# Patient Record
Sex: Female | Born: 1945
Health system: Southern US, Community
[De-identification: ages and names within clinical notes are randomized; demographics above are authoritative.]

## PROBLEM LIST (undated history)

## (undated) DIAGNOSIS — C4400 Unspecified malignant neoplasm of skin of lip: Secondary | ICD-10-CM

## (undated) DIAGNOSIS — Z86010 Personal history of colonic polyps: Principal | ICD-10-CM

## (undated) DIAGNOSIS — K589 Irritable bowel syndrome without diarrhea: Secondary | ICD-10-CM

## (undated) DIAGNOSIS — E785 Hyperlipidemia, unspecified: Secondary | ICD-10-CM

## (undated) DIAGNOSIS — N200 Calculus of kidney: Secondary | ICD-10-CM

## (undated) DIAGNOSIS — E119 Type 2 diabetes mellitus without complications: Secondary | ICD-10-CM

## (undated) DIAGNOSIS — M1990 Unspecified osteoarthritis, unspecified site: Secondary | ICD-10-CM

## (undated) DIAGNOSIS — T7840XA Allergy, unspecified, initial encounter: Secondary | ICD-10-CM

## (undated) DIAGNOSIS — I868 Varicose veins of other specified sites: Secondary | ICD-10-CM

## (undated) DIAGNOSIS — Z87442 Personal history of urinary calculi: Secondary | ICD-10-CM

## (undated) DIAGNOSIS — D649 Anemia, unspecified: Secondary | ICD-10-CM

## (undated) DIAGNOSIS — I10 Essential (primary) hypertension: Secondary | ICD-10-CM

## (undated) DIAGNOSIS — D219 Benign neoplasm of connective and other soft tissue, unspecified: Secondary | ICD-10-CM

## (undated) DIAGNOSIS — R0600 Dyspnea, unspecified: Secondary | ICD-10-CM

## (undated) DIAGNOSIS — K219 Gastro-esophageal reflux disease without esophagitis: Secondary | ICD-10-CM

## (undated) HISTORY — PX: TOOTH EXTRACTION: SUR596

## (undated) HISTORY — DX: Benign neoplasm of connective and other soft tissue, unspecified: D21.9

## (undated) HISTORY — DX: Personal history of colonic polyps: Z86.010

## (undated) HISTORY — DX: Irritable bowel syndrome, unspecified: K58.9

## (undated) HISTORY — DX: Allergy, unspecified, initial encounter: T78.40XA

## (undated) HISTORY — DX: Unspecified malignant neoplasm of skin of lip: C44.00

## (undated) HISTORY — DX: Calculus of kidney: N20.0

## (undated) HISTORY — DX: Gastro-esophageal reflux disease without esophagitis: K21.9

## (undated) HISTORY — DX: Essential (primary) hypertension: I10

## (undated) HISTORY — DX: Hyperlipidemia, unspecified: E78.5

## (undated) HISTORY — PX: TONSILLECTOMY: SUR1361

## (undated) HISTORY — DX: Varicose veins of other specified sites: I86.8

## (undated) HISTORY — DX: Type 2 diabetes mellitus without complications: E11.9

---

## 2000-01-02 HISTORY — PX: ENDOMETRIAL BIOPSY: SHX622

## 2003-10-02 ENCOUNTER — Other Ambulatory Visit: Admission: RE | Admit: 2003-10-02 | Discharge: 2003-10-02 | Payer: Self-pay | Admitting: Family Medicine

## 2004-04-11 ENCOUNTER — Ambulatory Visit: Payer: Self-pay | Admitting: Family Medicine

## 2004-04-18 ENCOUNTER — Ambulatory Visit: Payer: Self-pay | Admitting: Family Medicine

## 2004-05-23 ENCOUNTER — Ambulatory Visit: Payer: Self-pay | Admitting: Family Medicine

## 2004-06-23 ENCOUNTER — Ambulatory Visit: Payer: Self-pay | Admitting: Family Medicine

## 2004-07-22 ENCOUNTER — Ambulatory Visit: Payer: Self-pay | Admitting: Family Medicine

## 2005-02-03 ENCOUNTER — Ambulatory Visit: Payer: Self-pay | Admitting: Family Medicine

## 2005-09-05 ENCOUNTER — Ambulatory Visit: Payer: Self-pay | Admitting: Family Medicine

## 2005-09-07 ENCOUNTER — Ambulatory Visit: Payer: Self-pay | Admitting: Family Medicine

## 2005-11-06 ENCOUNTER — Encounter: Payer: Self-pay | Admitting: Family Medicine

## 2005-11-06 ENCOUNTER — Ambulatory Visit: Payer: Self-pay | Admitting: Family Medicine

## 2005-11-06 ENCOUNTER — Other Ambulatory Visit: Admission: RE | Admit: 2005-11-06 | Discharge: 2005-11-06 | Payer: Self-pay | Admitting: Family Medicine

## 2005-11-12 ENCOUNTER — Encounter: Payer: Self-pay | Admitting: Family Medicine

## 2005-11-12 LAB — CONVERTED CEMR LAB: Pap Smear: NORMAL

## 2005-11-28 ENCOUNTER — Ambulatory Visit: Payer: Self-pay | Admitting: Family Medicine

## 2005-12-02 HISTORY — PX: COLONOSCOPY: SHX174

## 2005-12-12 ENCOUNTER — Ambulatory Visit: Payer: Self-pay | Admitting: Internal Medicine

## 2005-12-19 ENCOUNTER — Ambulatory Visit: Payer: Self-pay | Admitting: Family Medicine

## 2005-12-20 ENCOUNTER — Ambulatory Visit: Payer: Self-pay | Admitting: Family Medicine

## 2005-12-22 ENCOUNTER — Ambulatory Visit: Payer: Self-pay | Admitting: Family Medicine

## 2005-12-26 ENCOUNTER — Ambulatory Visit: Payer: Self-pay | Admitting: Internal Medicine

## 2005-12-26 LAB — HM COLONOSCOPY: HM Colonoscopy: NORMAL

## 2006-02-20 ENCOUNTER — Ambulatory Visit: Payer: Self-pay | Admitting: Family Medicine

## 2006-06-20 ENCOUNTER — Encounter: Payer: Self-pay | Admitting: Family Medicine

## 2006-07-12 ENCOUNTER — Encounter: Payer: Self-pay | Admitting: Family Medicine

## 2006-07-12 DIAGNOSIS — I868 Varicose veins of other specified sites: Secondary | ICD-10-CM | POA: Insufficient documentation

## 2006-07-12 DIAGNOSIS — K219 Gastro-esophageal reflux disease without esophagitis: Secondary | ICD-10-CM

## 2006-07-12 DIAGNOSIS — I10 Essential (primary) hypertension: Secondary | ICD-10-CM | POA: Insufficient documentation

## 2006-07-12 DIAGNOSIS — J309 Allergic rhinitis, unspecified: Secondary | ICD-10-CM | POA: Insufficient documentation

## 2006-07-12 DIAGNOSIS — K589 Irritable bowel syndrome without diarrhea: Secondary | ICD-10-CM

## 2006-07-12 DIAGNOSIS — E785 Hyperlipidemia, unspecified: Secondary | ICD-10-CM | POA: Insufficient documentation

## 2006-09-07 ENCOUNTER — Encounter: Payer: Self-pay | Admitting: Family Medicine

## 2006-09-10 ENCOUNTER — Encounter: Payer: Self-pay | Admitting: Family Medicine

## 2006-09-10 ENCOUNTER — Ambulatory Visit: Payer: Self-pay | Admitting: Family Medicine

## 2006-10-23 ENCOUNTER — Telehealth: Payer: Self-pay | Admitting: Family Medicine

## 2006-10-23 ENCOUNTER — Ambulatory Visit: Payer: Self-pay | Admitting: Family Medicine

## 2006-10-23 LAB — CONVERTED CEMR LAB
Bacteria, UA: 0
Casts: 0 /lpf
Glucose, Urine, Semiquant: NEGATIVE
Ketones, urine, test strip: NEGATIVE
Mucus, UA: 0
Nitrite: NEGATIVE
Protein, U semiquant: NEGATIVE
RBC / HPF: 1
Specific Gravity, Urine: <1.005
Urobilinogen, UA: 0.2
WBC, UA: 0 cells/hpf
Yeast, UA: 0
pH: 6

## 2006-12-04 ENCOUNTER — Ambulatory Visit: Payer: Self-pay | Admitting: Family Medicine

## 2006-12-04 DIAGNOSIS — E1165 Type 2 diabetes mellitus with hyperglycemia: Secondary | ICD-10-CM | POA: Insufficient documentation

## 2006-12-04 DIAGNOSIS — E119 Type 2 diabetes mellitus without complications: Secondary | ICD-10-CM | POA: Insufficient documentation

## 2006-12-05 ENCOUNTER — Ambulatory Visit: Payer: Self-pay | Admitting: Family Medicine

## 2006-12-10 LAB — CONVERTED CEMR LAB
ALT: 20 units/L (ref 0–35)
AST: 16 units/L (ref 0–37)
Albumin: 3.7 g/dL (ref 3.5–5.2)
Basophils Absolute: 0 10*3/uL (ref 0.0–0.1)
Calcium: 9 mg/dL (ref 8.4–10.5)
Chloride: 106 meq/L (ref 96–112)
Cholesterol: 166 mg/dL (ref 0–200)
Creatinine,U: 50.8 mg/dL
Eosinophils Absolute: 0.2 10*3/uL (ref 0.0–0.6)
GFR calc Af Amer: 94 mL/min
GFR calc non Af Amer: 78 mL/min
HDL: 46.8 mg/dL (ref >39.0)
Hgb A1c MFr Bld: 7.4 % — ABNORMAL HIGH (ref 4.6–6.0)
MCHC: 34.1 g/dL (ref 30.0–36.0)
MCV: 87 fL (ref 78.0–100.0)
Microalb Creat Ratio: 13.8 mg/g (ref 0.0–30.0)
Neutro Abs: 4.5 10*3/uL (ref 1.4–7.7)
Platelets: 335 10*3/uL (ref 150–400)
RBC: 4.4 M/uL (ref 3.87–5.11)
Sodium: 141 meq/L (ref 135–145)
TSH: 1.42 microintl units/mL (ref 0.35–5.50)
Total CHOL/HDL Ratio: 3.5
Triglycerides: 114 mg/dL (ref 0–149)

## 2007-02-05 ENCOUNTER — Ambulatory Visit: Payer: Self-pay | Admitting: Family Medicine

## 2007-05-06 ENCOUNTER — Ambulatory Visit: Payer: Self-pay | Admitting: Family Medicine

## 2007-05-07 ENCOUNTER — Telehealth (INDEPENDENT_AMBULATORY_CARE_PROVIDER_SITE_OTHER): Payer: Self-pay | Admitting: Internal Medicine

## 2007-05-14 ENCOUNTER — Ambulatory Visit: Payer: Self-pay | Admitting: Family Medicine

## 2007-05-16 ENCOUNTER — Ambulatory Visit: Payer: Self-pay | Admitting: Family Medicine

## 2007-05-16 LAB — CONVERTED CEMR LAB
ALT: 21 units/L (ref 0–35)
AST: 20 units/L (ref 0–37)
HDL: 49 mg/dL (ref >39)
Hgb A1c MFr Bld: 7.6 % — ABNORMAL HIGH (ref 4.6–6.1)

## 2007-07-03 ENCOUNTER — Encounter (INDEPENDENT_AMBULATORY_CARE_PROVIDER_SITE_OTHER): Payer: Self-pay | Admitting: *Deleted

## 2007-08-14 ENCOUNTER — Ambulatory Visit: Payer: Self-pay | Admitting: Family Medicine

## 2007-08-16 LAB — CONVERTED CEMR LAB
ALT: 25 units/L (ref 0–35)
AST: 20 units/L (ref 0–37)
Cholesterol: 178 mg/dL (ref 0–200)
LDL Cholesterol: 113 mg/dL — ABNORMAL HIGH (ref 0–99)
Triglycerides: 97 mg/dL (ref 0–149)

## 2007-08-28 ENCOUNTER — Ambulatory Visit: Payer: Self-pay | Admitting: Family Medicine

## 2007-10-16 ENCOUNTER — Ambulatory Visit: Payer: Self-pay | Admitting: Family Medicine

## 2007-10-21 LAB — CONVERTED CEMR LAB
Albumin: 4.5 g/dL (ref 3.5–5.2)
BUN: 21 mg/dL (ref 6–23)
CO2: 21 meq/L (ref 19–32)
Calcium: 9.4 mg/dL (ref 8.4–10.5)
Cholesterol: 164 mg/dL (ref 0–200)
Creatinine, Ser: 0.72 mg/dL (ref 0.40–1.20)
Glucose, Bld: 163 mg/dL — ABNORMAL HIGH (ref 70–99)
HDL: 48 mg/dL (ref >39)
Total CHOL/HDL Ratio: 3.4
Triglycerides: 106 mg/dL (ref <150)

## 2007-11-27 ENCOUNTER — Ambulatory Visit: Payer: Self-pay | Admitting: Family Medicine

## 2007-11-28 ENCOUNTER — Ambulatory Visit: Payer: Self-pay | Admitting: Family Medicine

## 2007-11-28 ENCOUNTER — Encounter: Payer: Self-pay | Admitting: Family Medicine

## 2007-12-02 ENCOUNTER — Encounter (INDEPENDENT_AMBULATORY_CARE_PROVIDER_SITE_OTHER): Payer: Self-pay | Admitting: *Deleted

## 2007-12-30 ENCOUNTER — Ambulatory Visit: Payer: Self-pay | Admitting: Family Medicine

## 2008-02-12 LAB — CONVERTED CEMR LAB
Albumin: 3.9 g/dL (ref 3.5–5.2)
CO2: 27 meq/L (ref 19–32)
Calcium: 9.3 mg/dL (ref 8.4–10.5)
Creatinine, Ser: 0.7 mg/dL (ref 0.4–1.2)
GFR calc Af Amer: 109 mL/min
GFR calc non Af Amer: 90 mL/min
HDL: 43.4 mg/dL (ref >39.0)
Sodium: 139 meq/L (ref 135–145)
Total CHOL/HDL Ratio: 3.5
Triglycerides: 86 mg/dL (ref 0–149)
VLDL: 17 mg/dL (ref 0–40)

## 2008-02-13 ENCOUNTER — Ambulatory Visit: Payer: Self-pay | Admitting: Family Medicine

## 2008-02-14 LAB — CONVERTED CEMR LAB
BUN: 18 mg/dL (ref 6–23)
Calcium: 9.3 mg/dL (ref 8.4–10.5)
Chloride: 105 meq/L (ref 96–112)
Cholesterol: 151 mg/dL (ref 0–200)
Creatinine, Ser: 0.6 mg/dL (ref 0.4–1.2)
GFR calc Af Amer: 131 mL/min
LDL Cholesterol: 90 mg/dL (ref 0–99)
Phosphorus: 4 mg/dL (ref 2.3–4.6)
Potassium: 4.4 meq/L (ref 3.5–5.1)
Total CHOL/HDL Ratio: 3.5
Triglycerides: 86 mg/dL (ref 0–149)
VLDL: 17 mg/dL (ref 0–40)

## 2008-02-20 ENCOUNTER — Other Ambulatory Visit: Admission: RE | Admit: 2008-02-20 | Discharge: 2008-02-20 | Payer: Self-pay | Admitting: Family Medicine

## 2008-02-20 ENCOUNTER — Ambulatory Visit: Payer: Self-pay | Admitting: Family Medicine

## 2008-02-20 ENCOUNTER — Encounter: Payer: Self-pay | Admitting: Family Medicine

## 2008-04-24 ENCOUNTER — Ambulatory Visit: Payer: Self-pay | Admitting: Family Medicine

## 2008-06-16 ENCOUNTER — Ambulatory Visit: Payer: Self-pay | Admitting: Family Medicine

## 2008-06-16 DIAGNOSIS — J019 Acute sinusitis, unspecified: Secondary | ICD-10-CM

## 2008-07-01 ENCOUNTER — Telehealth: Payer: Self-pay | Admitting: Family Medicine

## 2008-07-28 ENCOUNTER — Encounter: Payer: Self-pay | Admitting: Family Medicine

## 2008-10-13 ENCOUNTER — Ambulatory Visit: Payer: Self-pay | Admitting: Family Medicine

## 2008-12-08 ENCOUNTER — Ambulatory Visit: Payer: Self-pay | Admitting: Family Medicine

## 2008-12-08 ENCOUNTER — Encounter: Payer: Self-pay | Admitting: Family Medicine

## 2008-12-10 ENCOUNTER — Encounter (INDEPENDENT_AMBULATORY_CARE_PROVIDER_SITE_OTHER): Payer: Self-pay | Admitting: *Deleted

## 2008-12-18 ENCOUNTER — Encounter: Payer: Self-pay | Admitting: Family Medicine

## 2008-12-24 ENCOUNTER — Ambulatory Visit: Payer: Self-pay | Admitting: Family Medicine

## 2008-12-24 LAB — CONVERTED CEMR LAB
ALT: 22 units/L (ref 0–35)
BUN: 18 mg/dL (ref 6–23)
Creatinine, Ser: 0.7 mg/dL (ref 0.4–1.2)
GFR calc non Af Amer: 90.1 mL/min (ref >60)
Glucose, Bld: 156 mg/dL — ABNORMAL HIGH (ref 70–99)
HDL: 45.8 mg/dL (ref >39.00)
LDL Cholesterol: 68 mg/dL (ref 0–99)
Phosphorus: 4.4 mg/dL (ref 2.3–4.6)
Sodium: 142 meq/L (ref 135–145)
Total CHOL/HDL Ratio: 3
Triglycerides: 90 mg/dL (ref 0.0–149.0)
VLDL: 18 mg/dL (ref 0.0–40.0)

## 2009-01-05 ENCOUNTER — Ambulatory Visit: Payer: Self-pay | Admitting: Family Medicine

## 2009-03-02 ENCOUNTER — Encounter: Payer: Self-pay | Admitting: Family Medicine

## 2009-10-13 ENCOUNTER — Encounter: Payer: Self-pay | Admitting: Family Medicine

## 2009-10-14 ENCOUNTER — Encounter: Payer: Self-pay | Admitting: Family Medicine

## 2009-11-17 ENCOUNTER — Ambulatory Visit: Payer: Self-pay | Admitting: Family Medicine

## 2009-12-16 ENCOUNTER — Ambulatory Visit: Payer: Self-pay | Admitting: Family Medicine

## 2009-12-16 ENCOUNTER — Encounter: Payer: Self-pay | Admitting: Family Medicine

## 2009-12-16 LAB — HM MAMMOGRAPHY: HM Mammogram: NORMAL

## 2009-12-27 ENCOUNTER — Encounter: Payer: Self-pay | Admitting: Family Medicine

## 2010-02-21 ENCOUNTER — Ambulatory Visit: Payer: Self-pay | Admitting: Internal Medicine

## 2010-05-04 NOTE — Assessment & Plan Note (Signed)
Summary: SINUS INF/DLO   Vital Signs:  Patient profile:   65 year old female Weight:      152.50 pounds Temp:     98.3 degrees F p Pulse rate:   88 / minute Pulse rhythm:   regular BP sitting:   138 / 80  (left arm) Cuff size:   regular  Vitals Entered By: Selena Batten Dance CMA Duncan Dull) (February 21, 2010 2:46 PM) CC: ? Sinus infection   History of Present Illness: CC: ST, sinus inf?  7 d h/o ST, sinus sxs on and off again.  Very congested, in am using neti pot and only draining small amt mucous.  Ears hurt, sinus pressure headache. + body aches.  Ibuprofen for body aches.  yesterday Tmax 99 with mild nausea.  No fevers/chills, abd pain, v/d, rash, myalgias.  To leave for thanksgiving.  Sunday prior to sxs, did lots of yard work, mulching leaves.  No sick contacts at home.  No smokers at home.  Current Medications (verified): 1)  Crestor 20 Mg  Tabs (Rosuvastatin Calcium) .[name].. 1 By Mouth Once Daily 2)  Nasalcrom 5.2 Mg/act  Aers (Cromolyn Sodium) .... 2 Sprays in Each Nostril Daily As Needed 3)  Glucotrol Xl 10 Mg  Tb24 (Glipizide) .[name].. 1 By Mouth Once Daily 4)  Onetouch Ultra Test  Strp (Glucose Blood) .... Check Blood Sugar Twice A Day As Directed 5)  Cvs Omeprazole 20 Mg  Tbec (Omeprazole) .... Take One By Mouth Daily 6)  Multivitamins  Tabs (Multiple Vitamin) .... One By Mouth Daily 7)  Aspirin Adult Low Strength 81 Mg Tbec (Aspirin) .... One By Mouth Daily 8)  Fish Oil 1200 Mg Caps (Omega-3 Fatty Acids) .... Take Two Capsules By Mouth Daily 9)  Eq Allergy Relief 10 Mg Tabs (Loratadine) .... One By Mouth Daily As Needed 10)  Cozaar 50 Mg Tabs (Losartan Potassium) .... Take 1 Tablet By Mouth Once A Day 11)  Janumet 50-1000 Mg Tabs (Sitagliptin-Metformin Hcl) .... Take One Tablet By Mouth Twice A Day.  Allergies: 1)  ! Sulfa 2)  ! Cipro  Past History:  Past Medical History: Last updated: 10/13/2008 Allergic rhinitis GERD Hyperlipidemia Hypertension DM 2   endo- Dr  Talmage Nap  Social History: Last updated: 11/27/2007 non smoker no alcohol  walks for exercise also yardwork  Review of Systems       per HPI  Physical Exam  General:  Well-developed,well-nourished,in no acute distress; alert,appropriate and cooperative throughout examination Head:  normocephalic, atraumatic, and no abnormalities observed.   mild maxillary sinus tenderness bilaterally Eyes:  vision grossly intact, pupils equal, pupils round, and pupils reactive to light.   Ears:  fluid behind TMs bilaterally Nose:  nares are congested bilat  Mouth:  pharynx pink and moist.  no exudates Neck:  supple with full rom and no masses or thyromegally, no JVD or carotid bruit  Lungs:  Normal respiratory effort, chest expands symmetrically. Lungs are clear to auscultation, no crackles or wheezes. Heart:  Normal rate and regular rhythm. S1 and S2 normal without gallop, murmur, click, rub or other extra sounds. Pulses:  2+ rad pulses Extremities:  no pedal edema, brisk cap refill Skin:  Intact without suspicious lesions or rashes   Impression & Recommendations:  Problem # 1:  SINUSITIS - ACUTE-NOS (ICD-461.9) vs allergic congestion.  7 days into illness, likely viral for now.  currently no need for abx, start flonase, guaifenesin, continue neti pot.  abx script to hold on to incase not improving  given leaving town for weekend.  red flags to seek care discussed.  Her updated medication list for this problem includes:    Nasalcrom 5.2 Mg/act Aers (Cromolyn sodium) .[name]... 2 sprays in each nostril daily as needed    Amoxicillin 875 Mg Tabs (Amoxicillin) ..... One by mouth two times a day for sinus infection x 10 days    Flonase 50 Mcg/act Susp (Fluticasone propionate) .[name]... 2 squirts in each nostril daily  Complete Medication List: 1)  Crestor 20 Mg Tabs (Rosuvastatin calcium) .[name].. 1 by mouth once daily 2)  Nasalcrom 5.2 Mg/act Aers (Cromolyn sodium) .... 2 sprays in each nostril daily as  needed 3)  Glucotrol Xl 10 Mg Tb24 (Glipizide) .[name].. 1 by mouth once daily 4)  Onetouch Ultra Test Strp (Glucose blood) .... Check blood sugar twice a day as directed 5)  Cvs Omeprazole 20 Mg Tbec (Omeprazole) .... Take one by mouth daily 6)  Multivitamins Tabs (Multiple vitamin) .... One by mouth daily 7)  Aspirin Adult Low Strength 81 Mg Tbec (Aspirin) .... One by mouth daily 8)  Fish Oil 1200 Mg Caps (Omega-3 fatty acids) .... Take two capsules by mouth daily 9)  Eq Allergy Relief 10 Mg Tabs (Loratadine) .... One by mouth daily as needed 10)  Cozaar 50 Mg Tabs (Losartan potassium) .... Take 1 tablet by mouth once a day 11)  Janumet 50-1000 Mg Tabs (Sitagliptin-metformin hcl) .... Take one tablet by mouth twice a day. 12)  Amoxicillin 875 Mg Tabs (Amoxicillin) .... One by mouth two times a day for sinus infection x 10 days 13)  Flonase 50 Mcg/act Susp (Fluticasone propionate) .... 2 squirts in each nostril daily  Patient Instructions: 1)  You have sinus congestion, may be infection but most are viral. 2)  Take medicines as prescribed: flonase 2 squirts in each nostril daily.  abx script to hold on to. 3)  Take guaifenesin 400mg  IR 1 1/2 pills in am and at noon with plenty of fluid to help mobilize mucous. 4)  Use nasal saline spray or neti pot to help drainage of sinuses. 5)  If you start having fevers >101.5, trouble swallowing or breathing, or are worsening instead of improving as expected, you may need to be seen again. 6)  Good to see you today, call clinic with questions.  Prescriptions: FLONASE 50 MCG/ACT SUSP (FLUTICASONE PROPIONATE) 2 squirts in each nostril daily  #1 x 1   Entered and Authorized by:   Eustaquio Boyden  MD   Signed by:   Eustaquio Boyden  MD on 02/21/2010   Method used:   [facility]        [facility] [id number]       [address]       [place], [place]  [address]       Ph: [phone] or [phone]       Fax: [phone]   RxID:    [id number] AMOXICILLIN 875 MG TABS (AMOXICILLIN) one by mouth two times a day for sinus infection x 10 days  #20 x 0   Entered and Authorized by:   Eustaquio Boyden  MD   Signed by:   Eustaquio Boyden  MD on 02/21/2010   Method used:   Print then Give to Patient   RxID:   [id number]    Orders Added: 1)  Est. Patient Level III [32951]    Current Allergies (reviewed today): ! SULFA ! CIPRO

## 2010-05-04 NOTE — Letter (Signed)
Summary: Results Follow up Letter  [place] [facility]  [address] [place], [place] [address]   Phone: [phone]  Fax: [phone]    12/27/2009 MRN: [id number]    [facility] [place] [address] [place], [place]  [address]    Dear Ms. Friedli,  The following are the results of your recent test(s):  Test         Result    Pap Smear:        Normal _____  Not Normal _____ Comments: ______________________________________________________ Cholesterol: LDL(Bad cholesterol):         Your goal is less than:         HDL (Good cholesterol):       Your goal is more than: Comments:  ______________________________________________________ Mammogram:        Normal __X___  Not Normal _____ Comments:Repeat in one year.   ___________________________________________________________________ Hemoccult:        Normal _____  Not normal _______ Comments:    _____________________________________________________________________ Other Tests:    We routinely do not discuss normal results over the telephone.  If you desire a copy of the results, or you have any questions about this information we can discuss them at your next office visit.   Sincerely,    Idamae Schuller Tower,MD  MT/ri

## 2010-05-04 NOTE — Assessment & Plan Note (Signed)
Summary: 3 M F/U  DLO   Vital Signs:  Patient Profile:   65 Years Old Female Height:     64.25 inches (163.19 cm) Weight:      149 pounds Temp:     97.5 degrees F oral Pulse rate:   92 / minute Pulse rhythm:   regular BP sitting:   104 / 70  (left arm) Cuff size:   regular  Vitals Entered By: Lowella Petties (Aug 28, 2007 9:15 AM)                 Chief Complaint:  3 month follow up.  History of Present Illness: has been doing ok  much stress-- mom had mild MI, and buisness is bad  stress does not change her eating overall did have to eat out a bit more  last visit added glipizide once daily-- takes it at lunchtime  sugars have been ok-- better than last time (but they do fluctuate) in ams -- usually around 140 (occ higher) occ checking in evening--good if after she walks--112-120 if she does not walk??? what they are  is walking 45 minutes 3 days per week , or she cuts the grass   did not go up on the crestor yet-- so chol is still not in opt control  wt is up a little blood pressure is great avapro is not on ins list-- has not stopped it-- prefers benicar or miacardis (needs to switch) nexuim- switched to generic omeprazole- works fairly well      Current Allergies: ! SULFA ! CIPRO  Past Medical History:    Allergic rhinitis    GERD    Hyperlipidemia    Hypertension    DM 2       Past Surgical History:    Reviewed history from 07/12/2006 and no changes required:       Tonsillectomy       Heel dexa (2000)       Colonoscopy- polyp (2956),  colonoscopy- neg. (1999)       Uterine fibroids- cysts on [place] (02/2000)       Endometrial biopsy- neg.- (01/2000)       Colonoscopy- neg.  (12/2005)   Family History:    Reviewed history from 12/04/2006 and no changes required:       mother CAD, chol, HTN, OP       father DM, CHF, HTN, TIA       GF DM       B HTN       GM ?OP       son- heart M  Social History:    non smoker    no alcohol       Physical Exam  General:     Well-developed,well-nourished,in no acute distress; alert,appropriate and cooperative throughout examination Head:     normocephalic, atraumatic, and no abnormalities observed.   Eyes:     vision grossly intact, pupils equal, pupils round, and pupils reactive to light.   Neck:     supple with full rom and no masses or thyromegally, no JVD or carotid bruit  Lungs:     Normal respiratory effort, chest expands symmetrically. Lungs are clear to auscultation, no crackles or wheezes. Heart:     Normal rate and regular rhythm. S1 and S2 normal without gallop, murmur, click, rub or other extra sounds. Pulses:     R and L carotid,radial,femoral,dorsalis pedis and posterior tibial pulses are full and equal bilaterally Extremities:  No clubbing, cyanosis, edema, or deformity noted with normal full range of motion of all joints.   Neurologic:     sensation intact to light touch, gait normal, and DTRs symmetrical and normal.   Skin:     Intact without suspicious lesions or rashes Cervical Nodes:     No lymphadenopathy noted Psych:     nl affect, pleasant     Impression & Recommendations:  Problem # 1:  DM, UNCOMPLICATED, TYPE II (ICD-250.00) Assessment: Improved improving control- still not at goal will continue diet and exercise  plan to inc glucotrol from 5 to 10 and update f/u 3 mo Her updated medication list for this problem includes:    Metformin Hcl 1000 Mg Tabs (Metformin hcl) .[name]... 1 by mouth bid    Benicar 40 Mg Tabs (Olmesartan medoxomil) .[name]... 1 by mouth once daily    Glucotrol Xl 10 Mg Tb24 (Glipizide) .[name]... 1 by mouth once daily  Labs Reviewed: HgBA1c: 7.0 (08/14/2007)   Creat: 0.8 (12/05/2006)      Problem # 2:  HYPERTENSION (ICD-401.9) Assessment: Unchanged switching avapro to benica rfor cost issues  adv to update if any probs or side eff (not expected) renal prof 6 weeks f/u 3 mo Her updated medication list for this  problem includes:    Benicar 40 Mg Tabs (Olmesartan medoxomil) .[name]... 1 by mouth once daily  BP today: 104/70 Prior BP: 120/68 (05/16/2007)  Labs Reviewed: Creat: 0.8 (12/05/2006) Chol: 178 (08/14/2007)   HDL: 45.2 (08/14/2007)   LDL: 113 (08/14/2007)   TG: 97 (08/14/2007)   Problem # 3:  HYPERLIPIDEMIA (ICD-272.4) Assessment: Unchanged increase crestor to 20 mg and good diet  labs 6 weeks, f/u 3 mo Her updated medication list for this problem includes:    Crestor 20 Mg Tabs (Rosuvastatin calcium) .[name]... 1 by mouth qd  Labs Reviewed: Chol: 178 (08/14/2007)   HDL: 45.2 (08/14/2007)   LDL: 113 (08/14/2007)   TG: 97 (08/14/2007) SGOT: 20 (08/14/2007)   SGPT: 25 (08/14/2007)   Complete Medication List: 1)  Metformin Hcl 1000 Mg Tabs (Metformin hcl) .[name].. 1 by mouth bid 2)  Crestor 20 Mg Tabs (Rosuvastatin calcium) .[name].. 1 by mouth qd 3)  Benicar 40 Mg Tabs (Olmesartan medoxomil) .[name].. 1 by mouth once daily 4)  Nasalcrom 5.2 Mg/act Aers (Cromolyn sodium) .... 2 sprays in each nostril daily 5)  Glucotrol Xl 10 Mg Tb24 (Glipizide) .[name].. 1 by mouth once daily 6)  Accu-chek Compact Strp (Glucose blood) .... Test blood sugar bid 7)  Cvs Omeprazole 20 Mg Tbec (Omeprazole) .... Take one by mouth daily   Patient Instructions: 1)  try to stick to diabetic diet 2)  aim for exercise 5 days per week 3)  go up on crestor to 20 mg daily- update if problems or side effects 4)  if no problems- after 2 weeks- increase your glucotrol to 10 mg daily  5)  if any low sugar below 80 (consistently)- update me  6)  fasting labs in 6 weeks for cholesterol (lipid/ast/alt /renal 272) , and then follow up with me in 3 months approx  7)  switch avapro to benicar    Prescriptions: BENICAR 40 MG  TABS (OLMESARTAN MEDOXOMIL) 1 by mouth once daily  #30 x 3   Entered and Authorized by:   Judith Part MD   Signed by:   Judith Part MD on 08/28/2007   Method used:   Print then Give to Patient   RxID:  [id number] GLUCOTROL XL 10 MG  TB24 (GLIPIZIDE) 1 by mouth once daily  #30 x 3   Entered and Authorized by:   Judith Part MD   Signed by:   Judith Part MD on 08/28/2007   Method used:   Print then Give to Patient   RxID:   [id number]  ] Prior Medications: METFORMIN HCL 1000 MG  TABS (METFORMIN HCL) 1 by mouth bid CRESTOR 20 MG  TABS (ROSUVASTATIN CALCIUM) 1 by mouth qd BENICAR 40 MG  TABS (OLMESARTAN MEDOXOMIL) 1 by mouth once daily NASALCROM 5.2 MG/ACT  AERS (CROMOLYN SODIUM) 2 sprays in each nostril daily GLUCOTROL XL 10 MG  TB24 (GLIPIZIDE) 1 by mouth once daily ACCU-CHEK COMPACT   STRP (GLUCOSE BLOOD) test blood sugar bid [facility] OMEPRAZOLE 20 MG  TBEC (OMEPRAZOLE) take one by mouth daily Current Allergies: ! SULFA ! CIPRO

## 2010-05-04 NOTE — Letter (Signed)
Summary: [facility]Note  Dr.Bindubal Balan,[facility]Note   Imported By: Beau Fanny 12/23/2009 08:58:21  _____________________________________________________________________  External Attachment:    Type:   Image     Comment:   External Document

## 2010-05-04 NOTE — Assessment & Plan Note (Signed)
Summary: FOLLOW UP   Vital Signs:  Patient profile:   65 year old female Height:      63 inches Weight:      150.25 pounds BMI:     26.71 Temp:     98.2 degrees F oral Pulse rate:   88 / minute Pulse rhythm:   regular BP sitting:   132 / 76  (left arm) Cuff size:   regular  Vitals Entered By: Lewanda Rife LPN (November 17, 2009 10:16 AM)  Serial Vital Signs/Assessments:  Time      Position  BP       Pulse  Resp  Temp     By                     122/80                         Judith Part MD  CC: follow-up visit   History of Present Illness: here for f/u of lipids and HTN   is doing fine   sees endo for her DM-- last AIC in fall was 6.4- doing better went back in july and it was up (due to a tooth that needs to be pulled)    wt is up 2 lb with bmi of 26  bp is 132/76 today- has been stable  due to check lipids on statin and diet  thinks this was just checked from Dr Talmage Nap   is eating healthy - sticks to DM plan  portion control is her biggest issue  sometimes hard to remember to take both doses of janumet -- is trying to be better about that  is really hard to eat out and at parties   opthy- was january , no DM retinop --[facility]  PTX -- has not had      Allergies: 1)  ! Sulfa 2)  ! Cipro  Past History:  Past Medical History: Last updated: 10/13/2008 Allergic rhinitis GERD Hyperlipidemia Hypertension DM 2   endo- Dr Talmage Nap  Past Surgical History: Last updated: 02/20/2008 Tonsillectomy Heel dexa (2000)- normal  Colonoscopy- polyp (1993),  colonoscopy- neg. (1999) Uterine fibroids- cysts on [place] (02/2000) Endometrial biopsy- neg.- (01/2000) Colonoscopy- neg.  (12/2005)  Family History: Last updated: 02/20/2008 mother CAD, chol, HTN, OP, MI in 09 (CABG in her 54s) father DM, CHF, HTN, TIA GF DM B HTN GM ?OP son- heart M  Social History: Last updated: 11/27/2007 non smoker no alcohol  walks for exercise also  yardwork  Risk Factors: Smoking Status: never (07/12/2006)  Review of Systems General:  Denies fatigue, loss of appetite, and malaise. Eyes:  Denies blurring and eye irritation. CV:  Denies chest pain or discomfort, lightheadness, and palpitations. Resp:  Denies cough and shortness of breath. GI:  Denies abdominal pain, indigestion, and nausea. GU:  Denies urinary frequency. MS:  Denies joint pain, joint redness, and joint swelling. Derm:  Denies itching, lesion(s), poor wound healing, and rash. Neuro:  Denies numbness and tingling. Psych:  Denies anxiety and depression. Endo:  Denies excessive thirst and excessive urination. Heme:  Denies abnormal bruising and bleeding.  Physical Exam  General:  Well-developed,well-nourished,in no acute distress; alert,appropriate and cooperative throughout examination Head:  normocephalic, atraumatic, and no abnormalities observed.   Eyes:  vision grossly intact, pupils equal, pupils round, and pupils reactive to light.   Mouth:  pharynx pink and moist.   Neck:  supple with full rom  and no masses or thyromegally, no JVD or carotid bruit  Lungs:  Normal respiratory effort, chest expands symmetrically. Lungs are clear to auscultation, no crackles or wheezes. Heart:  Normal rate and regular rhythm. S1 and S2 normal without gallop, murmur, click, rub or other extra sounds. Abdomen:  soft, non-tender, and normal bowel sounds.  no renal bruits  Msk:  No deformity or scoliosis noted of thoracic or lumbar spine.  no acute joint changes Pulses:  R and L carotid,radial,femoral,dorsalis pedis and posterior tibial pulses are full and equal bilaterally Extremities:  No clubbing, cyanosis, edema, or deformity noted with normal full range of motion of all joints.   Neurologic:  sensation intact to light touch, gait normal, and DTRs symmetrical and normal.   Skin:  Intact without suspicious lesions or rashes Cervical Nodes:  No lymphadenopathy noted Inguinal  Nodes:  No significant adenopathy Psych:  normal affect, talkative and pleasant   Diabetes Management Exam:    Foot Exam (with socks and/or shoes not present):       Sensory-Pinprick/Light touch:          Left medial foot (L-4): normal          Left dorsal foot (L-5): normal          Left lateral foot (S-1): normal          Right medial foot (L-4): normal          Right dorsal foot (L-5): normal          Right lateral foot (S-1): normal       Sensory-Monofilament:          Left foot: normal          Right foot: normal       Inspection:          Left foot: normal          Right foot: normal       Nails:          Left foot: normal          Right foot: normal    Eye Exam:       Eye Exam done elsewhere          Date: 04/16/2009          Results: normal          Done by: [facility]   Impression & Recommendations:  Problem # 1:  DM, UNCOMPLICATED, TYPE II (ICD-250.00) Assessment Improved  overall doing better send for info from Dr Talmage Nap disc healthy diet (low simple sugar/ choose complex carbs/ low sat fat) diet and exercise in detail  utd opthy and good foot care pneumovax today The following medications were removed from the medication list:    Metformin Hcl 1000 Mg Tabs (Metformin hcl) .[name]... 1 by mouth two times a day Her updated medication list for this problem includes:    Glucotrol Xl 10 Mg Tb24 (Glipizide) .[name]... 1 by mouth once daily    Aspirin Adult Low Strength 81 Mg Tbec (Aspirin) ..... One by mouth daily    Cozaar 50 Mg Tabs (Losartan potassium) .[name]... Take 1 tablet by mouth once a day    Janumet 50-1000 Mg Tabs (Sitagliptin-metformin hcl) .[name]... Take one tablet by mouth twice a day.  Orders: Prescription Created Electronically [phone])  Problem # 2:  HYPERTENSION (ICD-401.9) Assessment: Unchanged  this is well controlled with arb- no problems  sent for endo labs and update Her updated medication list  for this problem includes:    Cozaar 50 Mg Tabs  (Losartan potassium) .[name]... Take 1 tablet by mouth once a day  BP today: 132/76 Prior BP: 120/80 (10/13/2008)  Labs Reviewed: K+: 4.3 (12/24/2008) Creat: : 0.7 (12/24/2008)   Chol: 132 (12/24/2008)   HDL: 45.80 (12/24/2008)   LDL: 68 (12/24/2008)   TG: 90.0 (12/24/2008)  Orders: Prescription Created Electronically [id number])  Problem # 3:  HYPERLIPIDEMIA (ICD-272.4) Assessment: Unchanged  sent for lab from Dr Talmage Nap per pt well controlled doing well with low sat fat diet- reviewed Her updated medication list for this problem includes:    Crestor 20 Mg Tabs (Rosuvastatin calcium) .[name]... 1 by mouth once daily  Labs Reviewed: SGOT: 20 (12/24/2008)   SGPT: 22 (12/24/2008)   HDL:45.80 (12/24/2008), 43.6 (02/13/2008)  LDL:68 (12/24/2008), 90 (02/13/2008)  Chol:132 (12/24/2008), 151 (02/13/2008)  Trig:90.0 (12/24/2008), 86 (02/13/2008)  Orders: Prescription Created Electronically [id number])  Complete Medication List: 1)  Crestor 20 Mg Tabs (Rosuvastatin calcium) .[name].. 1 by mouth once daily 2)  Nasalcrom 5.2 Mg/act Aers (Cromolyn sodium) .... 2 sprays in each nostril daily as needed 3)  Glucotrol Xl 10 Mg Tb24 (Glipizide) .[name].. 1 by mouth once daily 4)  Onetouch Ultra Test Strp (Glucose blood) .... Check blood sugar twice a day as directed 5)  [facility]vs Omeprazole 20 Mg Tbec (Omeprazole) .... Take one by mouth daily 6)  Multivitamins Tabs (Multiple vitamin) .... One by mouth daily 7)  Aspirin Adult Low Strength 81 Mg Tbec (Aspirin) .... One by mouth daily 8)  Fish Oil 1200 Mg Caps (Omega-3 fatty acids) .... Take two capsules by mouth daily 9)  Eq Allergy Relief 10 Mg Tabs (Loratadine) .... One by mouth daily as needed 10)  Cozaar 50 Mg Tabs (Losartan potassium) .... Take 1 tablet by mouth once a day 11)  Janumet 50-1000 Mg Tabs (Sitagliptin-metformin hcl) .... Take one tablet by mouth twice a day.  Other Orders: Pneumococcal Vaccine ([address]) Admin 1st Vaccine (91478) Radiology Referral  (Radiology)  Patient Instructions: 1)  please send for last labs from Dr Talmage Nap -- and note from july  2)  keep up the good work with diet and exercise  3)  if we need to draw labs I will contact you  4)  pneumonia vaccine today 5)  don't forget to get a flu shot in the fall 6)  we will schedule your mammogram at check out Prescriptions: COZAAR 50 MG TABS (LOSARTAN POTASSIUM) Take 1 tablet by mouth once a day  #30 x 11   Entered and Authorized by:   Judith Part MD   Signed by:   Judith Part MD on 11/17/2009   Method used:   [facility]        [facility] [facility]       [address]       [place], [place]  [address]       Ph: [phone] or [phone]       Fax: [phone]   RxID:   [id number] CRESTOR 20 MG  TABS (ROSUVASTATIN CALCIUM) 1 by mouth once daily  #30 x 11   Entered and Authorized by:   Judith Part MD   Signed by:   Judith Part MD on 11/17/2009   Method used:   [facility]        [phone]ail[facility]       [address]       [place], [place]  [address]       Ph: [phone] or [phone]       Fax: [phone]   RxID:   [id number]   Current Allergies (reviewed today): ! SULFA ! CIPRO    Immunizations Administered:  Pneumonia Vaccine:    Vaccine Type: Pneumovax    Site: left deltoid    Mfr: Merck    Dose: 0.5 ml    Route: IM    Given by: Lewanda Rife LPN    Exp. Date: 04/20/2011    Lot #: [id number]    VIS given: 10/30/95 version given November 17, 2009.

## 2010-09-14 ENCOUNTER — Other Ambulatory Visit: Payer: Self-pay | Admitting: Family Medicine

## 2010-09-14 NOTE — Telephone Encounter (Signed)
Pt needs to call for appt. 

## 2010-09-15 ENCOUNTER — Other Ambulatory Visit: Payer: Self-pay | Admitting: *Deleted

## 2010-09-15 MED ORDER — GLUCOSE BLOOD VI STRP: ORAL_STRIP | Status: DC

## 2010-11-18 ENCOUNTER — Other Ambulatory Visit: Payer: Self-pay

## 2010-11-18 MED ORDER — LOSARTAN POTASSIUM 50 MG PO TABS: 50.0000 mg | ORAL_TABLET | Freq: Every day | ORAL | Status: DC

## 2010-11-18 NOTE — Telephone Encounter (Signed)
[facility] faxed refill request for Losartan Potassium 50 mg #30 x 1 with note pt needs to call for appt.

## 2010-11-30 ENCOUNTER — Other Ambulatory Visit: Payer: Self-pay

## 2010-11-30 MED ORDER — ROSUVASTATIN CALCIUM 20 MG PO TABS: 20.0000 mg | ORAL_TABLET | Freq: Every day | ORAL | Status: DC

## 2010-11-30 NOTE — Telephone Encounter (Signed)
[facility] faxed refill request Crestor 20mg  #30 x 0 with note pt needs to call for appt.

## 2010-12-01 ENCOUNTER — Other Ambulatory Visit: Payer: Self-pay | Admitting: *Deleted

## 2010-12-01 MED ORDER — ROSUVASTATIN CALCIUM 20 MG PO TABS: 20.0000 mg | ORAL_TABLET | Freq: Every day | ORAL | Status: DC

## 2010-12-08 ENCOUNTER — Other Ambulatory Visit: Payer: Self-pay | Admitting: Family Medicine

## 2010-12-08 NOTE — Telephone Encounter (Signed)
[facility] electronically request refill Glipizide 10mg  24 hr tab # 90 x 0 with note attached pt needs to call for appt.

## 2011-01-02 ENCOUNTER — Other Ambulatory Visit: Payer: Self-pay | Admitting: Family Medicine

## 2011-01-15 ENCOUNTER — Other Ambulatory Visit: Payer: Self-pay | Admitting: Family Medicine

## 2011-01-17 ENCOUNTER — Other Ambulatory Visit: Payer: Self-pay | Admitting: *Deleted

## 2011-01-20 ENCOUNTER — Encounter: Payer: Self-pay | Admitting: Internal Medicine

## 2011-01-29 ENCOUNTER — Other Ambulatory Visit: Payer: Self-pay | Admitting: Family Medicine

## 2011-01-30 NOTE — Telephone Encounter (Signed)
Patient advised by telephone that she is overdue to be seen and have lab work. Patient stated that she is driving now and will have to check her schedule and call back and schedule an appointment. Advised patient that once an appointment is scheduled medication can be refilled to last her until she is seen.

## 2011-02-07 ENCOUNTER — Ambulatory Visit: Payer: Self-pay | Admitting: Family Medicine

## 2011-02-07 ENCOUNTER — Encounter: Payer: Self-pay | Admitting: Family Medicine

## 2011-02-08 ENCOUNTER — Ambulatory Visit (INDEPENDENT_AMBULATORY_CARE_PROVIDER_SITE_OTHER): Payer: PRIVATE HEALTH INSURANCE | Admitting: Family Medicine

## 2011-02-08 ENCOUNTER — Encounter: Payer: Self-pay | Admitting: Family Medicine

## 2011-02-08 VITALS — BP 140/84 | HR 80 | Temp 98.2°F | Ht 63.0 in | Wt 150.2 lb

## 2011-02-08 DIAGNOSIS — J019 Acute sinusitis, unspecified: Secondary | ICD-10-CM

## 2011-02-08 DIAGNOSIS — E119 Type 2 diabetes mellitus without complications: Secondary | ICD-10-CM

## 2011-02-08 DIAGNOSIS — E785 Hyperlipidemia, unspecified: Secondary | ICD-10-CM

## 2011-02-08 DIAGNOSIS — I10 Essential (primary) hypertension: Secondary | ICD-10-CM

## 2011-02-08 LAB — TSH: TSH: 0.67 u[IU]/mL (ref 0.35–5.50)

## 2011-02-08 LAB — COMPREHENSIVE METABOLIC PANEL
AST: 24 U/L (ref 0–37)
Albumin: 4.2 g/dL (ref 3.5–5.2)
Alkaline Phosphatase: 55 U/L (ref 39–117)
BUN: 23 mg/dL (ref 6–23)
Potassium: 4.6 mEq/L (ref 3.5–5.1)
Sodium: 141 mEq/L (ref 135–145)
Total Bilirubin: 0.6 mg/dL (ref 0.3–1.2)

## 2011-02-08 LAB — CBC WITH DIFFERENTIAL/PLATELET
Basophils Relative: 0.5 % (ref 0.0–3.0)
Eosinophils Absolute: 0.1 10*3/uL (ref 0.0–0.7)
HCT: 40.9 % (ref 36.0–46.0)
Lymphs Abs: 2.5 10*3/uL (ref 0.7–4.0)
MCHC: 33.4 g/dL (ref 30.0–36.0)
MCV: 88.4 fl (ref 78.0–100.0)
Monocytes Absolute: 0.4 10*3/uL (ref 0.1–1.0)
Neutrophils Relative %: 58.1 % (ref 43.0–77.0)
Platelets: 303 10*3/uL (ref 150.0–400.0)

## 2011-02-08 LAB — LIPID PANEL
HDL: 56.8 mg/dL (ref >39.00)
LDL Cholesterol: 68 mg/dL (ref 0–99)
Total CHOL/HDL Ratio: 2
VLDL: 15 mg/dL (ref 0.0–40.0)

## 2011-02-08 MED ORDER — ROSUVASTATIN CALCIUM 20 MG PO TABS: 20.0000 mg | ORAL_TABLET | Freq: Every day | ORAL | Status: DC

## 2011-02-08 MED ORDER — AMOXICILLIN-POT CLAVULANATE 875-125 MG PO TABS: 1.0000 | ORAL_TABLET | Freq: Two times a day (BID) | ORAL | Status: AC

## 2011-02-08 MED ORDER — LOSARTAN POTASSIUM 50 MG PO TABS: 50.0000 mg | ORAL_TABLET | Freq: Every day | ORAL | Status: DC

## 2011-02-08 MED ORDER — GLIPIZIDE ER 10 MG PO TB24: 10.0000 mg | ORAL_TABLET | Freq: Every day | ORAL | Status: DC

## 2011-02-08 NOTE — Patient Instructions (Signed)
Take the augmentin for your sinus infection Drink lots of fluids -nasal saline does help  Labs today Schedule PE at check out

## 2011-02-08 NOTE — Progress Notes (Signed)
Subjective:    Patient ID: Donna Murphy, female    DOB: July 27, 1945, 65 y.o.   MRN: [id number]  HPI Here for cough and sinus symptoms and also med refils for chronic med problems  Thinks she may have a sinus infection  Started with a cold a month ago -- got better and worse Lots of drainage and headache up and down, with return of st Just does not feel good  Nl temp is 97-- higher than avg Some chills  Facial pain  Ears do not hurt- but initially they did  Cough lingers - not severe or prod   HTN bp is 140/84-- drank caff coffee accidentally-- usually lower  No cp or palpitations or edema  No med side eff   DM Lab Results  Component Value Date   HGBA1C 7.4* 02/13/2008   on glucotrol and janumet Sugar tends to be high in am -- for 1 mo 150 or above  Sees endo and has appt upcoming -- was doing well for a while - with spike in aug and now   Lipids- crestor for cholesterol- is tolerating that well  Lab Results  Component Value Date   CHOL 132 12/24/2008   CHOL 151 02/13/2008   CHOL 151 02/12/2008   Lab Results  Component Value Date   HDL 45.80 12/24/2008   HDL 40.9 02/13/2008   HDL 81.1 02/12/2008   Lab Results  Component Value Date   LDLCALC 68 12/24/2008   LDLCALC 90 02/13/2008   LDLCALC 90 02/12/2008   Lab Results  Component Value Date   TRIG 90.0 12/24/2008   TRIG 86 02/13/2008   TRIG 86 02/12/2008   Lab Results  Component Value Date   CHOLHDL 3 12/24/2008   CHOLHDL 3.5 CALC 02/13/2008   CHOLHDL 3.5 CALC 02/12/2008   No results found for this basename: LDLDIRECT   overall control is at goal Diet is fair - better than it used to be   Patient Active Problem List  Diagnoses  . DM, UNCOMPLICATED, TYPE II  . HYPERLIPIDEMIA  . HYPERTENSION  . VARICOSE VEIN  . SINUSITIS - ACUTE-NOS  . ALLERGIC RHINITIS  . GERD  . IRRITABLE BOWEL SYNDROME   Past Medical History  Diagnosis Date  . Allergic rhinitis   . GERD (gastroesophageal reflux disease)   . HLD  (hyperlipidemia)   . HTN (hypertension)   . DMII (diabetes mellitus, type 2)   . Irritable bowel syndrome   . Varices of other sites   . Fibroids     uterine   Past Surgical History  Procedure Date  . Tonsillectomy   . Endometrial biopsy 10/01    negative  . Colonoscopy 9/07    negative   History  Substance Use Topics  . Smoking status: Never Smoker   . Smokeless tobacco: Not on file  . Alcohol Use: No   Family History  Problem Relation Age of Onset  . Coronary artery disease Mother   . Hyperlipidemia Mother   . Hypertension Mother   . Osteoporosis Mother   . Heart attack Mother     CABG in her 4's  . Diabetes Father   . Heart failure Father   . Hypertension Father   . Transient ischemic attack Father   . Diabetes      GF  . Hypertension Brother   . Heart murmur Son    Allergies  Allergen Reactions  . Ciprofloxacin   . Sulfonamide Derivatives    Current Outpatient Prescriptions  on File Prior to Visit  Medication Sig Dispense Refill  . aspirin 81 MG tablet Take 81 mg by mouth daily.        . cromolyn (NASALCROM) 5.2 MG/ACT nasal spray Place 2 sprays into the nose daily as needed.        [name] glucose blood (ONE TOUCH ULTRA TEST) test strip Use as instructed  100 each  1  . loratadine (CLARITIN) 10 MG tablet Take 10 mg by mouth daily as needed.        . Multiple Vitamin (MULTIVITAMIN) tablet Take 1 tablet by mouth daily.        . Omega-3 Fatty Acids (FISH OIL) 1200 MG CAPS Take 2 capsules by mouth daily.        [name] omeprazole (PRILOSEC) 20 MG capsule Take 20 mg by mouth daily.        . sitaGLIPtan-metformin (JANUMET) 50-1000 MG per tablet Take 1 tablet by mouth 2 (two) times daily with a meal.        . fluticasone (FLONASE) 50 MCG/ACT nasal spray Place 2 sprays into the nose daily.            Review of Systems Review of Systems  Constitutional: Negative for fever, appetite change, fatigue and unexpected weight change.  Eyes: Negative for pain and visual  disturbance.  ENT pos for congestion/ sinus pain/ no epistaxis  Respiratory: Negative for cough and shortness of breath.   Cardiovascular: Negative for cp or palpitations    Gastrointestinal: Negative for nausea, diarrhea and constipation.  Genitourinary: Negative for urgency and frequency.  Skin: Negative for pallor or rash   Neurological: Negative for weakness, light-headedness, numbness and headaches.  Hematological: Negative for adenopathy. Does not bruise/bleed easily.  Psychiatric/Behavioral: Negative for dysphoric mood. The patient is not nervous/anxious.          Objective:   Physical Exam  Constitutional: She is oriented to person, place, and time. She appears well-developed and well-nourished. No distress.       Mildly overwt and well app  HENT:  Head: Normocephalic and atraumatic.  Right Ear: External ear normal.  Left Ear: External ear normal.  Mouth/Throat: Oropharynx is clear and moist.       Diffuse sinus tenderness Nares conj and injected  Post nasal drip  Eyes: Conjunctivae and EOM are normal. Pupils are equal, round, and reactive to light. No scleral icterus.  Neck: Normal range of motion. Neck supple. No JVD present. Carotid bruit is not present. No thyromegaly present.  Cardiovascular: Normal rate, regular rhythm, normal heart sounds and intact distal pulses.  Exam reveals no gallop.   Pulmonary/Chest: Effort normal and breath sounds normal. No respiratory distress. She has no wheezes.  Abdominal: Soft. Bowel sounds are normal. She exhibits no distension, no abdominal bruit and no mass. There is no tenderness.  Musculoskeletal: Normal range of motion. She exhibits no edema and no tenderness.  Lymphadenopathy:    She has no cervical adenopathy.  Neurological: She is alert and oriented to person, place, and time. She has normal reflexes. No cranial nerve deficit. She exhibits normal muscle tone. Coordination normal.  Skin: Skin is warm and dry. No rash noted. No  erythema. No pallor.  Psychiatric: She has a normal mood and affect.          Assessment & Plan:

## 2011-02-09 NOTE — Assessment & Plan Note (Signed)
Continues to see endo Not perfect control  Multi drug regimen ? If compliant with diet  Enc better habits F/u endo as planned

## 2011-02-09 NOTE — Assessment & Plan Note (Signed)
Good control with crestor and diet  Disc goals for lipids and reasons to control them Rev labs with pt Rev low sat fat diet in detail   

## 2011-02-09 NOTE — Assessment & Plan Note (Signed)
Control is adequate bp in fair control at this time  No changes needed  Disc lifstyle change with low sodium diet and exercise   Lab today

## 2011-02-14 ENCOUNTER — Other Ambulatory Visit: Payer: Self-pay | Admitting: Family Medicine

## 2011-02-16 ENCOUNTER — Ambulatory Visit (INDEPENDENT_AMBULATORY_CARE_PROVIDER_SITE_OTHER): Payer: PRIVATE HEALTH INSURANCE

## 2011-02-16 DIAGNOSIS — Z23 Encounter for immunization: Secondary | ICD-10-CM

## 2011-03-01 ENCOUNTER — Encounter: Payer: Self-pay | Admitting: Family Medicine

## 2011-03-01 ENCOUNTER — Ambulatory Visit (INDEPENDENT_AMBULATORY_CARE_PROVIDER_SITE_OTHER): Payer: PRIVATE HEALTH INSURANCE | Admitting: Family Medicine

## 2011-03-01 VITALS — BP 120/70 | HR 72 | Temp 98.0°F | Wt 150.0 lb

## 2011-03-01 DIAGNOSIS — M545 Low back pain, unspecified: Secondary | ICD-10-CM

## 2011-03-01 DIAGNOSIS — R300 Dysuria: Secondary | ICD-10-CM

## 2011-03-01 DIAGNOSIS — J069 Acute upper respiratory infection, unspecified: Secondary | ICD-10-CM | POA: Insufficient documentation

## 2011-03-01 LAB — POCT URINALYSIS DIPSTICK
Ketones, UA: NEGATIVE
Leukocytes, UA: NEGATIVE
Nitrite, UA: NEGATIVE
Protein, UA: NEGATIVE
Urobilinogen, UA: 0.2
pH, UA: 6

## 2011-03-01 MED ORDER — PHENAZOPYRIDINE HCL 200 MG PO TABS: 200.0000 mg | ORAL_TABLET | Freq: Three times a day (TID) | ORAL | Status: DC | PRN

## 2011-03-01 MED ORDER — CEPHALEXIN 500 MG PO CAPS: 500.0000 mg | ORAL_CAPSULE | Freq: Four times a day (QID) | ORAL | Status: AC

## 2011-03-01 NOTE — Assessment & Plan Note (Signed)
Will culture urine as micro was benign. Given Keflex to use if needed.

## 2011-03-01 NOTE — Assessment & Plan Note (Signed)
See instructions

## 2011-03-01 NOTE — Progress Notes (Signed)
  Subjective:    Patient ID: Donna Murphy, female    DOB: [birth year], 65 y.o.   MRN: [id number]  HPI Pt of Dr Royden Purl here as acute appt for low back pain and aching which she thinks may be a urinary  infection and congestion which she thinks may be a sinus infection. She was treated for a sinus infection in early November by Dr Milinda Antis when seen.  "I am on my third go around with the sinus stuff." She had this the beginning of August, then again two weeks later when she saw Dr Milinda Antis and then now. She was in [place] for Thanksgiving.  She has her "normal sxs for bladder infection with LBP and achiness. She is diabetic and her BS has elevated with all these episodes.  She denies fever or chills, some headache mid frontal area. She is taking IBP. Discussed this. No ear paiin today but some last night. Some rhinitis, no ST (had previously), some cough, esp with talking. No N/V, no diarrhea. She has taken IBP.     Review of SystemsNoncontributory except as above.       Objective:   Physical Exam  Constitutional: She appears well-developed and well-nourished. No distress.  HENT:  Head: Normocephalic and atraumatic.  Right Ear: External ear normal.  Left Ear: External ear normal.  Nose: Nose normal.  Mouth/Throat: Oropharynx is clear and moist. No oropharyngeal exudate.  Eyes: Conjunctivae and EOM are normal. Pupils are equal, round, and reactive to light.  Neck: Normal range of motion. Neck supple. No thyromegaly present.  Cardiovascular: Normal rate, regular rhythm and normal heart sounds.   Pulmonary/Chest: Effort normal and breath sounds normal. She has no wheezes. She has no rales.  Abdominal:       Mild suprapubic discomfort with palpation.  Musculoskeletal:       No CVAT.  Lymphadenopathy:    She has no cervical adenopathy.  Skin: She is not diaphoretic.          Assessment & Plan:

## 2011-03-01 NOTE — Patient Instructions (Signed)
Take Guaifenesin (400mg ), take 11/2 tabs by mouth AM and NOON. Get GUAIFENESIN by  [facility] or [facility] and getting MUCOUS RELIEF EXPECTORANT/CONGESTION. DO NOT GET MUCINEX (Timed Release Guaifenesin) Drink lots of fluids. Take Tyl 500mg  2 three times a day for a few days. Stop IBP. Keep lozenge in mouth for 3-4 days. Gargle with 30ccs of warm salt water every half hour for 2 days as able. Take Pyridium for 3-4 days. If sxs worsen, cont above and add Keflex.

## 2011-03-01 NOTE — Progress Notes (Signed)
Addended by: Sydell Axon C on: 03/01/2011 03:24 PM   Modules accepted: Orders

## 2011-03-08 ENCOUNTER — Ambulatory Visit: Payer: PRIVATE HEALTH INSURANCE | Admitting: Family Medicine

## 2011-04-25 ENCOUNTER — Other Ambulatory Visit: Payer: Self-pay | Admitting: *Deleted

## 2011-04-25 MED ORDER — GLUCOSE BLOOD VI STRP: ORAL_STRIP | Status: DC

## 2011-05-24 ENCOUNTER — Ambulatory Visit (INDEPENDENT_AMBULATORY_CARE_PROVIDER_SITE_OTHER): Payer: PRIVATE HEALTH INSURANCE | Admitting: Family Medicine

## 2011-05-24 ENCOUNTER — Encounter: Payer: Self-pay | Admitting: Family Medicine

## 2011-05-24 ENCOUNTER — Other Ambulatory Visit (HOSPITAL_COMMUNITY)
Admission: RE | Admit: 2011-05-24 | Discharge: 2011-05-24 | Disposition: A | Discharge status: Home / Self Care | Payer: PRIVATE HEALTH INSURANCE | Source: Ambulatory Visit | Attending: Family Medicine | Admitting: Family Medicine

## 2011-05-24 VITALS — BP 120/68 | HR 88 | Temp 97.9°F | Ht 64.0 in | Wt 150.0 lb

## 2011-05-24 DIAGNOSIS — Z1231 Encounter for screening mammogram for malignant neoplasm of breast: Secondary | ICD-10-CM | POA: Insufficient documentation

## 2011-05-24 DIAGNOSIS — Z01419 Encounter for gynecological examination (general) (routine) without abnormal findings: Secondary | ICD-10-CM | POA: Insufficient documentation

## 2011-05-24 DIAGNOSIS — Z1159 Encounter for screening for other viral diseases: Secondary | ICD-10-CM | POA: Insufficient documentation

## 2011-05-24 DIAGNOSIS — I10 Essential (primary) hypertension: Secondary | ICD-10-CM

## 2011-05-24 DIAGNOSIS — Z0000 Encounter for general adult medical examination without abnormal findings: Secondary | ICD-10-CM

## 2011-05-24 DIAGNOSIS — E785 Hyperlipidemia, unspecified: Secondary | ICD-10-CM

## 2011-05-24 NOTE — Progress Notes (Signed)
Subjective:    Patient ID: Donna Murphy, female    DOB: 16-Oct-1945, 66 y.o.   MRN: [id number]  HPI Here for health maintenance exam and to review chronic medical problems   Is doing well overall  Getting over a head cold - is better - with clear mucous    Pap 11/09 Is time to do that  Gyn- no problems   Zoster status--not had shingles or vaccine  Her insurance does not cover - will check on that   mammo 9/11- needs one  Self exam no lumps or changes   colonosc 9/07 normal -- thought 10 year f/u  , but then got reminder for 5 year  Had polyp long ago   DM- sees endocrine Went in dec and goes back in April , sugar went up due to illness at that time  Thinks her sugar is better  , last a1c was 7 Last week at home sugars went higher (sick) again (stress is also a trigger)  Does exercise   bp is120/68     Today No cp or palpitations or headaches or edema  No side effects to medicines    Lipids good in fall on diet and crestor Lab Results  Component Value Date   CHOL 140 02/08/2011   CHOL 132 12/24/2008   CHOL 151 02/13/2008   Lab Results  Component Value Date   HDL 56.80 02/08/2011   HDL 95.28 12/24/2008   HDL 41.3 02/13/2008   Lab Results  Component Value Date   LDLCALC 68 02/08/2011   LDLCALC 68 12/24/2008   LDLCALC 90 02/13/2008   Lab Results  Component Value Date   TRIG 75.0 02/08/2011   TRIG 90.0 12/24/2008   TRIG 86 02/13/2008   Lab Results  Component Value Date   CHOLHDL 2 02/08/2011   CHOLHDL 3 12/24/2008   CHOLHDL 3.5 CALC 02/13/2008   No results found for this basename: LDLDIRECT    Eats a very health diet for the most part Is trying to loose weight - not having no success  Needs to step up the exercise  Patient Active Problem List  Diagnoses  . DM, UNCOMPLICATED, TYPE II  . HYPERLIPIDEMIA  . HYPERTENSION  . VARICOSE VEIN  . ALLERGIC RHINITIS  . GERD  . IRRITABLE BOWEL SYNDROME  . Dysuria  . Routine general medical examination at a health  care facility  . Other screening mammogram  . Gynecological examination   Past Medical History  Diagnosis Date  . Allergic rhinitis   . GERD (gastroesophageal reflux disease)   . HLD (hyperlipidemia)   . HTN (hypertension)   . DMII (diabetes mellitus, type 2)   . Irritable bowel syndrome   . Varices of other sites   . Fibroids     uterine   Past Surgical History  Procedure Date  . Tonsillectomy   . Endometrial biopsy 10/01    negative  . Colonoscopy 9/07    negative   History  Substance Use Topics  . Smoking status: Never Smoker   . Smokeless tobacco: Never Used  . Alcohol Use: Yes     wine every other day   Family History  Problem Relation Age of Onset  . Coronary artery disease Mother   . Hyperlipidemia Mother   . Hypertension Mother   . Osteoporosis Mother   . Heart attack Mother     CABG in her 47's  . Diabetes Father   . Heart failure Father   . Hypertension  Father   . Transient ischemic attack Father   . Diabetes      GF  . Hypertension Brother   . Heart murmur Son    Allergies  Allergen Reactions  . Ciprofloxacin   . Sulfonamide Derivatives    Current Outpatient Prescriptions on File Prior to Visit  Medication Sig Dispense Refill  . aspirin 81 MG tablet Take 81 mg by mouth daily.        . cromolyn (NASALCROM) 5.2 MG/ACT nasal spray Place 2 sprays into the nose daily as needed.        [name] glipiZIDE (GLUCOTROL XL) 10 MG 24 hr tablet Take 1 tablet (10 mg total) by mouth daily.  30 tablet  11  . glucose blood (ONE TOUCH ULTRA TEST) test strip Use as instructed  100 each  11  . loratadine (CLARITIN) 10 MG tablet Take 10 mg by mouth daily as needed.        [name] losartan (COZAAR) 50 MG tablet Take 1 tablet (50 mg total) by mouth daily.  30 tablet  11  . Multiple Vitamin (MULTIVITAMIN) tablet Take 1 tablet by mouth daily.        . Omega-3 Fatty Acids (FISH OIL) 1200 MG CAPS Take 2 capsules by mouth daily.        [name] omeprazole (PRILOSEC) 20 MG capsule Take 20 mg  by mouth daily.        . rosuvastatin (CRESTOR) 20 MG tablet Take 1 tablet (20 mg total) by mouth daily.  30 tablet  11  . sitaGLIPtan-metformin (JANUMET) 50-1000 MG per tablet Take 1 tablet by mouth 2 (two) times daily with a meal.            Review of Systems Review of Systems  Constitutional: Negative for fever, appetite change, fatigue and unexpected weight change.  Eyes: Negative for pain and visual disturbance.  Respiratory: Negative for cough and shortness of breath.   Cardiovascular: Negative for cp or palpitations    Gastrointestinal: Negative for nausea, diarrhea and constipation.  Genitourinary: Negative for urgency and frequency.  Skin: Negative for pallor or rash   Neurological: Negative for weakness, light-headedness, numbness and headaches.  Hematological: Negative for adenopathy. Does not bruise/bleed easily.  Psychiatric/Behavioral: Negative for dysphoric mood. The patient is not nervous/anxious.          Objective:   Physical Exam  Constitutional: She appears well-developed and well-nourished. No distress.  HENT:  Head: Normocephalic and atraumatic.  Right Ear: External ear normal.  Left Ear: External ear normal.  Nose: Nose normal.  Mouth/Throat: Oropharynx is clear and moist.       Nares are boggy  Eyes: Conjunctivae and EOM are normal. Pupils are equal, round, and reactive to light. No scleral icterus.  Neck: Normal range of motion. Neck supple. No JVD present. Carotid bruit is not present. No thyromegaly present.  Cardiovascular: Normal rate, regular rhythm, normal heart sounds and intact distal pulses.  Exam reveals no gallop.   Pulmonary/Chest: Effort normal and breath sounds normal. No respiratory distress. She has no wheezes.  Abdominal: Soft. Bowel sounds are normal. She exhibits no distension, no abdominal bruit and no mass. There is no tenderness.  Genitourinary: Rectum normal, vagina normal and uterus normal. No breast swelling, tenderness or  discharge. There is no rash on the right labia. Uterus is not enlarged and not tender. Cervix exhibits no motion tenderness, no discharge and no friability. Right adnexum displays no mass, no tenderness and no fullness. Left adnexum  displays no mass, no tenderness and no fullness. No erythema around the vagina. No vaginal discharge found.  Musculoskeletal: Normal range of motion. She exhibits no edema and no tenderness.  Lymphadenopathy:    She has no cervical adenopathy.  Neurological: She is alert. She has normal reflexes. No cranial nerve deficit. She exhibits normal muscle tone. Coordination normal.  Skin: Skin is warm and dry. No rash noted. No erythema. No pallor.  Psychiatric: She has a normal mood and affect.          Assessment & Plan:

## 2011-05-24 NOTE — Assessment & Plan Note (Signed)
Reviewed health habits including diet and exercise and skin cancer prevention Also reviewed health mt list, fam hx and immunizations   Rev wellness labs from fall  Will check into coverage of zoster vaccine

## 2011-05-24 NOTE — Assessment & Plan Note (Signed)
Good control with crestor and diet  Disc goals for lipids and reasons to control them Rev labs with pt Rev low sat fat diet in detail   

## 2011-05-24 NOTE — Assessment & Plan Note (Signed)
bp in fair control at this time  No changes needed  Disc lifstyle change with low sodium diet and exercise   

## 2011-05-24 NOTE — Patient Instructions (Addendum)
Please call [facility] to see when colonoscopy is due We will schedule mammogram  at check out  If you are interested in a shingles/zoster vaccine - call your insurance to check on coverage,( you should not get it within 1 month of other vaccines) , then call us for a prescription  for it to take to a pharmacy that gives the shot  Keep working on healthy diet and exercise

## 2011-05-24 NOTE — Assessment & Plan Note (Signed)
Routine exam with pap No complaints  

## 2011-07-17 ENCOUNTER — Ambulatory Visit (INDEPENDENT_AMBULATORY_CARE_PROVIDER_SITE_OTHER): Payer: PRIVATE HEALTH INSURANCE | Admitting: Family Medicine

## 2011-07-17 ENCOUNTER — Encounter: Payer: Self-pay | Admitting: Family Medicine

## 2011-07-17 VITALS — BP 138/72 | HR 96 | Temp 97.8°F | Ht 64.0 in | Wt 150.2 lb

## 2011-07-17 DIAGNOSIS — B9689 Other specified bacterial agents as the cause of diseases classified elsewhere: Secondary | ICD-10-CM | POA: Insufficient documentation

## 2011-07-17 DIAGNOSIS — J019 Acute sinusitis, unspecified: Secondary | ICD-10-CM

## 2011-07-17 MED ORDER — AMOXICILLIN-POT CLAVULANATE 875-125 MG PO TABS: 1.0000 | ORAL_TABLET | Freq: Two times a day (BID) | ORAL | Status: AC

## 2011-07-17 NOTE — Progress Notes (Signed)
Subjective:    Patient ID: Donna Murphy, female    DOB: 1945-10-31, 66 y.o.   MRN: [id number]  HPI Here for symptoms of sinus infection Had a cold for 2 weeks- starting with cong and aching  Some colored nasal drainage with blood -- for past week  Sunday am - very bad headache on R side- even teeth and ear hurts Low grade fever and chills  Tried netti pot- to congested to get the water through   Mother fell -- stress/ in 90s - in hospice - trying to help care for her   Older sister dx with brain tumor - stress   Patient Active Problem List  Diagnoses  . DM, UNCOMPLICATED, TYPE II  . HYPERLIPIDEMIA  . HYPERTENSION  . VARICOSE VEIN  . ALLERGIC RHINITIS  . GERD  . IRRITABLE BOWEL SYNDROME  . Dysuria  . Routine general medical examination at a health care facility  . Other screening mammogram  . Gynecological examination  . Acute bacterial sinusitis   Past Medical History  Diagnosis Date  . Allergic rhinitis   . GERD (gastroesophageal reflux disease)   . HLD (hyperlipidemia)   . HTN (hypertension)   . DMII (diabetes mellitus, type 2)   . Irritable bowel syndrome   . Varices of other sites   . Fibroids     uterine   Past Surgical History  Procedure Date  . Tonsillectomy   . Endometrial biopsy 10/01    negative  . Colonoscopy 9/07    negative   History  Substance Use Topics  . Smoking status: Never Smoker   . Smokeless tobacco: Never Used  . Alcohol Use: Yes     wine every other day   Family History  Problem Relation Age of Onset  . Coronary artery disease Mother   . Hyperlipidemia Mother   . Hypertension Mother   . Osteoporosis Mother   . Heart attack Mother     CABG in her 43's  . Diabetes Father   . Heart failure Father   . Hypertension Father   . Transient ischemic attack Father   . Diabetes      GF  . Hypertension Brother   . Heart murmur Son   . Brain cancer Sister    Allergies  Allergen Reactions  . Ciprofloxacin   . Sulfonamide  Derivatives    Current Outpatient Prescriptions on File Prior to Visit  Medication Sig Dispense Refill  . aspirin 81 MG tablet Take 81 mg by mouth daily.        . cromolyn (NASALCROM) 5.2 MG/ACT nasal spray Place 2 sprays into the nose daily as needed.        [name] glipiZIDE (GLUCOTROL XL) 10 MG 24 hr tablet Take 1 tablet (10 mg total) by mouth daily.  30 tablet  11  . glucose blood (ONE TOUCH ULTRA TEST) test strip Use as instructed  100 each  11  . loratadine (CLARITIN) 10 MG tablet Take 10 mg by mouth daily as needed.        [name] losartan (COZAAR) 50 MG tablet Take 1 tablet (50 mg total) by mouth daily.  30 tablet  11  . Multiple Vitamin (MULTIVITAMIN) tablet Take 1 tablet by mouth daily.        . Omega-3 Fatty Acids (FISH OIL) 1200 MG CAPS Take 2 capsules by mouth daily.        [name] omeprazole (PRILOSEC) 20 MG capsule Take 20 mg by mouth daily.        [name]  rosuvastatin (CRESTOR) 20 MG tablet Take 1 tablet (20 mg total) by mouth daily.  30 tablet  11  . sitaGLIPtan-metformin (JANUMET) 50-1000 MG per tablet Take 1 tablet by mouth 2 (two) times daily with a meal.            Review of Systems Review of Systems  Constitutional: Negative for  appetite change,  and unexpected weight change.  Eyes: Negative for pain and visual disturbance.  ENT pos for cong/ purulent drainage and facial pain  Respiratory: pos for mild cough without wheeze Cardiovascular: Negative for cp or palpitations    Gastrointestinal: Negative for nausea, diarrhea and constipation.  Genitourinary: Negative for urgency and frequency.  Skin: Negative for pallor or rash   Neurological: Negative for weakness, light-headedness, numbness and headaches.  Hematological: Negative for adenopathy. Does not bruise/bleed easily.  Psychiatric/Behavioral: Negative for dysphoric mood. The patient is not nervous/anxious.          Objective:   Physical Exam  Constitutional: She appears well-developed and well-nourished. No distress.  HENT:    Head: Normocephalic and atraumatic.  Right Ear: External ear normal.  Left Ear: External ear normal.  Mouth/Throat: Oropharynx is clear and moist. No oropharyngeal exudate.       Nares are injected and congested   bilat maxillary sinus tenderness Post nasal drainage  Eyes: Conjunctivae are normal. Pupils are equal, round, and reactive to light. Right eye exhibits no discharge. Left eye exhibits no discharge.  Neck: Normal range of motion. Neck supple. No thyromegaly present.  Cardiovascular: Normal rate and regular rhythm.   Pulmonary/Chest: Effort normal and breath sounds normal. No respiratory distress. She has no wheezes. She has no rales.  Lymphadenopathy:    She has no cervical adenopathy.  Skin: Skin is warm and dry. No rash noted.  Psychiatric: She has a normal mood and affect.          Assessment & Plan:

## 2011-07-17 NOTE — Patient Instructions (Signed)
Continue netti pot if it works , breathe steam  Use mucinex and also ibuprofen  Take the augmentin as directed Update if not starting to improve in a week or if worsening

## 2011-07-17 NOTE — Assessment & Plan Note (Signed)
2 wk after viral uri with sinus pain/ purulent drainage/ low grade fever tx with augmentin Disc symptomatic care - see instructions on AVS  Update if not starting to improve in a week or if worsening

## 2011-08-08 ENCOUNTER — Ambulatory Visit: Payer: Self-pay | Admitting: Family Medicine

## 2011-08-09 ENCOUNTER — Encounter: Payer: Self-pay | Admitting: Family Medicine

## 2011-08-10 ENCOUNTER — Encounter: Payer: Self-pay | Admitting: *Deleted

## 2011-11-16 ENCOUNTER — Other Ambulatory Visit: Payer: Self-pay

## 2011-11-16 NOTE — Telephone Encounter (Signed)
Pt starting Medicare 12/03/11 and cannot use current pharmacy; pt has option of 2 different pharmacies and request written rx for Glipizide and losartan. Pt request generic or substitute for Crestor and Janumet; too expensive with Medicare coverage. Pt said if no generic or sub for Janumet let her know.Please advise.

## 2011-11-17 MED ORDER — LOSARTAN POTASSIUM 50 MG PO TABS: 50.0000 mg | ORAL_TABLET | Freq: Every day | ORAL | Status: DC

## 2011-11-17 MED ORDER — GLIPIZIDE ER 10 MG PO TB24: 10.0000 mg | ORAL_TABLET | Freq: Every day | ORAL | Status: DC

## 2011-11-17 NOTE — Telephone Encounter (Signed)
Left message for patient to call about generic crestor.

## 2011-11-17 NOTE — Telephone Encounter (Signed)
Px printed for pick up in IN box  No subs for janumet We can do generic lipitor instead of crestor ... Is she ok with that ?  (it is not quite as powerful so will need higher dose)

## 2011-11-24 ENCOUNTER — Telehealth: Payer: Self-pay | Admitting: Family Medicine

## 2011-11-24 ENCOUNTER — Telehealth: Payer: Self-pay

## 2011-11-24 MED ORDER — ATORVASTATIN CALCIUM 40 MG PO TABS: 40.0000 mg | ORAL_TABLET | Freq: Every day | ORAL | Status: DC

## 2011-11-24 NOTE — Telephone Encounter (Signed)
Rx called into pharmacy as instructed 

## 2011-11-24 NOTE — Telephone Encounter (Signed)
Patient stated that she needs a paper Rx for all of her medication because she is switching to a new insurance 12/03/11 and she needs to switch her pharmacy as well.

## 2011-11-24 NOTE — Telephone Encounter (Signed)
Caller: Donna Murphy/Patient; Phone: [phone]; Reason for Call: Pt advises she would like to try generic Lipitor as previously discussed; please call her to advise when this Rx can be picked up next week.  She also needs Rx for one-touch Ultra test strips.

## 2011-11-24 NOTE — Telephone Encounter (Signed)
Stop crestor  Start generic lipitor - if side eff let me know  Px written for call in   Schedule fasting labs here 6 weeks lipid/ast/alt 272 to see how it is working  Her test strips should come from her endocrinologist who takes care of her DM - as insurance is asking for records more often lately

## 2011-11-27 NOTE — Telephone Encounter (Signed)
Please ask if for 90 or 30 days Ask her to list the meds she needs from me Tell me which pharmacy... I may or may not be able to electronically send it --if not will print them

## 2011-11-27 NOTE — Telephone Encounter (Signed)
Left message with patient spouse to contact us regrding her medication. Please take the information below when patient calls back.

## 2011-11-28 MED ORDER — CROMOLYN SODIUM 5.2 MG/ACT NA AERS: 2.0000 | INHALATION_SPRAY | Freq: Every day | NASAL | Status: DC | PRN

## 2011-11-28 MED ORDER — ATORVASTATIN CALCIUM 40 MG PO TABS: 40.0000 mg | ORAL_TABLET | Freq: Every day | ORAL | Status: DC

## 2011-11-28 MED ORDER — LOSARTAN POTASSIUM 50 MG PO TABS: 50.0000 mg | ORAL_TABLET | Freq: Every day | ORAL | Status: DC

## 2011-11-28 MED ORDER — OMEPRAZOLE 20 MG PO CPDR: 20.0000 mg | DELAYED_RELEASE_CAPSULE | Freq: Every day | ORAL | Status: DC

## 2011-11-28 NOTE — Telephone Encounter (Signed)
Px printed for pick up in IN box Will not do her DM px since she sees endocrinologist for that

## 2011-11-28 NOTE — Telephone Encounter (Signed)
Due to pt starting Medicare pt cannot go to [facility]. Pt has not decided which local pharmacy she is going to use that is why she request written rx. Pt wants 30 day supply.Call when ready for pick up.

## 2011-11-28 NOTE — Addendum Note (Signed)
Addended by: Roxy Manns A on: 11/28/2011 12:05 PM   Modules accepted: Orders

## 2011-11-28 NOTE — Telephone Encounter (Signed)
Left message on patient vm letting her know that Rx was ready for pickup.

## 2011-11-28 NOTE — Addendum Note (Signed)
Addended by: Patience Musca on: 11/28/2011 11:42 AM   Modules accepted: Orders

## 2011-12-27 ENCOUNTER — Encounter: Payer: Self-pay | Admitting: Internal Medicine

## 2012-01-11 ENCOUNTER — Encounter: Payer: Self-pay | Admitting: Family Medicine

## 2012-01-11 ENCOUNTER — Ambulatory Visit (INDEPENDENT_AMBULATORY_CARE_PROVIDER_SITE_OTHER): Payer: Medicare Other | Admitting: Family Medicine

## 2012-01-11 VITALS — BP 144/84 | HR 93 | Temp 98.3°F | Wt 149.0 lb

## 2012-01-11 DIAGNOSIS — J019 Acute sinusitis, unspecified: Secondary | ICD-10-CM | POA: Diagnosis not present

## 2012-01-11 MED ORDER — AMOXICILLIN-POT CLAVULANATE 875-125 MG PO TABS: 1.0000 | ORAL_TABLET | Freq: Two times a day (BID) | ORAL | Status: DC

## 2012-01-11 NOTE — Assessment & Plan Note (Signed)
Nontoxic, would start augmentin given her history and duration.  She agrees.  See instructions.

## 2012-01-11 NOTE — Patient Instructions (Signed)
Drink plenty of fluids, take tylenol as needed, and keep using the neti pot.  Start the antibiotics today.  This should gradually improve.  Take care.  Let us know if you have other concerns.

## 2012-01-11 NOTE — Progress Notes (Signed)
H/o DM2.  Recently with cold symptoms. 3 weeks of symptoms- aches, fatigue, sinus pain.  Using neti poti.  Now with inc in sugar, 200 this AM, 244 yesterday, before eating.  Usually ~140 in AM.  Felt hot recently.  Some cough, scant intermittent sputum.  Grandkids have been sick. Voice is altered.    Meds, vitals, and allergies reviewed.   ROS: See HPI.  Otherwise, noncontributory.  GEN: nad, alert and oriented HEENT: mucous membranes moist, tm w/o erythema, nasal exam w/o erythema, clear discharge noted,  OP with cobblestoning, frontal and max sinuses mildly ttp NECK: supple w/o LA CV: rrr.   PULM: ctab, no inc wob EXT: no edema SKIN: no acute rash

## 2012-01-23 DIAGNOSIS — E7800 Pure hypercholesterolemia, unspecified: Secondary | ICD-10-CM | POA: Diagnosis not present

## 2012-01-23 DIAGNOSIS — Z23 Encounter for immunization: Secondary | ICD-10-CM | POA: Diagnosis not present

## 2012-01-23 DIAGNOSIS — K219 Gastro-esophageal reflux disease without esophagitis: Secondary | ICD-10-CM | POA: Diagnosis not present

## 2012-01-23 DIAGNOSIS — I10 Essential (primary) hypertension: Secondary | ICD-10-CM | POA: Diagnosis not present

## 2012-05-13 ENCOUNTER — Other Ambulatory Visit: Payer: Self-pay | Admitting: *Deleted

## 2012-05-13 MED ORDER — GLUCOSE BLOOD VI STRP: ORAL_STRIP | Status: DC

## 2012-06-03 ENCOUNTER — Encounter: Payer: Self-pay | Admitting: Family Medicine

## 2012-06-03 ENCOUNTER — Ambulatory Visit (INDEPENDENT_AMBULATORY_CARE_PROVIDER_SITE_OTHER): Payer: Medicare Other | Admitting: Family Medicine

## 2012-06-03 VITALS — BP 132/84 | HR 90 | Temp 98.7°F | Ht 64.0 in | Wt 151.2 lb

## 2012-06-03 DIAGNOSIS — R21 Rash and other nonspecific skin eruption: Secondary | ICD-10-CM | POA: Insufficient documentation

## 2012-06-03 MED ORDER — TRIAMCINOLONE ACETONIDE 0.025 % EX CREA: TOPICAL_CREAM | Freq: Every day | CUTANEOUS | Status: DC | PRN

## 2012-06-03 NOTE — Assessment & Plan Note (Signed)
On legs only- I suspect this is a contact dermatitis with some features of eczema  Adv to update if new symptoms Will avoid hot water and detergents-see AVS claritin for itch Triamcinolone cream -sparingly  Update if not starting to improve in a week or if worsening

## 2012-06-03 NOTE — Progress Notes (Signed)
Subjective:    Patient ID: Donna Murphy, female    DOB: 02/25/1946, 67 y.o.   MRN: [id number]  HPI Here for a rash on her legs For about 1 week- and not going away ? Hives   Was taking a new medicine to help wt loss- slimquick  (raspberry ketone) - stopped that a week ago  Also had red eyes and took "pink eye releif"- herbal similasan (belladonna, wuphrasia, hepar sulphuris)--has used that before and no issues  Is allergic to sulfa   No rash anywhere else besides legs  No flea exposure   Skin feels rough- a little itchy  Patient Active Problem List  Diagnosis  . DM, UNCOMPLICATED, TYPE II  . HYPERLIPIDEMIA  . HYPERTENSION  . VARICOSE VEIN  . ALLERGIC RHINITIS  . GERD  . IRRITABLE BOWEL SYNDROME  . Routine general medical examination at a health care facility  . Other screening mammogram  . Gynecological examination   Past Medical History  Diagnosis Date  . Allergic rhinitis   . GERD (gastroesophageal reflux disease)   . HLD (hyperlipidemia)   . HTN (hypertension)   . DMII (diabetes mellitus, type 2)   . Irritable bowel syndrome   . Varices of other sites   . Fibroids     uterine   Past Surgical History  Procedure Laterality Date  . Tonsillectomy    . Endometrial biopsy  10/01    negative  . Colonoscopy  9/07    negative   History  Substance Use Topics  . Smoking status: Never Smoker   . Smokeless tobacco: Never Used  . Alcohol Use: Yes     Comment: wine every other day   Family History  Problem Relation Age of Onset  . Coronary artery disease Mother   . Hyperlipidemia Mother   . Hypertension Mother   . Osteoporosis Mother   . Heart attack Mother     CABG in her 57's  . Diabetes Father   . Heart failure Father   . Hypertension Father   . Transient ischemic attack Father   . Diabetes      GF  . Hypertension Brother   . Heart murmur Son   . Brain cancer Sister    Allergies  Allergen Reactions  . Ciprofloxacin     Arm numbness  .  Sulfonamide Derivatives     Hives, rash   Current Outpatient Prescriptions on File Prior to Visit  Medication Sig Dispense Refill  . aspirin 81 MG tablet Take 81 mg by mouth daily.        [name] atorvastatin (LIPITOR) 40 MG tablet Take 1 tablet (40 mg total) by mouth daily.  30 tablet  11  . cromolyn (NASALCROM) 5.2 MG/ACT nasal spray Place 2 sprays into the nose daily as needed.  13 mL  11  . glipiZIDE (GLUCOTROL XL) 10 MG 24 hr tablet Take 1 tablet (10 mg total) by mouth daily.  30 tablet  11  . glucose blood (ONE TOUCH ULTRA TEST) test strip Use as instructed  100 each  5  . loratadine (CLARITIN) 10 MG tablet Take 10 mg by mouth daily as needed.        [name] losartan (COZAAR) 50 MG tablet Take 1 tablet (50 mg total) by mouth daily.  30 tablet  11  . Multiple Vitamin (MULTIVITAMIN) tablet Take 1 tablet by mouth daily.        . Omega-3 Fatty Acids (FISH OIL) 1200 MG CAPS Take 2 capsules  by mouth daily.        [name] omeprazole (PRILOSEC) 20 MG capsule Take 1 capsule (20 mg total) by mouth daily.  30 capsule  11  . sitaGLIPtan-metformin (JANUMET) 50-1000 MG per tablet Take 1 tablet by mouth 2 (two) times daily with a meal.         No current facility-administered medications on file prior to visit.      Review of Systems Review of Systems  Constitutional: Negative for fever, appetite change, fatigue and unexpected weight change.  Eyes: Negative for pain and visual disturbance.  Respiratory: Negative for cough and shortness of breath.   Cardiovascular: Negative for cp or palpitations    Gastrointestinal: Negative for nausea, diarrhea and constipation.  Genitourinary: Negative for urgency and frequency.  Skin: Negative for pallor and pos for rash Neurological: Negative for weakness, light-headedness, numbness and headaches.  Hematological: Negative for adenopathy. Does not bruise/bleed easily.  Psychiatric/Behavioral: Negative for dysphoric mood. The patient is not nervous/anxious.          Objective:   Physical Exam  Constitutional: She appears well-developed and well-nourished. No distress.  HENT:  Head: Normocephalic and atraumatic.  Mouth/Throat: Oropharynx is clear and moist.  Eyes: Conjunctivae and EOM are normal. Pupils are equal, round, and reactive to light. No scleral icterus.  Neck: Normal range of motion. Neck supple. No thyromegaly present.  Cardiovascular: Normal rate.   Pulmonary/Chest: Effort normal and breath sounds normal. She has no wheezes.  Musculoskeletal: She exhibits no edema and no tenderness.  Lymphadenopathy:    She has no cervical adenopathy.  Neurological: She is alert.  Skin: Skin is warm and dry. Rash noted. There is erythema.  Faint erythematous rash on legs from mid thigh to ankle - pink/ slt raised and scaley No whelps or excoriation noted   Psychiatric: She has a normal mood and affect.          Assessment & Plan:

## 2012-06-03 NOTE — Patient Instructions (Addendum)
Get dove soap for sensitive skin - to wash and shave with  Avoid baths for a while - and definitely no bubble baths For moisturizer try lubriderm for sensitive skin or eucerin  Avoid fabric softener , and if possible wash clothes in a "free" detergent  Continue claritin, benadryl is ok if needed  Use the triamcinolone cream in small amount as needed

## 2012-08-08 ENCOUNTER — Encounter: Payer: Self-pay | Admitting: Family Medicine

## 2012-08-08 ENCOUNTER — Encounter: Payer: Self-pay | Admitting: *Deleted

## 2012-08-08 ENCOUNTER — Ambulatory Visit: Payer: Self-pay | Admitting: Family Medicine

## 2012-08-08 DIAGNOSIS — Z1231 Encounter for screening mammogram for malignant neoplasm of breast: Secondary | ICD-10-CM | POA: Diagnosis not present

## 2012-09-30 DIAGNOSIS — E7800 Pure hypercholesterolemia, unspecified: Secondary | ICD-10-CM | POA: Diagnosis not present

## 2012-10-09 DIAGNOSIS — E7800 Pure hypercholesterolemia, unspecified: Secondary | ICD-10-CM | POA: Diagnosis not present

## 2012-10-09 DIAGNOSIS — I10 Essential (primary) hypertension: Secondary | ICD-10-CM | POA: Diagnosis not present

## 2012-10-16 ENCOUNTER — Ambulatory Visit (INDEPENDENT_AMBULATORY_CARE_PROVIDER_SITE_OTHER): Payer: Medicare Other | Admitting: Family Medicine

## 2012-10-16 ENCOUNTER — Encounter: Payer: Self-pay | Admitting: Family Medicine

## 2012-10-16 VITALS — BP 128/76 | HR 78 | Temp 98.6°F | Ht 64.0 in | Wt 148.0 lb

## 2012-10-16 DIAGNOSIS — B9689 Other specified bacterial agents as the cause of diseases classified elsewhere: Secondary | ICD-10-CM | POA: Insufficient documentation

## 2012-10-16 DIAGNOSIS — J019 Acute sinusitis, unspecified: Secondary | ICD-10-CM | POA: Diagnosis not present

## 2012-10-16 MED ORDER — AMOXICILLIN-POT CLAVULANATE 875-125 MG PO TABS: 1.0000 | ORAL_TABLET | Freq: Two times a day (BID) | ORAL | Status: DC

## 2012-10-16 MED ORDER — FLUTICASONE PROPIONATE 50 MCG/ACT NA SUSP: 2.0000 | Freq: Every day | NASAL | Status: DC

## 2012-10-16 NOTE — Patient Instructions (Addendum)
For sinus infection Ibuprofen for headache and fever Take augmentin as directed  Warm compresses on face can help Try flonase for ear pain and congestion Update if not starting to improve in a week or if worsening

## 2012-10-16 NOTE — Assessment & Plan Note (Signed)
With congestion and ear pain for over a week and now sinus pain with fever (pt is diabetic) Cover with augmentin  Disc symptomatic care - see instructions on AVS  flonase daily for ETD/ congestion  Update if not starting to improve in a week or if worsening

## 2012-10-16 NOTE — Progress Notes (Signed)
Subjective:    Patient ID: Donna Murphy, female    DOB: Jul 12, 1945, 67 y.o.   MRN: [id number]  HPI Here for fever and ear pain  Going on for a week -ear pain and fatigue- both ears are about the same  Sinus headache is frontal  Last night - throat felt swollen and sore  No colored nasal drainage now - had some last week  Fever started yesterday - low grade  Today neck feels "thick" and touchy  Cough comes and goes - a bit worse now   Was [place] the beach- used meds for swimmers ear -no helps   Patient Active Problem List   Diagnosis Date Noted  . Rash and nonspecific skin eruption 06/03/2012  . Routine general medical examination at a health care facility 05/24/2011  . Other screening mammogram 05/24/2011  . Gynecological examination 05/24/2011  . DM, UNCOMPLICATED, TYPE II 12/04/2006  . HYPERLIPIDEMIA 07/12/2006  . HYPERTENSION 07/12/2006  . VARICOSE VEIN 07/12/2006  . ALLERGIC RHINITIS 07/12/2006  . GERD 07/12/2006  . IRRITABLE BOWEL SYNDROME 07/12/2006   Past Medical History  Diagnosis Date  . Allergic rhinitis   . GERD (gastroesophageal reflux disease)   . HLD (hyperlipidemia)   . HTN (hypertension)   . DMII (diabetes mellitus, type 2)   . Irritable bowel syndrome   . Varices of other sites   . Fibroids     uterine   Past Surgical History  Procedure Laterality Date  . Tonsillectomy    . Endometrial biopsy  10/01    negative  . Colonoscopy  9/07    negative   History  Substance Use Topics  . Smoking status: Never Smoker   . Smokeless tobacco: Never Used  . Alcohol Use: Yes     Comment: wine every other day   Family History  Problem Relation Age of Onset  . Coronary artery disease Mother   . Hyperlipidemia Mother   . Hypertension Mother   . Osteoporosis Mother   . Heart attack Mother     CABG in her 60's  . Diabetes Father   . Heart failure Father   . Hypertension Father   . Transient ischemic attack Father   . Diabetes      GF  . Hypertension  Brother   . Heart murmur Son   . Brain cancer Sister    Allergies  Allergen Reactions  . Ciprofloxacin     Arm numbness  . Sulfonamide Derivatives     Hives, rash   Current Outpatient Prescriptions on File Prior to Visit  Medication Sig Dispense Refill  . aspirin 81 MG tablet Take 81 mg by mouth daily.        [name] atorvastatin (LIPITOR) 40 MG tablet Take 1 tablet (40 mg total) by mouth daily.  30 tablet  11  . Cholecalciferol (VITAMIN D3) 400 UNITS CAPS Take 1 tablet by mouth daily.      . cromolyn (NASALCROM) 5.2 MG/ACT nasal spray Place 2 sprays into the nose daily as needed.  13 mL  11  . glucose blood (ONE TOUCH ULTRA TEST) test strip Use as instructed  100 each  5  . loratadine (CLARITIN) 10 MG tablet Take 10 mg by mouth daily as needed.        [name] losartan (COZAAR) 50 MG tablet Take 1 tablet (50 mg total) by mouth daily.  30 tablet  11  . Multiple Vitamin (MULTIVITAMIN) tablet Take 1 tablet by mouth daily.        [name]  Omega-3 Fatty Acids (FISH OIL) 1200 MG CAPS Take 2 capsules by mouth daily.        [name] omeprazole (PRILOSEC) 20 MG capsule Take 1 capsule (20 mg total) by mouth daily.  30 capsule  11  . sitaGLIPtan-metformin (JANUMET) 50-1000 MG per tablet Take 1 tablet by mouth 2 (two) times daily with a meal.        . triamcinolone (KENALOG) 0.025 % cream Apply topically daily as needed. Apply to affected areas once daily as needed  30 g  1   No current facility-administered medications on file prior to visit.    Review of Systems Review of Systems  Constitutional: Negative for  appetite change,  and unexpected weight change. pos for fatigue and fever ENT neg for ear drainage , pos for facial pain  Eyes: Negative for pain and visual disturbance.  Respiratory: Negative for wheeze  and shortness of breath.   Cardiovascular: Negative for cp or palpitations    Gastrointestinal: Negative for nausea, diarrhea and constipation.  Genitourinary: Negative for urgency and frequency.  Skin:  Negative for pallor or rash   Neurological: Negative for weakness, light-headedness, numbness and headaches.  Hematological: Negative for adenopathy. Does not bruise/bleed easily.  Psychiatric/Behavioral: Negative for dysphoric mood. The patient is not nervous/anxious.         Objective:   Physical Exam  Constitutional: She appears well-developed and well-nourished. No distress.  HENT:  Head: Normocephalic and atraumatic.  Right Ear: External ear normal.  Left Ear: External ear normal.  Mouth/Throat: Oropharynx is clear and moist. No oropharyngeal exudate.  Nares are injected and congested  Very mild maxillary sinus tenderness TMs dull with small eff/no redness Throat-post nasal drip  Eyes: Conjunctivae and EOM are normal. Pupils are equal, round, and reactive to light. Right eye exhibits no discharge. Left eye exhibits no discharge. No scleral icterus.  Neck: Normal range of motion. Neck supple. No thyromegaly present.  Cardiovascular: Normal rate and regular rhythm.   Pulmonary/Chest: Effort normal and breath sounds normal. No respiratory distress. She has no wheezes. She has no rales.  Lymphadenopathy:    She has no cervical adenopathy.  Neurological: She is alert.  Skin: Skin is warm and dry. No rash noted. No erythema.  Psychiatric: She has a normal mood and affect.          Assessment & Plan:

## 2012-11-29 ENCOUNTER — Other Ambulatory Visit: Payer: Self-pay | Admitting: Family Medicine

## 2013-01-27 DIAGNOSIS — Z23 Encounter for immunization: Secondary | ICD-10-CM | POA: Diagnosis not present

## 2013-02-06 DIAGNOSIS — H43399 Other vitreous opacities, unspecified eye: Secondary | ICD-10-CM | POA: Diagnosis not present

## 2013-03-05 DIAGNOSIS — E7800 Pure hypercholesterolemia, unspecified: Secondary | ICD-10-CM | POA: Diagnosis not present

## 2013-03-05 DIAGNOSIS — I10 Essential (primary) hypertension: Secondary | ICD-10-CM | POA: Diagnosis not present

## 2013-04-04 ENCOUNTER — Other Ambulatory Visit: Payer: Self-pay | Admitting: Family Medicine

## 2013-04-09 DIAGNOSIS — M6789 Other specified disorders of synovium and tendon, multiple sites: Secondary | ICD-10-CM | POA: Diagnosis not present

## 2013-04-14 DIAGNOSIS — M6789 Other specified disorders of synovium and tendon, multiple sites: Secondary | ICD-10-CM | POA: Diagnosis not present

## 2013-04-21 DIAGNOSIS — M6789 Other specified disorders of synovium and tendon, multiple sites: Secondary | ICD-10-CM | POA: Diagnosis not present

## 2013-05-05 DIAGNOSIS — M6789 Other specified disorders of synovium and tendon, multiple sites: Secondary | ICD-10-CM | POA: Diagnosis not present

## 2013-06-02 ENCOUNTER — Other Ambulatory Visit: Payer: Self-pay | Admitting: Family Medicine

## 2013-06-04 ENCOUNTER — Other Ambulatory Visit: Payer: Self-pay | Admitting: Family Medicine

## 2013-06-23 ENCOUNTER — Ambulatory Visit: Payer: Medicare Other | Admitting: Family Medicine

## 2013-06-24 ENCOUNTER — Encounter: Payer: Self-pay | Admitting: Family Medicine

## 2013-06-24 ENCOUNTER — Ambulatory Visit (INDEPENDENT_AMBULATORY_CARE_PROVIDER_SITE_OTHER): Payer: Medicare Other | Admitting: Family Medicine

## 2013-06-24 VITALS — BP 138/84 | HR 81 | Temp 98.4°F | Ht 64.0 in | Wt 151.5 lb

## 2013-06-24 DIAGNOSIS — E785 Hyperlipidemia, unspecified: Secondary | ICD-10-CM

## 2013-06-24 DIAGNOSIS — I10 Essential (primary) hypertension: Secondary | ICD-10-CM | POA: Diagnosis not present

## 2013-06-24 MED ORDER — LOSARTAN POTASSIUM 50 MG PO TABS: 50.0000 mg | ORAL_TABLET | Freq: Every day | ORAL | Status: DC

## 2013-06-24 MED ORDER — ATORVASTATIN CALCIUM 40 MG PO TABS: 40.0000 mg | ORAL_TABLET | Freq: Every day | ORAL | Status: DC

## 2013-06-24 NOTE — Progress Notes (Signed)
Pre visit review using our clinic review tool, if applicable. No additional management support is needed unless otherwise documented below in the visit note. 

## 2013-06-24 NOTE — Progress Notes (Signed)
Subjective:    Patient ID: Donna Murphy, female    DOB: 1946-03-21, 68 y.o.   MRN: [id number]  HPI  Here for f/u of chronic health problems   Had a cold - and her R ear gets a sharp pain occasionally   Doing ok overall  Lot of stress with work   [employer] is up 3 lb with bmi of 44  Sees Dr Chalmers Cater for her DM Goes back in the summer  Her last A1C was "within the normal" this last time   Has a PE with me in July    bp is stable today  No cp or palpitations or headaches or edema  No side effects to medicines  Is also stable at Dr Almetta Lovely office  BP Readings from Last 3 Encounters:  06/24/13 138/84  10/16/12 128/76  06/03/12 132/84     Hyperlipidemia  On lipitor and diet  Lipids from last summer - chol 156 and trig 74, LDL 82 and HDL 59  Overall a very good profile  Dr Chalmers Cater will check her cholesterol labs at her next visit    Patient Active Problem List   Diagnosis Date Noted  . Rash and nonspecific skin eruption 06/03/2012  . Routine general medical examination at a health care facility 05/24/2011  . Other screening mammogram 05/24/2011  . Gynecological examination 05/24/2011  . DM, UNCOMPLICATED, TYPE II 78/46/9629  . HYPERLIPIDEMIA 07/12/2006  . HYPERTENSION 07/12/2006  . VARICOSE VEIN 07/12/2006  . ALLERGIC RHINITIS 07/12/2006  . GERD 07/12/2006  . IRRITABLE BOWEL SYNDROME 07/12/2006   Past Medical History  Diagnosis Date  . Allergic rhinitis   . GERD (gastroesophageal reflux disease)   . HLD (hyperlipidemia)   . HTN (hypertension)   . DMII (diabetes mellitus, type 2)   . Irritable bowel syndrome   . Varices of other sites   . Fibroids     uterine   Past Surgical History  Procedure Laterality Date  . Tonsillectomy    . Endometrial biopsy  10/01    negative  . Colonoscopy  9/07    negative   History  Substance Use Topics  . Smoking status: Never Smoker   . Smokeless tobacco: Never Used  . Alcohol Use: Yes     Comment: wine every other day    Family History  Problem Relation Age of Onset  . Coronary artery disease Mother   . Hyperlipidemia Mother   . Hypertension Mother   . Osteoporosis Mother   . Heart attack Mother     CABG in her 47's  . Diabetes Father   . Heart failure Father   . Hypertension Father   . Transient ischemic attack Father   . Diabetes      GF  . Hypertension Brother   . Heart murmur Son   . Brain cancer Sister    Allergies  Allergen Reactions  . Ciprofloxacin     Arm numbness  . Sulfonamide Derivatives     Hives, rash   Current Outpatient Prescriptions on File Prior to Visit  Medication Sig Dispense Refill  . aspirin 81 MG tablet Take 81 mg by mouth daily.        . Cholecalciferol (VITAMIN D3) 400 UNITS CAPS Take 1 tablet by mouth daily.      . cromolyn (NASALCROM) 5.2 MG/ACT nasal spray Place 2 sprays into the nose daily as needed.  13 mL  11  . fluticasone (FLONASE) 50 MCG/ACT nasal spray Place 2 sprays into  the nose daily.  16 g  6  . glimepiride (AMARYL) 4 MG tablet Take 4 mg by mouth 2 (two) times daily with a meal.      . glucose blood (ONE TOUCH ULTRA TEST) test strip Check blood sugar once daily and prn (follow-up with doctor required before future refills are given)  50 each  0  . loratadine (CLARITIN) 10 MG tablet Take 10 mg by mouth daily as needed.        . Multiple Vitamin (MULTIVITAMIN) tablet Take 1 tablet by mouth daily.        . Omega-3 Fatty Acids (FISH OIL) 1200 MG CAPS Take 2 capsules by mouth daily.        [name] omeprazole (PRILOSEC) 20 MG capsule Take 1 capsule (20 mg total) by mouth daily.  30 capsule  11  . sitaGLIPtan-metformin (JANUMET) 50-1000 MG per tablet Take 1 tablet by mouth 2 (two) times daily with a meal.        . triamcinolone (KENALOG) 0.025 % cream Apply topically daily as needed. Apply to affected areas once daily as needed  30 g  1   No current facility-administered medications on file prior to visit.    Review of Systems Review of Systems   Constitutional: Negative for fever, appetite change, fatigue and unexpected weight change.  Eyes: Negative for pain and visual disturbance.  Respiratory: Negative for cough and shortness of breath.   Cardiovascular: Negative for cp or palpitations    Gastrointestinal: Negative for nausea, diarrhea and constipation.  Genitourinary: Negative for urgency and frequency.  Skin: Negative for pallor or rash   Neurological: Negative for weakness, light-headedness, numbness and headaches.  Hematological: Negative for adenopathy. Does not bruise/bleed easily.  Psychiatric/Behavioral: Negative for dysphoric mood. The patient is not nervous/anxious.         Objective:   Physical Exam  Constitutional: She appears well-developed and well-nourished.  HENT:  Head: Normocephalic and atraumatic.  Mouth/Throat: Oropharynx is clear and moist.  Eyes: Conjunctivae and EOM are normal. Pupils are equal, round, and reactive to light. No scleral icterus.  Neck: Normal range of motion. Neck supple. No JVD present. Carotid bruit is not present. No thyromegaly present.  Cardiovascular: Normal rate, regular rhythm, normal heart sounds and intact distal pulses.  Exam reveals no gallop.   Pulmonary/Chest: Effort normal and breath sounds normal. No respiratory distress. She has no wheezes. She has no rales.  Abdominal: Soft. Bowel sounds are normal. She exhibits no distension, no abdominal bruit and no mass. There is no tenderness.  Lymphadenopathy:    She has no cervical adenopathy.  Neurological: She is alert. She has normal reflexes.  Skin: Skin is warm and dry. No rash noted. No erythema. No pallor.  Psychiatric: She has a normal mood and affect.          Assessment & Plan:

## 2013-06-24 NOTE — Patient Instructions (Signed)
Get some stockings with support  Keep trying to eat a healthy diet and get enough exercise  Take care of yourself

## 2013-06-25 ENCOUNTER — Telehealth: Payer: Self-pay | Admitting: Family Medicine

## 2013-06-25 NOTE — Telephone Encounter (Signed)
Relevant patient education assigned to patient using Emmi. ° °

## 2013-06-25 NOTE — Assessment & Plan Note (Signed)
bp in fair control at this time  BP Readings from Last 1 Encounters:  06/24/13 138/84   No changes needed Disc lifstyle change with low sodium diet and exercise  meds refilled

## 2013-06-25 NOTE — Assessment & Plan Note (Signed)
On lipitor and diet fair control  Disc goals for lipids and reasons to control them Rev labs with pt- from last summer- for draw soon upcoming at Dr Almetta Lovely office  Med refilled  Rev low sat fat diet in detail

## 2013-07-14 ENCOUNTER — Encounter: Payer: Self-pay | Admitting: Internal Medicine

## 2013-07-14 ENCOUNTER — Ambulatory Visit (INDEPENDENT_AMBULATORY_CARE_PROVIDER_SITE_OTHER): Payer: Medicare Other | Admitting: Internal Medicine

## 2013-07-14 VITALS — BP 110/74 | HR 84 | Temp 98.4°F | Wt 149.0 lb

## 2013-07-14 DIAGNOSIS — W57XXXA Bitten or stung by nonvenomous insect and other nonvenomous arthropods, initial encounter: Principal | ICD-10-CM

## 2013-07-14 DIAGNOSIS — S30860A Insect bite (nonvenomous) of lower back and pelvis, initial encounter: Secondary | ICD-10-CM

## 2013-07-14 DIAGNOSIS — S30861A Insect bite (nonvenomous) of abdominal wall, initial encounter: Secondary | ICD-10-CM

## 2013-07-14 MED ORDER — DOXYCYCLINE HYCLATE 100 MG PO TABS: 100.0000 mg | ORAL_TABLET | Freq: Two times a day (BID) | ORAL | Status: DC

## 2013-07-14 NOTE — Patient Instructions (Addendum)
Tick Bite Information Ticks are insects that attach themselves to the skin and draw blood for food. There are various types of ticks. Common types include wood ticks and deer ticks. Most ticks live in shrubs and grassy areas. Ticks can climb onto your body when you make contact with leaves or grass where the tick is waiting. The most common places on the body for ticks to attach themselves are the scalp, neck, armpits, waist, and groin. Most tick bites are harmless, but sometimes ticks carry germs that cause diseases. These germs can be spread to a person during the tick's feeding process. The chance of a disease spreading through a tick bite depends on:   The type of tick.  Time of year.   How long the tick is attached.   Geographic location.  HOW CAN YOU PREVENT TICK BITES? Take these steps to help prevent tick bites when you are outdoors:  Wear protective clothing. Long sleeves and long pants are best.   Wear white clothes so you can see ticks more easily.  Tuck your pant legs into your socks.   If walking on a trail, stay in the middle of the trail to avoid brushing against bushes.  Avoid walking through areas with long grass.  Put insect repellent on all exposed skin and along boot tops, pant legs, and sleeve cuffs.   Check clothing, hair, and skin repeatedly and before going inside.   Brush off any ticks that are not attached.  Take a shower or bath as soon as possible after being outdoors.  WHAT IS THE PROPER WAY TO REMOVE A TICK? Ticks should be removed as soon as possible to help prevent diseases caused by tick bites. 1. If latex gloves are available, put them on before trying to remove a tick.  2. Using fine-point tweezers, grasp the tick as close to the skin as possible. You may also use curved forceps or a tick removal tool. Grasp the tick as close to its head as possible. Avoid grasping the tick on its body. 3. Pull gently with steady upward pressure until  the tick lets go. Do not twist the tick or jerk it suddenly. This may break off the tick's head or mouth parts. 4. Do not squeeze or crush the tick's body. This could force disease-carrying fluids from the tick into your body.  5. After the tick is removed, wash the bite area and your hands with soap and water or other disinfectant such as alcohol. 6. Apply a small amount of antiseptic cream or ointment to the bite site.  7. Wash and disinfect any instruments that were used.  Do not try to remove a tick by applying a hot match, petroleum jelly, or fingernail polish to the tick. These methods do not work and may increase the chances of disease being spread from the tick bite.  WHEN SHOULD YOU SEEK MEDICAL CARE? Contact your health care provider if you are unable to remove a tick from your skin or if a part of the tick breaks off and is stuck in the skin.  After a tick bite, you need to be aware of signs and symptoms that could be related to diseases spread by ticks. Contact your health care provider if you develop any of the following in the days or weeks after the tick bite:  Unexplained fever.  Rash. A circular rash that appears days or weeks after the tick bite may indicate the possibility of Lyme disease. The rash may resemble   a target with a bull's-eye and may occur at a different part of your body than the tick bite.  Redness and swelling in the area of the tick bite.   Tender, swollen lymph glands.   Diarrhea.   Weight loss.   Cough.   Fatigue.   Muscle, joint, or bone pain.   Abdominal pain.   Headache.   Lethargy or a change in your level of consciousness.  Difficulty walking or moving your legs.   Numbness in the legs.   Paralysis.  Shortness of breath.   Confusion.   Repeated vomiting.  Document Released: 03/17/2000 Document Revised: 01/08/2013 Document Reviewed: 08/28/2012 [employer] Patient Information 2014 [employer].  

## 2013-07-14 NOTE — Progress Notes (Signed)
Subjective:    Patient ID: Donna Murphy, female    DOB: [birth year], 68 y.o.   MRN: [id number]  HPI  Pt presents to the clinic today with c/o a tick bite. She reports this occurred Thursday while working out in the yard. She noticed it on Friday. Her husband tried to pull it out but thinks the head may have gotten stuck. She has noticed some redness around the area. She denies fever, chills and body aches.  Review of Systems      Past Medical History  Diagnosis Date  . Allergic rhinitis   . GERD (gastroesophageal reflux disease)   . HLD (hyperlipidemia)   . HTN (hypertension)   . DMII (diabetes mellitus, type 2)   . Irritable bowel syndrome   . Varices of other sites   . Fibroids     uterine    Current Outpatient Prescriptions  Medication Sig Dispense Refill  . aspirin 81 MG tablet Take 81 mg by mouth daily.        [name] atorvastatin (LIPITOR) 40 MG tablet Take 1 tablet (40 mg total) by mouth daily.  30 tablet  11  . Cholecalciferol (VITAMIN D3) 400 UNITS CAPS Take 1 tablet by mouth daily.      . cromolyn (NASALCROM) 5.2 MG/ACT nasal spray Place 2 sprays into the nose daily as needed.  13 mL  11  . fluticasone (FLONASE) 50 MCG/ACT nasal spray Place 2 sprays into the nose daily.  16 g  6  . glimepiride (AMARYL) 4 MG tablet Take 4 mg by mouth 2 (two) times daily with a meal.      . glucose blood (ONE TOUCH ULTRA TEST) test strip Check blood sugar once daily and prn (follow-up with doctor required before future refills are given)  50 each  0  . loratadine (CLARITIN) 10 MG tablet Take 10 mg by mouth daily as needed.        [name] losartan (COZAAR) 50 MG tablet Take 1 tablet (50 mg total) by mouth daily.  30 tablet  11  . Multiple Vitamin (MULTIVITAMIN) tablet Take 1 tablet by mouth daily.        . Omega-3 Fatty Acids (FISH OIL) 1200 MG CAPS Take 2 capsules by mouth daily.        [name] omeprazole (PRILOSEC) 20 MG capsule Take 1 capsule (20 mg total) by mouth daily.  30 capsule  11  .  sitaGLIPtan-metformin (JANUMET) 50-1000 MG per tablet Take 1 tablet by mouth 2 (two) times daily with a meal.        . triamcinolone (KENALOG) 0.025 % cream Apply topically daily as needed. Apply to affected areas once daily as needed  30 g  1   No current facility-administered medications for this visit.    Allergies  Allergen Reactions  . Ciprofloxacin     Arm numbness  . Sulfonamide Derivatives     Hives, rash    Family History  Problem Relation Age of Onset  . Coronary artery disease Mother   . Hyperlipidemia Mother   . Hypertension Mother   . Osteoporosis Mother   . Heart attack Mother     CABG in her 47's  . Diabetes Father   . Heart failure Father   . Hypertension Father   . Transient ischemic attack Father   . Diabetes      GF  . Hypertension Brother   . Heart murmur Son   . Brain cancer Sister  History   Social History  . Marital Status: Married    Spouse Name: N/A    Number of Children: N/A  . Years of Education: N/A   Occupational History  . Not on file.   Social History Main Topics  . Smoking status: Never Smoker   . Smokeless tobacco: Never Used  . Alcohol Use: Yes     Comment: wine every other day  . Drug Use: No  . Sexual Activity: Not on file   Other Topics Concern  . Not on file   Social History Narrative   Walks for exercise; does yardwork too           Constitutional: Denies fever, malaise, fatigue, headache or abrupt weight changes.  Skin: Pt reports tick bite on right side of abdomen. Denies rashes, lesions or ulcercations.  Neurological: Denies dizziness, difficulty with memory, difficulty with speech or problems with balance and coordination.   No other specific complaints in a complete review of systems (except as listed in HPI above).  Objective:   Physical Exam   BP 110/74  Pulse 84  Temp(Src) 98.4 F (36.9 C) (Oral)  Wt 149 lb (67.586 kg) Wt Readings from Last 3 Encounters:  07/14/13 149 lb (67.586 kg)    06/24/13 151 lb 8 oz (68.72 kg)  10/16/12 148 lb (67.132 kg)    General: Appears her stated age, well developed, well nourished in NAD. Skin: Small lesion noted on right abdomen surrounded by area of cellulitis, no erythema migrans noted. Cardiovascular: Normal rate and rhythm. S1,S2 noted.  No murmur, rubs or gallops noted. No JVD or BLE edema. No carotid bruits noted. Pulmonary/Chest: Normal effort and positive vesicular breath sounds. No respiratory distress. No wheezes, rales or ronchi noted.   BMET    Component Value Date/Time   NA 141 02/08/2011 1014   K 4.6 02/08/2011 1014   CL 105 02/08/2011 1014   CO2 29 02/08/2011 1014   GLUCOSE 141* 02/08/2011 1014   BUN 23 02/08/2011 1014   CREATININE 0.7 02/08/2011 1014   CALCIUM 9.5 02/08/2011 1014   GFRNONAA 90.10 12/24/2008 0850   GFRAA 131 02/13/2008 0957    Lipid Panel     Component Value Date/Time   CHOL 140 02/08/2011 1014   TRIG 75.0 02/08/2011 1014   HDL 56.80 02/08/2011 1014   CHOLHDL 2 02/08/2011 1014   VLDL 15.0 02/08/2011 1014   LDLCALC 68 02/08/2011 1014    CBC    Component Value Date/Time   WBC 7.3 02/08/2011 1014   RBC 4.63 02/08/2011 1014   HGB 13.7 02/08/2011 1014   HCT 40.9 02/08/2011 1014   PLT 303.0 02/08/2011 1014   MCV 88.4 02/08/2011 1014   MCHC 33.4 02/08/2011 1014   RDW 13.2 02/08/2011 1014   LYMPHSABS 2.5 02/08/2011 1014   MONOABS 0.4 02/08/2011 1014   EOSABS 0.1 02/08/2011 1014   BASOSABS 0.0 02/08/2011 1014    Hgb A1C Lab Results  Component Value Date   HGBA1C 7.4* 02/13/2008        Assessment & Plan:   Tick Bite with cellulitis:  eRx for Doxy 100 mg BID x 10 days Watch for increased redness, swelling, fever, fatigue or body aches   RTC as needed or if symptoms persist or worsen

## 2013-07-14 NOTE — Progress Notes (Signed)
Pre visit review using our clinic review tool, if applicable. No additional management support is needed unless otherwise documented below in the visit note. 

## 2013-07-14 NOTE — Addendum Note (Signed)
Addended by: Jearld Fenton on: 07/14/2013 03:31 PM   Modules accepted: Orders

## 2013-08-27 ENCOUNTER — Other Ambulatory Visit: Payer: Self-pay | Admitting: Family Medicine

## 2013-10-07 IMAGING — MG MAM DGTL SCRN MAM NO ORDER W/CAD
1 series · 5 of 5 positions shown · non-contrast
Comparison: none

REASON FOR EXAM: SCR MAMMO NO ORDER
COMMENTS:

[R CC · right · 5 of 5 slices shown]
[im 1/5]
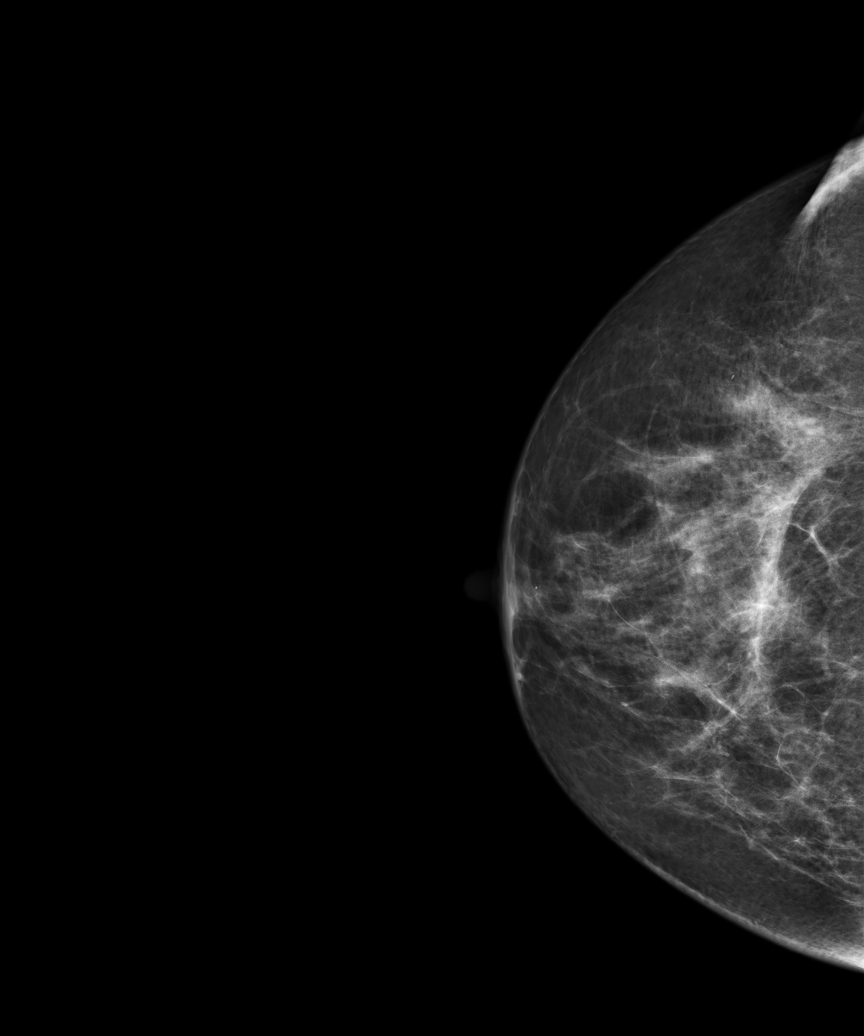
[im 2/5]
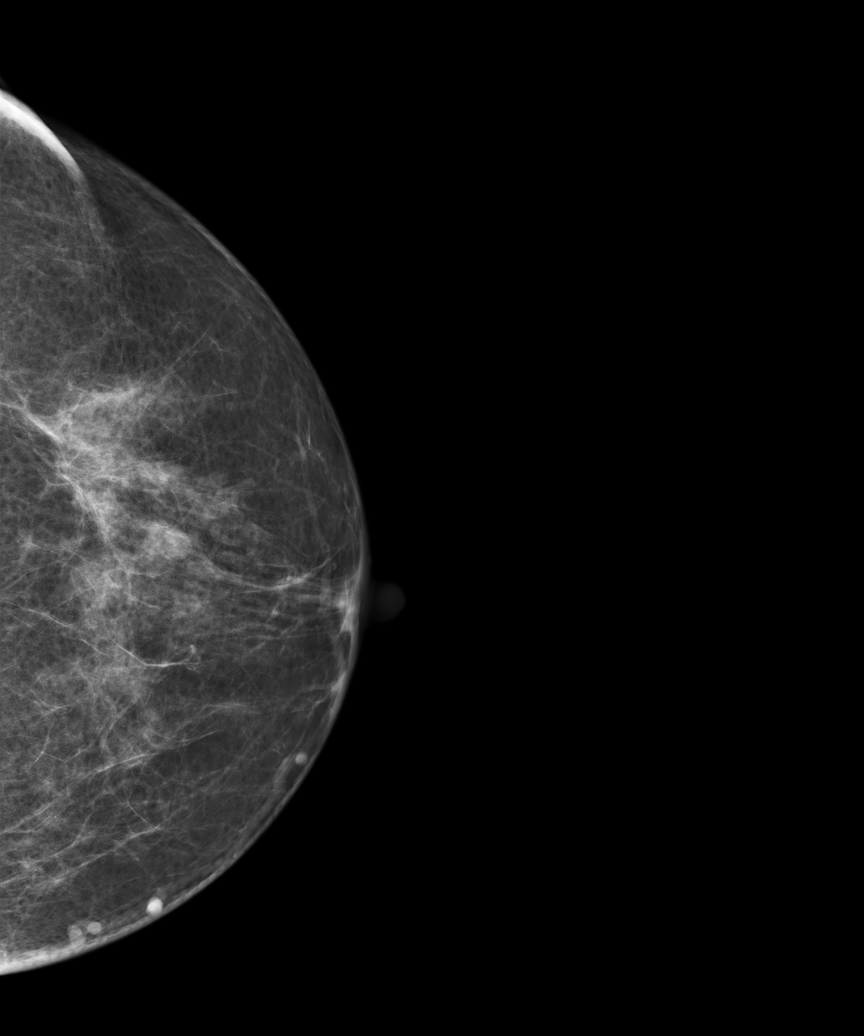
[im 3/5]
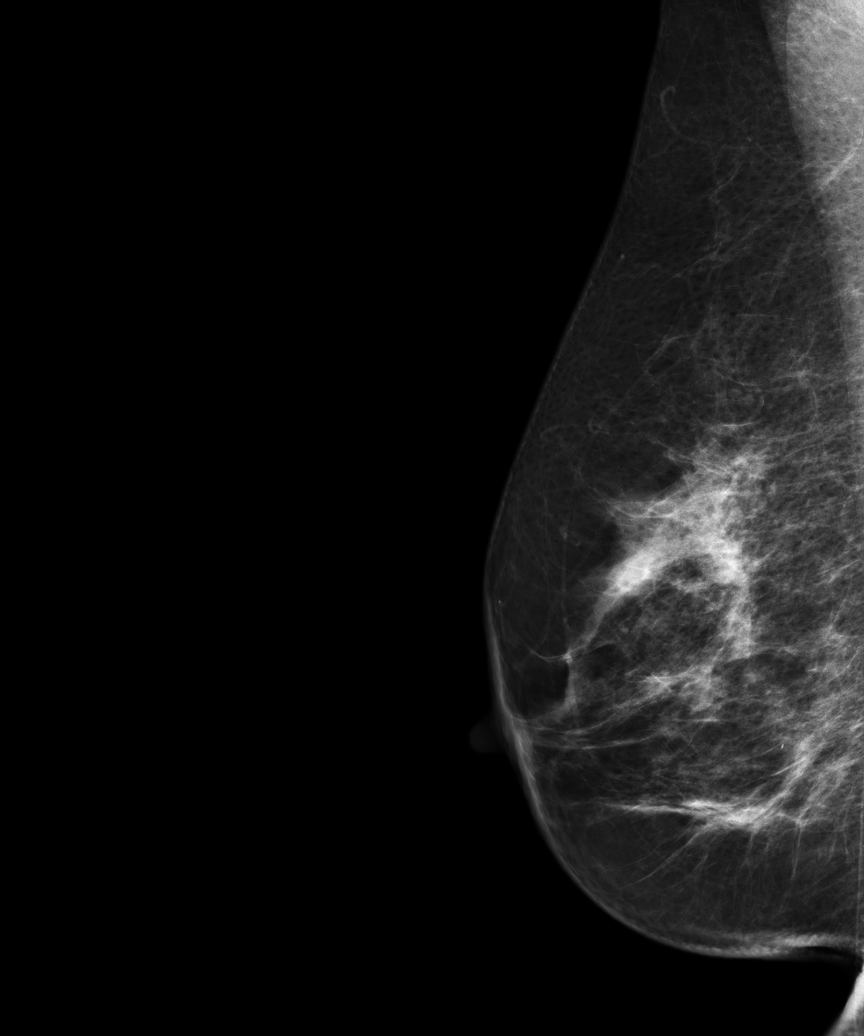
[im 4/5]
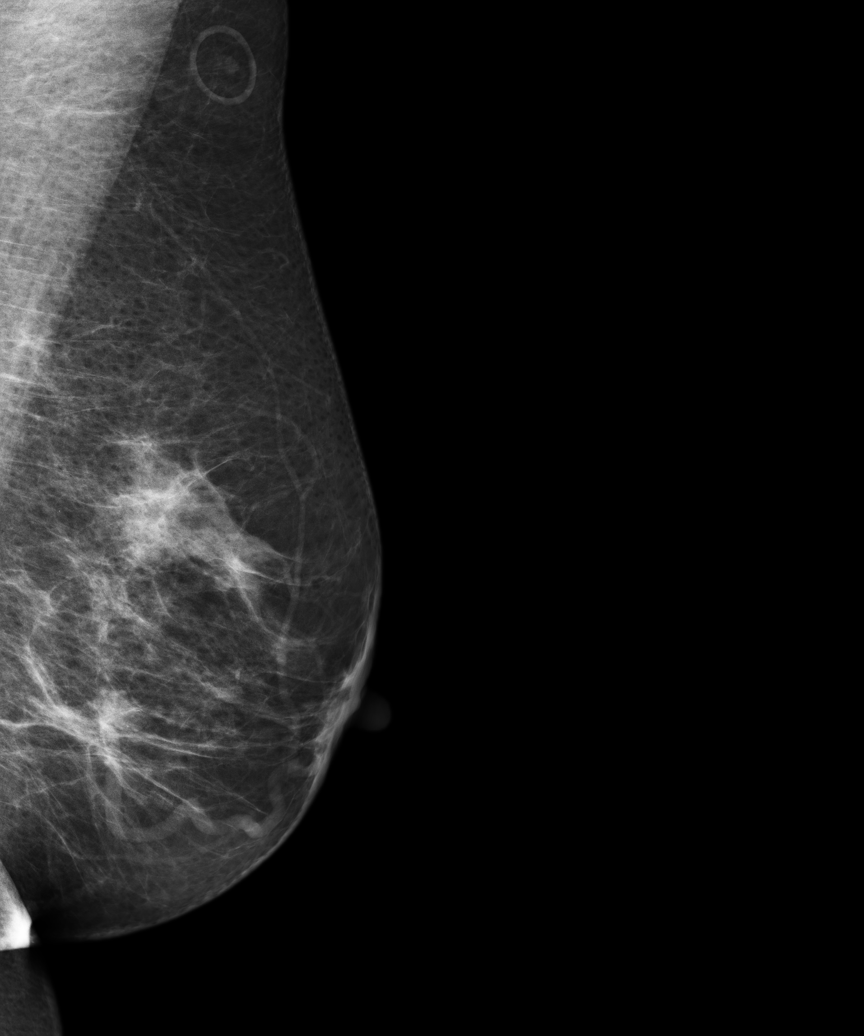
[im 5/5]
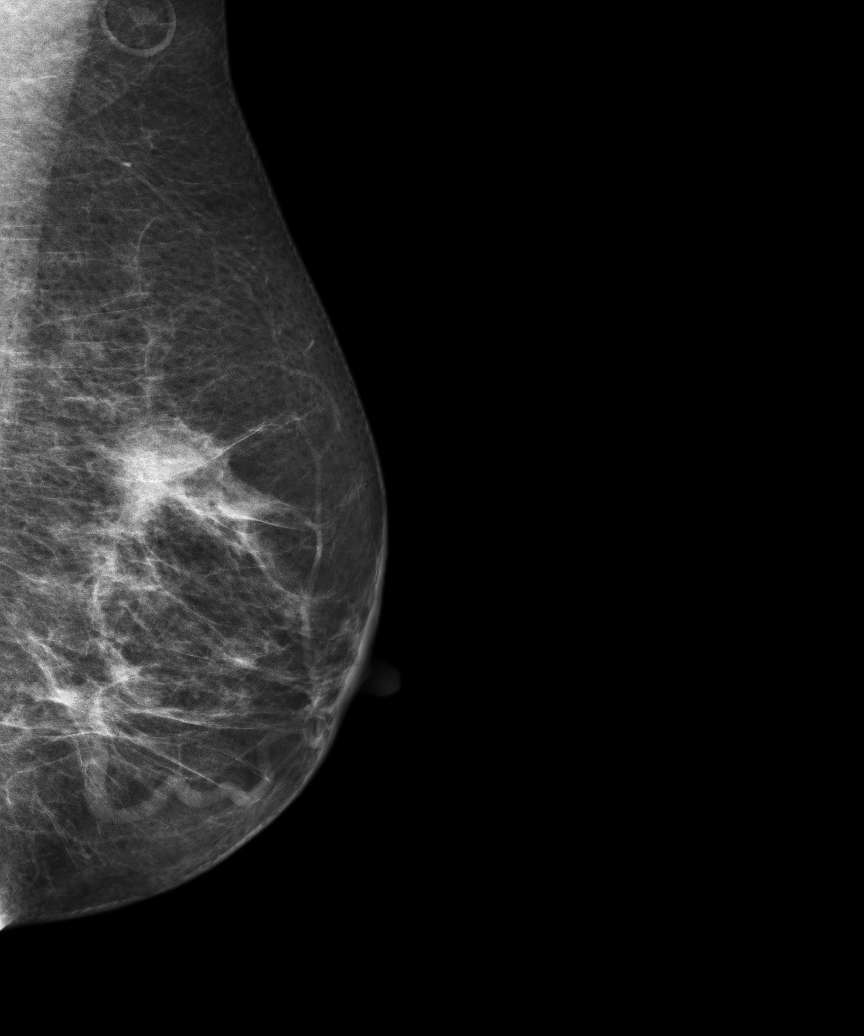

[5 of 5 positions shown; findings below may reference images not displayed]

PROCEDURE:     MAM - MAM DGTL SCRN MAM NO ORDER W/CAD  - August 08, 2012  [DATE]

RESULT:     There is no family history or personal history of breast cancer.
The patient denies previous breast surgery history. Comparison is made to
digital images from [REDACTED], [REDACTED] and August 2011. There is a heterogeneously dense pattern of parenchyma with benign
cast calcification. There is no developing density, dominant mass or
architectural distortion. The mammographic appearance is stable.
IMPRESSION: Stable, benign appearing bilateral mammogram. Please
continue to encourage annual mammographic followup and monthly breast self
exam. BREAST COMPOSITION: The breast composition is HETEROGENEOUSLY DENSE
(glandular tissue is 51-75%) This may decrease the sensitivity of
mammography.  BI-RADS: Category 2- Benign Finding

A NEGATIVE MAMMOGRAM REPORT DOES NOT PRECLUDE BIOPSY OR OTHER EVALUATION OF
A CLINICALLY PALPABLE OR OTHERWISE SUSPICIOUS MASS OR LESION. BREAST CANCER
MAY NOT BE DETECTED IN UP TO 10% OF CASES.

[REDACTED]

## 2013-11-09 ENCOUNTER — Telehealth: Payer: Self-pay | Admitting: Family Medicine

## 2013-11-09 DIAGNOSIS — I10 Essential (primary) hypertension: Secondary | ICD-10-CM

## 2013-11-09 DIAGNOSIS — E785 Hyperlipidemia, unspecified: Secondary | ICD-10-CM

## 2013-11-09 NOTE — Telephone Encounter (Signed)
Message copied by Abner Greenspan on Sun Nov 09, 2013 12:42 PM ------      Message from: Ellamae Sia      Created: Thu Nov 06, 2013  5:07 PM      Regarding: Lab orders for Monday, 8.10.15       Patient is scheduled for CPX labs, please order future labs, Thanks , Donna Murphy             ------

## 2013-11-10 ENCOUNTER — Other Ambulatory Visit (INDEPENDENT_AMBULATORY_CARE_PROVIDER_SITE_OTHER): Payer: Medicare Other

## 2013-11-10 DIAGNOSIS — I10 Essential (primary) hypertension: Secondary | ICD-10-CM | POA: Diagnosis not present

## 2013-11-10 DIAGNOSIS — Z0000 Encounter for general adult medical examination without abnormal findings: Secondary | ICD-10-CM | POA: Diagnosis not present

## 2013-11-10 DIAGNOSIS — E785 Hyperlipidemia, unspecified: Secondary | ICD-10-CM

## 2013-11-10 DIAGNOSIS — E119 Type 2 diabetes mellitus without complications: Secondary | ICD-10-CM | POA: Diagnosis not present

## 2013-11-10 LAB — CBC WITH DIFFERENTIAL/PLATELET
BASOS PCT: 0.5 % (ref 0.0–3.0)
Basophils Absolute: 0 10*3/uL (ref 0.0–0.1)
Eosinophils Absolute: 0.2 10*3/uL (ref 0.0–0.7)
Eosinophils Relative: 2.9 % (ref 0.0–5.0)
HCT: 41.3 % (ref 36.0–46.0)
Hemoglobin: 13.7 g/dL (ref 12.0–15.0)
LYMPHS ABS: 2.5 10*3/uL (ref 0.7–4.0)
Lymphocytes Relative: 33.9 % (ref 12.0–46.0)
MCHC: 33.2 g/dL (ref 30.0–36.0)
MCV: 86.5 fl (ref 78.0–100.0)
MONO ABS: 0.4 10*3/uL (ref 0.1–1.0)
Monocytes Relative: 5.2 % (ref 3.0–12.0)
Neutro Abs: 4.2 10*3/uL (ref 1.4–7.7)
Neutrophils Relative %: 57.5 % (ref 43.0–77.0)
Platelets: 367 10*3/uL (ref 150.0–400.0)
RBC: 4.78 Mil/uL (ref 3.87–5.11)
RDW: 13.4 % (ref 11.5–15.5)
WBC: 7.3 10*3/uL (ref 4.0–10.5)

## 2013-11-10 LAB — LIPID PANEL
Cholesterol: 153 mg/dL (ref 0–200)
HDL: 47.8 mg/dL (ref >39.00)
LDL Cholesterol: 81 mg/dL (ref 0–99)
NonHDL: 105.2
TRIGLYCERIDES: 120 mg/dL (ref 0.0–149.0)
Total CHOL/HDL Ratio: 3
VLDL: 24 mg/dL (ref 0.0–40.0)

## 2013-11-10 LAB — TSH: TSH: 0.78 u[IU]/mL (ref 0.35–4.50)

## 2013-11-12 ENCOUNTER — Encounter: Payer: Self-pay | Admitting: Family Medicine

## 2013-11-12 ENCOUNTER — Ambulatory Visit (INDEPENDENT_AMBULATORY_CARE_PROVIDER_SITE_OTHER): Payer: Medicare Other | Admitting: Family Medicine

## 2013-11-12 VITALS — BP 132/84 | HR 71 | Temp 98.7°F | Ht 63.75 in | Wt 145.0 lb

## 2013-11-12 DIAGNOSIS — L608 Other nail disorders: Secondary | ICD-10-CM | POA: Diagnosis not present

## 2013-11-12 DIAGNOSIS — I10 Essential (primary) hypertension: Secondary | ICD-10-CM | POA: Diagnosis not present

## 2013-11-12 DIAGNOSIS — E785 Hyperlipidemia, unspecified: Secondary | ICD-10-CM | POA: Diagnosis not present

## 2013-11-12 DIAGNOSIS — L989 Disorder of the skin and subcutaneous tissue, unspecified: Secondary | ICD-10-CM | POA: Diagnosis not present

## 2013-11-12 DIAGNOSIS — Z0000 Encounter for general adult medical examination without abnormal findings: Secondary | ICD-10-CM | POA: Diagnosis not present

## 2013-11-12 DIAGNOSIS — L603 Nail dystrophy: Secondary | ICD-10-CM

## 2013-11-12 NOTE — Progress Notes (Signed)
Pre visit review using our clinic review tool, if applicable. No additional management support is needed unless otherwise documented below in the visit note. 

## 2013-11-12 NOTE — Assessment & Plan Note (Signed)
Under nose on L  ? Basal cell lesion vs seb gland hyperplasia Ref to derm

## 2013-11-12 NOTE — Assessment & Plan Note (Signed)
Disc goals for lipids and reasons to control them Rev labs with pt Rev low sat fat diet in detail Continue statin and diet  

## 2013-11-12 NOTE — Assessment & Plan Note (Signed)
?   Onychomycosis  Ref to derm She is interested in [place] -new topical -- ? Only 15% success rate acc to my sources

## 2013-11-12 NOTE — Progress Notes (Signed)
Subjective:    Patient ID: Donna Murphy, female    DOB: 11-24-1945, 68 y.o.   MRN: [id number]  HPI I have personally reviewed the Medicare Annual Wellness questionnaire and have noted 1. The patient's medical and social history 2. Their use of alcohol, tobacco or illicit drugs 3. Their current medications and supplements 4. The patient's functional ability including ADL's, fall risks, home safety risks and hearing or visual             impairment. 5. Diet and physical activities 6. Evidence for depression or mood disorders  The patients weight, height, BMI have been recorded in the chart and visual acuity is per eye clinic.  I have made referrals, counseling and provided education to the patient based review of the above and I have provided the pt with a written personalized care plan for preventive services.  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  Routine anticipatory guidance given to patient.  See health maintenance. Colon cancer screening 9/07 normal Breast cancer screening 5/14 - thinks 5/15 - will check on that  Self breast exam -normal  Flu vaccine 10/14 -will get one in the fall  Tetanus vaccine 8/07  Pneumovax 8/11 - will be due for ptx 23 next year and then prevnar one year later  Zoster vaccine 12/14   Advance directive - does not have one - given packet  Cognitive function addressed- see scanned forms- and if abnormal then additional documentation follows. - no worries about memory   PMH and SH reviewed  Meds, vitals, and allergies reviewed.   ROS: See HPI.  Otherwise negative.    Sees Dr Chalmers Cater for DM Last there in Dec and has f/u next week  Got her A1C last -was still above 7 (but overall getting better)- worse with stress   bp is stable today  No cp or palpitations or headaches or edema  No side effects to medicines  BP Readings from Last 3 Encounters:  11/12/13 132/84  07/14/13 110/74  06/24/13  138/84     Hyperlipidemia  Lab Results  Component Value Date   CHOL 153 11/10/2013   CHOL 140 02/08/2011   CHOL 132 12/24/2008   Lab Results  Component Value Date   HDL 47.80 11/10/2013   HDL 56.80 02/08/2011   HDL 45.80 12/24/2008   Lab Results  Component Value Date   LDLCALC 81 11/10/2013   LDLCALC 68 02/08/2011   LDLCALC 68 12/24/2008   Lab Results  Component Value Date   TRIG 120.0 11/10/2013   TRIG 75.0 02/08/2011   TRIG 90.0 12/24/2008   Lab Results  Component Value Date   CHOLHDL 3 11/10/2013   CHOLHDL 2 02/08/2011   CHOLHDL 3 12/24/2008   No results found for this basename: LDLDIRECT   not as much exercise due to the heat , but she does the yard work -more with cooler weather  Needs to use the elliptical she has  LDL is at goal   Has seen orthopedics - for foot issues and knee issues  Thought she had a leg length discrepency  Tried otc orthotics   Has fungal infection - on toenails  She may be interested in [place] -- she may see a dermatologist  Wants skin tags removed and lesion under nose looked at   She changed to stevia from aspertame  [employer] coke however   Patient Active Problem List  Diagnosis Date Noted  . Encounter for Medicare annual wellness exam 11/12/2013  . Skin lesion of face 11/12/2013  . Dystrophic nail 11/12/2013  . Routine general medical examination at a health care facility 05/24/2011  . Other screening mammogram 05/24/2011  . Gynecological examination 05/24/2011  . DM, UNCOMPLICATED, TYPE II 96/29/5284  . HYPERLIPIDEMIA 07/12/2006  . HYPERTENSION 07/12/2006  . VARICOSE VEIN 07/12/2006  . ALLERGIC RHINITIS 07/12/2006  . GERD 07/12/2006  . IRRITABLE BOWEL SYNDROME 07/12/2006   Past Medical History  Diagnosis Date  . Allergic rhinitis   . GERD (gastroesophageal reflux disease)   . HLD (hyperlipidemia)   . HTN (hypertension)   . DMII (diabetes mellitus, type 2)   . Irritable bowel syndrome   . Varices of other sites   . Fibroids      uterine   Past Surgical History  Procedure Laterality Date  . Tonsillectomy    . Endometrial biopsy  10/01    negative  . Colonoscopy  9/07    negative   History  Substance Use Topics  . Smoking status: Never Smoker   . Smokeless tobacco: Never Used  . Alcohol Use: Yes     Comment: wine every other day   Family History  Problem Relation Age of Onset  . Coronary artery disease Mother   . Hyperlipidemia Mother   . Hypertension Mother   . Osteoporosis Mother   . Heart attack Mother     CABG in her 54's  . Diabetes Father   . Heart failure Father   . Hypertension Father   . Transient ischemic attack Father   . Diabetes      GF  . Hypertension Brother   . Heart murmur Son   . Brain cancer Sister    Allergies  Allergen Reactions  . Ciprofloxacin     Arm numbness  . Sulfonamide Derivatives     Hives, rash   Current Outpatient Prescriptions on File Prior to Visit  Medication Sig Dispense Refill  . aspirin 81 MG tablet Take 81 mg by mouth daily.        [name] atorvastatin (LIPITOR) 40 MG tablet Take 1 tablet (40 mg total) by mouth daily.  30 tablet  11  . cromolyn (NASALCROM) 5.2 MG/ACT nasal spray Place 2 sprays into the nose daily as needed.  13 mL  11  . glimepiride (AMARYL) 4 MG tablet Take 4 mg by mouth 2 (two) times daily with a meal.      . glucose blood (ONE TOUCH ULTRA TEST) test strip Check blood sugar once daily and as needed for DM 250.00  50 each  1  . losartan (COZAAR) 50 MG tablet Take 1 tablet (50 mg total) by mouth daily.  30 tablet  11  . Multiple Vitamin (MULTIVITAMIN) tablet Take 1 tablet by mouth daily.        [name] omeprazole (PRILOSEC) 20 MG capsule Take 1 capsule (20 mg total) by mouth daily.  30 capsule  11  . sitaGLIPtan-metformin (JANUMET) 50-1000 MG per tablet Take 1 tablet by mouth 2 (two) times daily with a meal.        . triamcinolone (KENALOG) 0.025 % cream Apply topically daily as needed. Apply to affected areas once daily as needed  30 g  1    No current facility-administered medications on file prior to visit.    Review of Systems Review of Systems  Constitutional: Negative for fever, appetite change, fatigue and unexpected weight change.  Eyes: Negative  for pain and visual disturbance.  Respiratory: Negative for cough and shortness of breath.   Cardiovascular: Negative for cp or palpitations    Gastrointestinal: Negative for nausea, diarrhea and constipation.  Genitourinary: Negative for urgency and frequency.  Skin: Negative for pallor or rash  pos for discolored toenails, skin tags, and skin lesion on face  MSK pos for joint/leg pain  Neurological: Negative for weakness, light-headedness, numbness and headaches.  Hematological: Negative for adenopathy. Does not bruise/bleed easily.  Psychiatric/Behavioral: Negative for dysphoric mood. The patient is not nervous/anxious.         Objective:   Physical Exam  Constitutional: She appears well-developed and well-nourished. No distress.  HENT:  Head: Normocephalic and atraumatic.  Right Ear: External ear normal.  Left Ear: External ear normal.  Nose: Nose normal.  Mouth/Throat: Oropharynx is clear and moist.  Eyes: Conjunctivae and EOM are normal. Pupils are equal, round, and reactive to light. Right eye exhibits no discharge. Left eye exhibits no discharge. No scleral icterus.  Neck: Normal range of motion. Neck supple. No JVD present. Carotid bruit is not present. No thyromegaly present.  Cardiovascular: Normal rate, regular rhythm, normal heart sounds and intact distal pulses.  Exam reveals no gallop.   Some varicosities on legs  Pulmonary/Chest: Effort normal and breath sounds normal. No respiratory distress. She has no wheezes. She has no rales.  Abdominal: Soft. Bowel sounds are normal. She exhibits no distension and no mass. There is no tenderness.  Musculoskeletal: She exhibits no edema and no tenderness.  Lymphadenopathy:    She has no cervical adenopathy.   Neurological: She is alert. She has normal reflexes. No cranial nerve deficit. She exhibits normal muscle tone. Coordination normal.  Skin: Skin is warm and dry. No rash noted. No erythema. No pallor.  Some white discoloration on both great toenails distally-worse on the L  Some thickening   Lesion above L upper lip- is pearly in color 2-3 mm   Psychiatric: She has a normal mood and affect.          Assessment & Plan:   Problem List Items Addressed This Visit     Cardiovascular and Mediastinum   HYPERTENSION - Primary      bp in fair control at this time  BP Readings from Last 1 Encounters:  11/12/13 132/84   No changes needed Disc lifstyle change with low sodium diet and exercise  Rev labs from last visit with DR Chalmers Cater also       Musculoskeletal and Integument   Skin lesion of face     Under nose on L  ? Basal cell lesion vs seb gland hyperplasia Ref to derm     Relevant Orders      Ambulatory referral to Dermatology   Dystrophic nail     ? Onychomycosis  Ref to derm She is interested in [place] -new topical -- ? Only 15% success rate acc to my sources     Relevant Orders      Ambulatory referral to Dermatology     Other   HYPERLIPIDEMIA     Disc goals for lipids and reasons to control them Rev labs with pt Rev low sat fat diet in detail Continue statin and diet     Encounter for Medicare annual wellness exam     Reviewed health habits including diet and exercise and skin cancer prevention Reviewed appropriate screening tests for age  Also reviewed health mt list, fam hx and immunization status , as well  as social and family history   See HPI Lab Personnel officer on Adv dir given Due for pneumovax 23 in 1 year and then prevnar in 2 years

## 2013-11-12 NOTE — Patient Instructions (Signed)
Stop up front to send for last mammogram - ? Did she have one 5/15 at [facility]? Please work on an advanced directive  Stop at check out for referral to dermatology See Dr Chalmers Cater as planned

## 2013-11-12 NOTE — Assessment & Plan Note (Signed)
bp in fair control at this time  BP Readings from Last 1 Encounters:  11/12/13 132/84   No changes needed Disc lifstyle change with low sodium diet and exercise  Rev labs from last visit with DR Chalmers Cater also

## 2013-11-12 NOTE — Assessment & Plan Note (Signed)
Reviewed health habits including diet and exercise and skin cancer prevention Reviewed appropriate screening tests for age  Also reviewed health mt list, fam hx and immunization status , as well as social and family history   See HPI Lab rev Packet on Adv dir given Due for pneumovax 23 in 1 year and then prevnar in 2 years

## 2013-11-18 DIAGNOSIS — E7800 Pure hypercholesterolemia, unspecified: Secondary | ICD-10-CM | POA: Diagnosis not present

## 2013-11-18 DIAGNOSIS — IMO0001 Reserved for inherently not codable concepts without codable children: Secondary | ICD-10-CM | POA: Diagnosis not present

## 2013-11-18 DIAGNOSIS — I10 Essential (primary) hypertension: Secondary | ICD-10-CM | POA: Diagnosis not present

## 2013-12-02 DIAGNOSIS — Z23 Encounter for immunization: Secondary | ICD-10-CM | POA: Diagnosis not present

## 2013-12-15 ENCOUNTER — Ambulatory Visit: Payer: Self-pay | Admitting: Family Medicine

## 2013-12-15 ENCOUNTER — Encounter: Payer: Self-pay | Admitting: Family Medicine

## 2013-12-15 DIAGNOSIS — Z1231 Encounter for screening mammogram for malignant neoplasm of breast: Secondary | ICD-10-CM | POA: Diagnosis not present

## 2013-12-16 ENCOUNTER — Encounter: Payer: Self-pay | Admitting: *Deleted

## 2013-12-22 DIAGNOSIS — C4401 Basal cell carcinoma of skin of lip: Secondary | ICD-10-CM | POA: Diagnosis not present

## 2013-12-22 DIAGNOSIS — D230 Other benign neoplasm of skin of lip: Secondary | ICD-10-CM | POA: Diagnosis not present

## 2013-12-22 DIAGNOSIS — L820 Inflamed seborrheic keratosis: Secondary | ICD-10-CM | POA: Diagnosis not present

## 2013-12-22 DIAGNOSIS — D485 Neoplasm of uncertain behavior of skin: Secondary | ICD-10-CM | POA: Diagnosis not present

## 2014-02-04 DIAGNOSIS — Z85828 Personal history of other malignant neoplasm of skin: Secondary | ICD-10-CM | POA: Diagnosis not present

## 2014-02-04 DIAGNOSIS — B351 Tinea unguium: Secondary | ICD-10-CM | POA: Diagnosis not present

## 2014-02-04 DIAGNOSIS — Z08 Encounter for follow-up examination after completed treatment for malignant neoplasm: Secondary | ICD-10-CM | POA: Diagnosis not present

## 2014-06-30 ENCOUNTER — Other Ambulatory Visit: Payer: Self-pay | Admitting: Family Medicine

## 2014-08-25 ENCOUNTER — Encounter: Payer: Self-pay | Admitting: Family Medicine

## 2014-08-25 ENCOUNTER — Ambulatory Visit (INDEPENDENT_AMBULATORY_CARE_PROVIDER_SITE_OTHER): Payer: Medicare Other | Admitting: Family Medicine

## 2014-08-25 VITALS — BP 130/80 | HR 90 | Temp 98.9°F | Ht 63.75 in | Wt 145.5 lb

## 2014-08-25 DIAGNOSIS — J0100 Acute maxillary sinusitis, unspecified: Secondary | ICD-10-CM

## 2014-08-25 DIAGNOSIS — J019 Acute sinusitis, unspecified: Secondary | ICD-10-CM | POA: Insufficient documentation

## 2014-08-25 MED ORDER — AMOXICILLIN-POT CLAVULANATE 875-125 MG PO TABS: 1.0000 | ORAL_TABLET | Freq: Two times a day (BID) | ORAL | Status: DC

## 2014-08-25 NOTE — Patient Instructions (Addendum)
Take augmentin as directed for sinusitis Continue current symptomatic medicines  Drink lots of fluids  Try to rest also   Blood pressure is better on 2nd check today

## 2014-08-25 NOTE — Progress Notes (Signed)
Pre visit review using our clinic review tool, if applicable. No additional management support is needed unless otherwise documented below in the visit note. 

## 2014-08-25 NOTE — Assessment & Plan Note (Signed)
Acute sinusitis - 10 d s/p uri  Cover with augmentin  Fluids/rest /sympt care   Caution re: exp to immunocop husband if not improving  Update if not starting to improve in a week or if worsening

## 2014-08-25 NOTE — Progress Notes (Signed)
Subjective:    Patient ID: Donna Murphy, female    DOB: [birth year], 69 y.o.   MRN: [id number]  HPI Here with uri symptoms   Started getting sick about 10 days ago - with a ST and drainage  Low grade temp for first few days  Eyes and ears itched  Some yellow nasal discharge intermittent with clear  Sinus headaches over her eyes-both sides  achey and feeling worse instead of better  Not a lot of cough - not productive   Has used otc cough drops and throat spray  Allergy medicines and a netti pot  Also gargle with salt water and peroxide   ST has improved overall   Patient Active Problem List   Diagnosis Date Noted  . Encounter for Medicare annual wellness exam 11/12/2013  . Skin lesion of face 11/12/2013  . Dystrophic nail 11/12/2013  . Routine general medical examination at a health care facility 05/24/2011  . Other screening mammogram 05/24/2011  . Gynecological examination 05/24/2011  . DM, UNCOMPLICATED, TYPE II 41/74/0814  . HYPERLIPIDEMIA 07/12/2006  . HYPERTENSION 07/12/2006  . VARICOSE VEIN 07/12/2006  . ALLERGIC RHINITIS 07/12/2006  . GERD 07/12/2006  . IRRITABLE BOWEL SYNDROME 07/12/2006   Past Medical History  Diagnosis Date  . Allergic rhinitis   . GERD (gastroesophageal reflux disease)   . HLD (hyperlipidemia)   . HTN (hypertension)   . DMII (diabetes mellitus, type 2)   . Irritable bowel syndrome   . Varices of other sites   . Fibroids     uterine   Past Surgical History  Procedure Laterality Date  . Tonsillectomy    . Endometrial biopsy  10/01    negative  . Colonoscopy  9/07    negative   History  Substance Use Topics  . Smoking status: Never Smoker   . Smokeless tobacco: Never Used  . Alcohol Use: 0.0 oz/week    0 Standard drinks or equivalent per week     Comment: wine every other day   Family History  Problem Relation Age of Onset  . Coronary artery disease Mother   . Hyperlipidemia Mother   . Hypertension Mother   .  Osteoporosis Mother   . Heart attack Mother     CABG in her 30's  . Diabetes Father   . Heart failure Father   . Hypertension Father   . Transient ischemic attack Father   . Diabetes      GF  . Hypertension Brother   . Heart murmur Son   . Brain cancer Sister    Allergies  Allergen Reactions  . Ciprofloxacin     Arm numbness  . Sulfonamide Derivatives     Hives, rash   Current Outpatient Prescriptions on File Prior to Visit  Medication Sig Dispense Refill  . aspirin 81 MG tablet Take 81 mg by mouth daily.      [name] atorvastatin (LIPITOR) 40 MG tablet TAKE ONE BY MOUTH A DAY 90 tablet 3  . Cholecalciferol (VITAMIN D) 2000 UNITS tablet Take 2,000 Units by mouth daily.    [name] glimepiride (AMARYL) 4 MG tablet Take 4 mg by mouth 2 (two) times daily with a meal.    . glucose blood (ONE TOUCH ULTRA TEST) test strip Check blood sugar once daily and as needed for DM 250.00 50 each 1  . Krill Oil 300 MG CAPS Take 1 capsule by mouth daily.    [name] losartan (COZAAR) 50 MG tablet TAKE 1 TABLET BY  MOUTH EVERY DAY 90 tablet 3  . Multiple Vitamin (MULTIVITAMIN) tablet Take 1 tablet by mouth daily.      [name] omeprazole (PRILOSEC) 20 MG capsule Take 1 capsule (20 mg total) by mouth daily. 30 capsule 11  . sitaGLIPtan-metformin (JANUMET) 50-1000 MG per tablet Take 1 tablet by mouth 2 (two) times daily with a meal.      . triamcinolone (KENALOG) 0.025 % cream Apply topically daily as needed. Apply to affected areas once daily as needed 30 g 1   No current facility-administered medications on file prior to visit.      Review of Systems Review of Systems  Constitutional: Negative for fever, appetite change, and unexpected weight change.  ENT pos for congestion and rhinorrhea and sinus pressure  Eyes: Negative for pain and visual disturbance.  Respiratory: Negative for wheeze  and shortness of breath.   Cardiovascular: Negative for cp or palpitations    Gastrointestinal: Negative for nausea, diarrhea and  constipation.  Genitourinary: Negative for urgency and frequency.  Skin: Negative for pallor or rash   Neurological: Negative for weakness, light-headedness, numbness and headaches.  Hematological: Negative for adenopathy. Does not bruise/bleed easily.  Psychiatric/Behavioral: Negative for dysphoric mood. The patient is not nervous/anxious.         Objective:   Physical Exam  Constitutional: She appears well-developed and well-nourished. No distress.  HENT:  Head: Normocephalic and atraumatic.  Right Ear: External ear normal.  Left Ear: External ear normal.  Mouth/Throat: Oropharynx is clear and moist. No oropharyngeal exudate.  Nares are injected and congested  Bilateral maxillary sinus tenderness  Post nasal drip   Eyes: Conjunctivae and EOM are normal. Pupils are equal, round, and reactive to light. Right eye exhibits no discharge. Left eye exhibits no discharge.  Neck: Normal range of motion. Neck supple.  Cardiovascular: Normal rate and regular rhythm.   Pulmonary/Chest: Effort normal and breath sounds normal. No respiratory distress. She has no wheezes. She has no rales.  Lymphadenopathy:    She has no cervical adenopathy.  Neurological: She is alert. No cranial nerve deficit.  Skin: Skin is warm and dry. No rash noted.  Psychiatric: She has a normal mood and affect.          Assessment & Plan:   Problem List Items Addressed This Visit    Acute sinusitis - Primary    Acute sinusitis - 10 d s/p uri  Cover with augmentin  Fluids/rest /sympt care   Caution re: exp to immunocop husband if not improving  Update if not starting to improve in a week or if worsening         Relevant Medications   loratadine (CLARITIN) 10 MG tablet   Triamcinolone Acetonide (NASACORT ALLERGY 24HR NA)   amoxicillin-clavulanate (AUGMENTIN) 875-125 MG per tablet

## 2014-11-04 ENCOUNTER — Ambulatory Visit (INDEPENDENT_AMBULATORY_CARE_PROVIDER_SITE_OTHER): Payer: Medicare Other | Admitting: Primary Care

## 2014-11-04 ENCOUNTER — Encounter: Payer: Self-pay | Admitting: Primary Care

## 2014-11-04 VITALS — BP 128/68 | HR 86 | Temp 98.0°F | Ht 63.75 in | Wt 149.8 lb

## 2014-11-04 DIAGNOSIS — R509 Fever, unspecified: Secondary | ICD-10-CM | POA: Diagnosis not present

## 2014-11-04 DIAGNOSIS — R350 Frequency of micturition: Secondary | ICD-10-CM

## 2014-11-04 LAB — POCT URINALYSIS DIPSTICK
BILIRUBIN UA: NEGATIVE
Blood, UA: NEGATIVE
Glucose, UA: NEGATIVE
Ketones, UA: NEGATIVE
LEUKOCYTES UA: NEGATIVE
NITRITE UA: NEGATIVE
PROTEIN UA: NEGATIVE
UROBILINOGEN UA: NEGATIVE
pH, UA: 6

## 2014-11-04 NOTE — Addendum Note (Signed)
Addended by: Jacqualin Combes on: 11/04/2014 03:15 PM   Modules accepted: Orders

## 2014-11-04 NOTE — Progress Notes (Signed)
Pre visit review using our clinic review tool, if applicable. No additional management support is needed unless otherwise documented below in the visit note. 

## 2014-11-04 NOTE — Patient Instructions (Signed)
You may take benadryl 25 mg or 50 mg every 4 to 8 hours as needed. Do not exceed 300 mg in one day.  I will send off your specimen for testing.   Please follow up with Dr. Glori Bickers as scheduled.  It was a pleasure meeting you!

## 2014-11-04 NOTE — Progress Notes (Signed)
Subjective:    Patient ID: Donna Murphy, female    DOB: 13-Aug-1945, 69 y.o.   MRN: [id number]  HPI  Donna Murphy is a 69 year old female who presents today with a chief complaint of urinary frequency. Her symptoms began 1 month ago with urinary frequency and dysuria. She's recently been caring for her ill husband who was diagnosed with a UTI and prescribed Bactrim. She took 2 of the bactrim pills which relieved her symptoms and did not cause side effects (as she is allergic). Last night she started experiencing flank pain so she took another Bactrim and woke up today with hives.  Currently she reports fever (low grade of 99.0 this morning), flank pain, body aches, and the urinary frequency. She took some ibuprofen early this morning which helped her get back to sleep. She's been increasing her water consumption and has not taken anything OTC for symptoms.  Review of Systems  Constitutional: Positive for fever and chills.  Respiratory: Negative for shortness of breath.   Cardiovascular: Negative for chest pain.  Genitourinary: Positive for frequency. Negative for dysuria, urgency, vaginal discharge, difficulty urinating and vaginal pain.  Musculoskeletal: Positive for myalgias.       Past Medical History  Diagnosis Date  . Allergic rhinitis   . GERD (gastroesophageal reflux disease)   . HLD (hyperlipidemia)   . HTN (hypertension)   . DMII (diabetes mellitus, type 2)   . Irritable bowel syndrome   . Varices of other sites   . Fibroids     uterine    History   Social History  . Marital Status: Married    Spouse Name: N/A  . Number of Children: N/A  . Years of Education: N/A   Occupational History  . Not on file.   Social History Main Topics  . Smoking status: Never Smoker   . Smokeless tobacco: Never Used  . Alcohol Use: 0.0 oz/week    0 Standard drinks or equivalent per week     Comment: wine every other day  . Drug Use: No  . Sexual Activity: Not on file   Other  Topics Concern  . Not on file   Social History Narrative   Walks for exercise; does yardwork too          Past Surgical History  Procedure Laterality Date  . Tonsillectomy    . Endometrial biopsy  10/01    negative  . Colonoscopy  9/07    negative    Family History  Problem Relation Age of Onset  . Coronary artery disease Mother   . Hyperlipidemia Mother   . Hypertension Mother   . Osteoporosis Mother   . Heart attack Mother     CABG in her 33's  . Diabetes Father   . Heart failure Father   . Hypertension Father   . Transient ischemic attack Father   . Diabetes      GF  . Hypertension Brother   . Heart murmur Son   . Brain cancer Sister     Allergies  Allergen Reactions  . Ciprofloxacin     Arm numbness  . Sulfonamide Derivatives     Hives, rash    Current Outpatient Prescriptions on File Prior to Visit  Medication Sig Dispense Refill  . aspirin 81 MG tablet Take 81 mg by mouth daily.      [name] atorvastatin (LIPITOR) 40 MG tablet TAKE ONE BY MOUTH A DAY 90 tablet 3  . Cholecalciferol (VITAMIN D)  2000 UNITS tablet Take 2,000 Units by mouth daily.    [name] glimepiride (AMARYL) 4 MG tablet Take 4 mg by mouth 2 (two) times daily with a meal.    . glucose blood (ONE TOUCH ULTRA TEST) test strip Check blood sugar once daily and as needed for DM 250.00 50 each 1  . Krill Oil 300 MG CAPS Take 1 capsule by mouth daily.    [name] loratadine (CLARITIN) 10 MG tablet Take 10 mg by mouth daily.    [name] losartan (COZAAR) 50 MG tablet TAKE 1 TABLET BY MOUTH EVERY DAY 90 tablet 3  . Multiple Vitamin (MULTIVITAMIN) tablet Take 1 tablet by mouth daily.      [name] omeprazole (PRILOSEC) 20 MG capsule Take 1 capsule (20 mg total) by mouth daily. 30 capsule 11  . sitaGLIPtan-metformin (JANUMET) 50-1000 MG per tablet Take 1 tablet by mouth 2 (two) times daily with a meal.      . triamcinolone (KENALOG) 0.025 % cream Apply topically daily as needed. Apply to affected areas once daily as needed 30 g 1    . Triamcinolone Acetonide (NASACORT ALLERGY 24HR NA) Place 1 spray into the nose daily.     No current facility-administered medications on file prior to visit.    BP 128/68 mmHg  Pulse 86  Temp(Src) 98 F (36.7 C) (Oral)  Ht 5' 3.75" (1.619 m)  Wt 149 lb 12.8 oz (67.949 kg)  BMI 25.92 kg/m2  SpO2 98%    Objective:   Physical Exam  Constitutional: She appears well-nourished. She does not appear ill.  Cardiovascular: Normal rate and regular rhythm.   Pulmonary/Chest: Effort normal and breath sounds normal.  Abdominal: Soft. Bowel sounds are normal. There is no tenderness.  Genitourinary: No erythema in the vagina. No vaginal discharge found.  Pelvic exam preformed, unremarkable exam.  Skin: Skin is warm and dry.          Assessment & Plan:  Urinary frequency:  Present intermittently for 1 month. Symptoms today with low grade fever, flank pain, frequency. UA: Negative for everything. Exam unremarkable, no CVA tenderness. Vitals stable. Pelvic exam preformed, noted possible cystocele, notified patient who is to see her PCP in several weeks. Wet prep completed and sent off. Push fluids, notify us if fevers return, or if she experiences worsening pain, nausea, vomiting.

## 2014-11-05 LAB — WET PREP BY MOLECULAR PROBE
Candida species: NEGATIVE
Gardnerella vaginalis: NEGATIVE
TRICHOMONAS VAG: NEGATIVE

## 2014-11-11 DIAGNOSIS — I10 Essential (primary) hypertension: Secondary | ICD-10-CM | POA: Diagnosis not present

## 2014-11-11 DIAGNOSIS — E780 Pure hypercholesterolemia: Secondary | ICD-10-CM | POA: Diagnosis not present

## 2014-11-11 DIAGNOSIS — E1165 Type 2 diabetes mellitus with hyperglycemia: Secondary | ICD-10-CM | POA: Diagnosis not present

## 2014-11-22 ENCOUNTER — Telehealth: Payer: Self-pay | Admitting: Family Medicine

## 2014-11-22 DIAGNOSIS — E785 Hyperlipidemia, unspecified: Secondary | ICD-10-CM

## 2014-11-22 DIAGNOSIS — I10 Essential (primary) hypertension: Secondary | ICD-10-CM

## 2014-11-22 NOTE — Telephone Encounter (Signed)
-----   Message from Marchia Bond sent at 11/12/2014  2:54 PM EDT ----- Regarding: cpx labs 8/22, need orders please :-) Please order  future cpx labs for pt's upcoming lab appt. Pt also wants a1c done. Thanks Aniceto Boss

## 2014-11-23 ENCOUNTER — Other Ambulatory Visit (INDEPENDENT_AMBULATORY_CARE_PROVIDER_SITE_OTHER): Payer: Medicare Other

## 2014-11-23 DIAGNOSIS — I10 Essential (primary) hypertension: Secondary | ICD-10-CM | POA: Diagnosis not present

## 2014-11-23 DIAGNOSIS — E785 Hyperlipidemia, unspecified: Secondary | ICD-10-CM | POA: Diagnosis not present

## 2014-11-23 LAB — CBC WITH DIFFERENTIAL/PLATELET
BASOS PCT: 0.4 % (ref 0.0–3.0)
Basophils Absolute: 0 10*3/uL (ref 0.0–0.1)
EOS ABS: 0.2 10*3/uL (ref 0.0–0.7)
EOS PCT: 2 % (ref 0.0–5.0)
HEMATOCRIT: 39.8 % (ref 36.0–46.0)
HEMOGLOBIN: 13.2 g/dL (ref 12.0–15.0)
LYMPHS PCT: 31.9 % (ref 12.0–46.0)
Lymphs Abs: 2.5 10*3/uL (ref 0.7–4.0)
MCHC: 33.1 g/dL (ref 30.0–36.0)
MCV: 87.5 fl (ref 78.0–100.0)
MONOS PCT: 6.1 % (ref 3.0–12.0)
Monocytes Absolute: 0.5 10*3/uL (ref 0.1–1.0)
NEUTROS ABS: 4.7 10*3/uL (ref 1.4–7.7)
Neutrophils Relative %: 59.6 % (ref 43.0–77.0)
PLATELETS: 325 10*3/uL (ref 150.0–400.0)
RBC: 4.55 Mil/uL (ref 3.87–5.11)
RDW: 13.5 % (ref 11.5–15.5)
WBC: 7.9 10*3/uL (ref 4.0–10.5)

## 2014-11-23 LAB — COMPREHENSIVE METABOLIC PANEL
ALBUMIN: 4.1 g/dL (ref 3.5–5.2)
ALT: 22 U/L (ref 0–35)
AST: 16 U/L (ref 0–37)
Alkaline Phosphatase: 66 U/L (ref 39–117)
BUN: 19 mg/dL (ref 6–23)
CALCIUM: 9.3 mg/dL (ref 8.4–10.5)
CHLORIDE: 104 meq/L (ref 96–112)
CO2: 25 mEq/L (ref 19–32)
CREATININE: 0.69 mg/dL (ref 0.40–1.20)
GFR: 89.93 mL/min (ref >60.00)
Glucose, Bld: 171 mg/dL — ABNORMAL HIGH (ref 70–99)
Potassium: 4 mEq/L (ref 3.5–5.1)
Sodium: 139 mEq/L (ref 135–145)
Total Bilirubin: 0.6 mg/dL (ref 0.2–1.2)
Total Protein: 7 g/dL (ref 6.0–8.3)

## 2014-11-23 LAB — LIPID PANEL
CHOLESTEROL: 154 mg/dL (ref 0–200)
HDL: 51.7 mg/dL (ref >39.00)
LDL CALC: 77 mg/dL (ref 0–99)
NonHDL: 102.51
TRIGLYCERIDES: 127 mg/dL (ref 0.0–149.0)
Total CHOL/HDL Ratio: 3
VLDL: 25.4 mg/dL (ref 0.0–40.0)

## 2014-11-23 LAB — TSH: TSH: 1.46 u[IU]/mL (ref 0.35–4.50)

## 2014-11-25 ENCOUNTER — Other Ambulatory Visit (INDEPENDENT_AMBULATORY_CARE_PROVIDER_SITE_OTHER): Payer: Medicare Other

## 2014-11-25 DIAGNOSIS — E119 Type 2 diabetes mellitus without complications: Secondary | ICD-10-CM | POA: Diagnosis not present

## 2014-11-25 LAB — HEMOGLOBIN A1C: HEMOGLOBIN A1C: 7.6 % — AB (ref 4.6–6.5)

## 2014-11-27 ENCOUNTER — Encounter: Payer: Self-pay | Admitting: Family Medicine

## 2014-11-27 ENCOUNTER — Ambulatory Visit (INDEPENDENT_AMBULATORY_CARE_PROVIDER_SITE_OTHER): Payer: Medicare Other | Admitting: Family Medicine

## 2014-11-27 VITALS — BP 132/70 | HR 85 | Temp 98.8°F | Ht 63.75 in | Wt 144.0 lb

## 2014-11-27 DIAGNOSIS — Z23 Encounter for immunization: Secondary | ICD-10-CM | POA: Diagnosis not present

## 2014-11-27 DIAGNOSIS — E785 Hyperlipidemia, unspecified: Secondary | ICD-10-CM

## 2014-11-27 DIAGNOSIS — Z0000 Encounter for general adult medical examination without abnormal findings: Secondary | ICD-10-CM

## 2014-11-27 DIAGNOSIS — I10 Essential (primary) hypertension: Secondary | ICD-10-CM

## 2014-11-27 NOTE — Progress Notes (Signed)
Pre visit review using our clinic review tool, if applicable. No additional management support is needed unless otherwise documented below in the visit note. 

## 2014-11-27 NOTE — Progress Notes (Signed)
Subjective:    Patient ID: [name], female    DOB: 03/16/1946, 69 y.o.   MRN: [id number]  HPI Here for annual medicare wellness visit as well as chronic/acute medical problems as well as annual preventative exam  I have personally reviewed the Medicare Annual Wellness questionnaire and have noted 1. The patient's medical and social history 2. Their use of alcohol, tobacco or illicit drugs 3. Their current medications and supplements 4. The patient's functional ability including ADL's, fall risks, home safety risks and hearing or visual             impairment. 5. Diet and physical activities 6. Evidence for depression or mood disorders  The patients weight, height, BMI have been recorded in the chart and visual acuity is per eye clinic.  I have made referrals, counseling and provided education to the patient based review of the above and I have provided the pt with a written personalized care plan for preventive services. Reviewed and updated provider list, see scanned forms.  Husband had a stem cell transplant at [place]  Did well with the transplant  Supposed to go to the beach on Monday   See scanned forms.  Routine anticipatory guidance given to patient.  See health maintenance. Colon cancer screening nl 2007 - 10 year recall  Breast cancer screening - mammogram 9/15 nl Self breast exam -no lumps or changes - will schedule her mammogram  Flu vaccine 10/15 - plans to get one next month  Tetanus vaccine 9/07  Pneumovax 8/11, due for prevnar  Zoster vaccine 12/14  dexa -has not had a bone density -wants to wait until her next annual exam , does not take ca but does eat high ca foods,  Does take vit D ever day and she walks and does a lot of yard work , no falls or fractures  Advance directive- has a living will and POA  Cognitive function addressed- see scanned forms- and if abnormal then additional documentation follows.  Memory is ok / has been overwhelmed lately  with husband's health - but not anything unexpected   PMH and SH reviewed  Meds, vitals, and allergies reviewed.   ROS: See HPI.  Otherwise negative.    Wt is down 5 lb with bmi of 24  bp is up on first check  today -- will re check  No cp or palpitations or headaches or edema  No side effects to medicines  BP Readings from Last 3 Encounters:  11/27/14 150/82  11/04/14 128/68  08/25/14 130/80    Better on re check BP: 132/70 mmHg   DM-sees Dr Chalmers Cater Lab Results  Component Value Date   HGBA1C 7.6* 11/25/2014  a little high - she thinks due to the stress  Still walking 2-3 miles per day  [facility] to [facility] [employer] until her husband is released to go     Cholesterol Lab Results  Component Value Date   CHOL 154 11/23/2014   CHOL 153 11/10/2013   CHOL 140 02/08/2011   Lab Results  Component Value Date   HDL 51.70 11/23/2014   HDL 47.80 11/10/2013   HDL 56.80 02/08/2011   Lab Results  Component Value Date   LDLCALC 77 11/23/2014   LDLCALC 81 11/10/2013   LDLCALC 68 02/08/2011   Lab Results  Component Value Date   TRIG 127.0 11/23/2014   TRIG 120.0 11/10/2013   TRIG 75.0 02/08/2011   Lab Results  Component Value Date  CHOLHDL 3 11/23/2014   CHOLHDL 3 11/10/2013   CHOLHDL 2 02/08/2011   No results found for: LDLDIRECT   Great cholesterol  lipitor is working - does make her ache a bit - she stretches and it helps  Co enzyme Q 10 does not help  Sticking with it     Chemistry      Component Value Date/Time   NA 139 11/23/2014 0827   K 4.0 11/23/2014 0827   CL 104 11/23/2014 0827   CO2 25 11/23/2014 0827   BUN 19 11/23/2014 0827   CREATININE 0.69 11/23/2014 0827      Component Value Date/Time   CALCIUM 9.3 11/23/2014 0827   ALKPHOS 66 11/23/2014 0827   AST 16 11/23/2014 0827   ALT 22 11/23/2014 0827   BILITOT 0.6 11/23/2014 0827      Lab Results  Component Value Date   TSH 1.46 11/23/2014    Lab Results  Component Value Date   WBC 7.9 11/23/2014    HGB 13.2 11/23/2014   HCT 39.8 11/23/2014   MCV 87.5 11/23/2014   PLT 325.0 11/23/2014     Patient Active Problem List   Diagnosis Date Noted  . Acute sinusitis 08/25/2014  . Encounter for Medicare annual wellness exam 11/12/2013  . Skin lesion of face 11/12/2013  . Dystrophic nail 11/12/2013  . Routine general medical examination at a health care facility 05/24/2011  . Other screening mammogram 05/24/2011  . Gynecological examination 05/24/2011  . DM, UNCOMPLICATED, TYPE II 89/37/3428  . Hyperlipidemia 07/12/2006  . Essential hypertension 07/12/2006  . VARICOSE VEIN 07/12/2006  . ALLERGIC RHINITIS 07/12/2006  . GERD 07/12/2006  . IRRITABLE BOWEL SYNDROME 07/12/2006   Past Medical History  Diagnosis Date  . Allergic rhinitis   . GERD (gastroesophageal reflux disease)   . HLD (hyperlipidemia)   . HTN (hypertension)   . DMII (diabetes mellitus, type 2)   . Irritable bowel syndrome   . Varices of other sites   . Fibroids     uterine   Past Surgical History  Procedure Laterality Date  . Tonsillectomy    . Endometrial biopsy  10/01    negative  . Colonoscopy  9/07    negative   Social History  Substance Use Topics  . Smoking status: Never Smoker   . Smokeless tobacco: Never Used  . Alcohol Use: 0.0 oz/week    0 Standard drinks or equivalent per week     Comment: wine every other day   Family History  Problem Relation Age of Onset  . Coronary artery disease Mother   . Hyperlipidemia Mother   . Hypertension Mother   . Osteoporosis Mother   . Heart attack Mother     CABG in her 62's  . Diabetes Father   . Heart failure Father   . Hypertension Father   . Transient ischemic attack Father   . Diabetes      GF  . Hypertension Brother   . Heart murmur Son   . Brain cancer Sister    Allergies  Allergen Reactions  . Ciprofloxacin     Arm numbness  . Sulfonamide Derivatives     Hives, rash   Current Outpatient Prescriptions on File Prior to Visit    Medication Sig Dispense Refill  . aspirin 81 MG tablet Take 81 mg by mouth daily.      [name] atorvastatin (LIPITOR) 40 MG tablet TAKE ONE BY MOUTH A DAY 90 tablet 3  . Cholecalciferol (VITAMIN D)  2000 UNITS tablet Take 2,000 Units by mouth daily.    [name] glimepiride (AMARYL) 4 MG tablet Take 4 mg by mouth 2 (two) times daily with a meal.    . glucose blood (ONE TOUCH ULTRA TEST) test strip Check blood sugar once daily and as needed for DM 250.00 50 each 1  . Krill Oil 300 MG CAPS Take 1 capsule by mouth daily.    [name] loratadine (CLARITIN) 10 MG tablet Take 10 mg by mouth daily.    [name] losartan (COZAAR) 50 MG tablet TAKE 1 TABLET BY MOUTH EVERY DAY 90 tablet 3  . Multiple Vitamin (MULTIVITAMIN) tablet Take 1 tablet by mouth daily.      [name] omeprazole (PRILOSEC) 20 MG capsule Take 1 capsule (20 mg total) by mouth daily. 30 capsule 11  . sitaGLIPtan-metformin (JANUMET) 50-1000 MG per tablet Take 1 tablet by mouth 2 (two) times daily with a meal.      . triamcinolone (KENALOG) 0.025 % cream Apply topically daily as needed. Apply to affected areas once daily as needed 30 g 1  . Triamcinolone Acetonide (NASACORT ALLERGY 24HR NA) Place 1 spray into the nose daily.     No current facility-administered medications on file prior to visit.     Review of Systems Review of Systems  Constitutional: Negative for fever, appetite change, fatigue and unexpected weight change.  Eyes: Negative for pain and visual disturbance.  Respiratory: Negative for cough and shortness of breath.   Cardiovascular: Negative for cp or palpitations    Gastrointestinal: Negative for nausea, diarrhea and constipation.  Genitourinary: Negative for urgency and frequency.  Skin: Negative for pallor or rash   Neurological: Negative for weakness, light-headedness, numbness and headaches.  Hematological: Negative for adenopathy. Does not bruise/bleed easily.  Psychiatric/Behavioral: Negative for dysphoric mood. The patient is not  nervous/anxious.  pos for stressors        Objective:   Physical Exam  Constitutional: She appears well-developed and well-nourished. No distress.  HENT:  Head: Normocephalic and atraumatic.  Right Ear: External ear normal.  Left Ear: External ear normal.  Mouth/Throat: Oropharynx is clear and moist.  Eyes: Conjunctivae and EOM are normal. Pupils are equal, round, and reactive to light. No scleral icterus.  Neck: Normal range of motion. Neck supple. No JVD present. Carotid bruit is not present. No thyromegaly present.  Cardiovascular: Normal rate, regular rhythm, normal heart sounds and intact distal pulses.  Exam reveals no gallop.   Pulmonary/Chest: Effort normal and breath sounds normal. No respiratory distress. She has no wheezes. She exhibits no tenderness.  Abdominal: Soft. Bowel sounds are normal. She exhibits no distension, no abdominal bruit and no mass. There is no tenderness.  Genitourinary: No breast swelling, tenderness, discharge or bleeding.  Musculoskeletal: Normal range of motion. She exhibits no edema or tenderness.  Lymphadenopathy:    She has no cervical adenopathy.  Neurological: She is alert. She has normal reflexes. No cranial nerve deficit. She exhibits normal muscle tone. Coordination normal.  Skin: Skin is warm and dry. No rash noted. No erythema. No pallor.  Psychiatric: She has a normal mood and affect.          Assessment & Plan:   Problem List Items Addressed This Visit    Encounter for Medicare annual wellness exam    Reviewed health habits including diet and exercise and skin cancer prevention Reviewed appropriate screening tests for age  Also reviewed health mt list, fam hx and immunization status , as well as  social and family history   See HPI Labs reviewed  BP on re check 132/70 Get a flu shot in the fall  prevnar vaccine today  Get your eye exam when you get a chance  Schedule your mammogram next month       Essential hypertension -  Primary    Better on 2nd check bp in fair control at this time  BP Readings from Last 1 Encounters:  11/27/14 132/70   No changes needed Disc lifstyle change with low sodium diet and exercise   Lab reviewed       Hyperlipidemia    Disc goals for lipids and reasons to control them Rev labs with pt Rev low sat fat diet in detail  Continue statin and diet       Routine general medical examination at a health care facility    Reviewed health habits including diet and exercise and skin cancer prevention Reviewed appropriate screening tests for age  Also reviewed health mt list, fam hx and immunization status , as well as social and family history   See HPI Labs reviewed  BP on re check 132/70 Get a flu shot in the fall  prevnar vaccine today  Get your eye exam when you get a chance  Schedule your mammogram next month        Other Visit Diagnoses    Need for vaccination with 13-polyvalent pneumococcal conjugate vaccine        Relevant Orders    Pneumococcal conjugate vaccine 13-valent (Completed)

## 2014-11-27 NOTE — Patient Instructions (Addendum)
BP on re check 132/70 Get a flu shot in the fall  prevnar vaccine today  Get your eye exam when you get a chance  Schedule your mammogram next month

## 2014-11-29 NOTE — Assessment & Plan Note (Signed)
Reviewed health habits including diet and exercise and skin cancer prevention Reviewed appropriate screening tests for age  Also reviewed health mt list, fam hx and immunization status , as well as social and family history   See HPI Labs reviewed  BP on re check 132/70 Get a flu shot in the fall  prevnar vaccine today  Get your eye exam when you get a chance  Schedule your mammogram next month

## 2014-11-29 NOTE — Assessment & Plan Note (Signed)
Better on 2nd check bp in fair control at this time  BP Readings from Last 1 Encounters:  11/27/14 132/70   No changes needed Disc lifstyle change with low sodium diet and exercise   Lab reviewed

## 2014-11-29 NOTE — Assessment & Plan Note (Signed)
Disc goals for lipids and reasons to control them Rev labs with pt Rev low sat fat diet in detail Continue statin and diet  

## 2015-01-20 ENCOUNTER — Ambulatory Visit (INDEPENDENT_AMBULATORY_CARE_PROVIDER_SITE_OTHER): Payer: Medicare Other

## 2015-01-20 DIAGNOSIS — Z23 Encounter for immunization: Secondary | ICD-10-CM | POA: Diagnosis not present

## 2015-03-16 NOTE — Progress Notes (Signed)
Erroneous encounter

## 2015-06-26 ENCOUNTER — Other Ambulatory Visit: Payer: Self-pay | Admitting: Family Medicine

## 2015-08-11 ENCOUNTER — Telehealth: Payer: Self-pay

## 2015-08-11 MED ORDER — GLUCOSE BLOOD VI STRP: ORAL_STRIP | Status: AC

## 2015-08-11 NOTE — Telephone Encounter (Signed)
Pt request refill onetouch verio test strip to [facility]. Advised pt done per protocol.

## 2015-11-18 LAB — HM DIABETES EYE EXAM

## 2015-11-21 ENCOUNTER — Telehealth: Payer: Self-pay | Admitting: Family Medicine

## 2015-11-21 DIAGNOSIS — I10 Essential (primary) hypertension: Secondary | ICD-10-CM

## 2015-11-21 DIAGNOSIS — Z209 Contact with and (suspected) exposure to unspecified communicable disease: Secondary | ICD-10-CM | POA: Insufficient documentation

## 2015-11-21 DIAGNOSIS — E785 Hyperlipidemia, unspecified: Secondary | ICD-10-CM

## 2015-11-21 NOTE — Telephone Encounter (Signed)
-----   Message from Eustace Pen, LPN sent at [phone] 12:14 PM EDT ----- Regarding: Pls add Hep C to lab orders for 8/21 Thank you :)

## 2015-11-22 ENCOUNTER — Other Ambulatory Visit (INDEPENDENT_AMBULATORY_CARE_PROVIDER_SITE_OTHER): Payer: Medicare Other

## 2015-11-22 ENCOUNTER — Ambulatory Visit (INDEPENDENT_AMBULATORY_CARE_PROVIDER_SITE_OTHER): Payer: Medicare Other

## 2015-11-22 VITALS — BP 110/80 | HR 74 | Temp 98.7°F | Ht 63.75 in | Wt 142.5 lb

## 2015-11-22 DIAGNOSIS — E119 Type 2 diabetes mellitus without complications: Secondary | ICD-10-CM | POA: Diagnosis not present

## 2015-11-22 DIAGNOSIS — Z209 Contact with and (suspected) exposure to unspecified communicable disease: Secondary | ICD-10-CM | POA: Diagnosis not present

## 2015-11-22 DIAGNOSIS — Z0000 Encounter for general adult medical examination without abnormal findings: Secondary | ICD-10-CM

## 2015-11-22 DIAGNOSIS — E785 Hyperlipidemia, unspecified: Secondary | ICD-10-CM

## 2015-11-22 DIAGNOSIS — I10 Essential (primary) hypertension: Secondary | ICD-10-CM

## 2015-11-22 DIAGNOSIS — Z1239 Encounter for other screening for malignant neoplasm of breast: Secondary | ICD-10-CM | POA: Diagnosis not present

## 2015-11-22 DIAGNOSIS — E2839 Other primary ovarian failure: Secondary | ICD-10-CM

## 2015-11-22 LAB — CBC WITH DIFFERENTIAL/PLATELET
BASOS ABS: 0 10*3/uL (ref 0.0–0.1)
BASOS PCT: 0.5 % (ref 0.0–3.0)
EOS ABS: 0.2 10*3/uL (ref 0.0–0.7)
Eosinophils Relative: 2.7 % (ref 0.0–5.0)
HEMATOCRIT: 39.1 % (ref 36.0–46.0)
Hemoglobin: 13.3 g/dL (ref 12.0–15.0)
LYMPHS ABS: 2.6 10*3/uL (ref 0.7–4.0)
LYMPHS PCT: 37.1 % (ref 12.0–46.0)
MCHC: 33.9 g/dL (ref 30.0–36.0)
MCV: 85.9 fl (ref 78.0–100.0)
Monocytes Absolute: 0.4 10*3/uL (ref 0.1–1.0)
Monocytes Relative: 6 % (ref 3.0–12.0)
NEUTROS ABS: 3.8 10*3/uL (ref 1.4–7.7)
NEUTROS PCT: 53.7 % (ref 43.0–77.0)
PLATELETS: 333 10*3/uL (ref 150.0–400.0)
RBC: 4.55 Mil/uL (ref 3.87–5.11)
RDW: 12.9 % (ref 11.5–15.5)
WBC: 7.1 10*3/uL (ref 4.0–10.5)

## 2015-11-22 LAB — LIPID PANEL
CHOL/HDL RATIO: 3
CHOLESTEROL: 160 mg/dL (ref 0–200)
HDL: 54.3 mg/dL (ref >39.00)
LDL CALC: 80 mg/dL (ref 0–99)
NonHDL: 105.5
TRIGLYCERIDES: 130 mg/dL (ref 0.0–149.0)
VLDL: 26 mg/dL (ref 0.0–40.0)

## 2015-11-22 LAB — COMPREHENSIVE METABOLIC PANEL
ALBUMIN: 4.3 g/dL (ref 3.5–5.2)
ALT: 24 U/L (ref 0–35)
AST: 17 U/L (ref 0–37)
Alkaline Phosphatase: 65 U/L (ref 39–117)
BUN: 17 mg/dL (ref 6–23)
CALCIUM: 9 mg/dL (ref 8.4–10.5)
CHLORIDE: 101 meq/L (ref 96–112)
CO2: 26 meq/L (ref 19–32)
CREATININE: 0.71 mg/dL (ref 0.40–1.20)
GFR: 86.76 mL/min (ref >60.00)
Glucose, Bld: 257 mg/dL — ABNORMAL HIGH (ref 70–99)
POTASSIUM: 4.1 meq/L (ref 3.5–5.1)
Sodium: 138 mEq/L (ref 135–145)
Total Bilirubin: 0.5 mg/dL (ref 0.2–1.2)
Total Protein: 6.9 g/dL (ref 6.0–8.3)

## 2015-11-22 LAB — TSH: TSH: 1.28 u[IU]/mL (ref 0.35–4.50)

## 2015-11-22 LAB — HEMOGLOBIN A1C: HEMOGLOBIN A1C: 9.5 % — AB (ref 4.6–6.5)

## 2015-11-22 LAB — HEPATITIS C ANTIBODY: HCV Ab: NEGATIVE

## 2015-11-22 NOTE — Progress Notes (Signed)
PCP notes:   Health maintenance:  Hep C screening - completed A1C - completed Flu vaccine - addressed Tetanus - postponed/insurance Mammogram - ordered Bone density - ordered Eye exam - per pt, completed 11/15/2015  Abnormal screenings:   None  Patient concerns:   None  Nurse concerns:  None  Next PCP appt:   11/23/15 @ 0900  I reviewed health advisor's note, was available for consultation, and agree with documentation and plan.  Loura Pardon MD

## 2015-11-22 NOTE — Patient Instructions (Signed)
Donna Murphy , Thank you for taking time to come for your Medicare Wellness Visit. I appreciate your ongoing commitment to your health goals. Please review the following plan we discussed and let me know if I can assist you in the future.   These are the goals we discussed: Goals    . Increase physical activity          Starting 11/22/2015, I will continue to exercise at least 45 min daily.        This is a list of the screening recommended for you and due dates:  Health Maintenance  Topic Date Due  . Flu Shot  04/02/2016*  . Mammogram  04/02/2016*  . DEXA scan (bone density measurement)  04/02/2016*  . Tetanus Vaccine  11/21/2016*  . Complete foot exam   11/27/2015  . Pneumonia vaccines (2 of 2 - PPSV23) 11/27/2015  . Colon Cancer Screening  12/27/2015  . Hemoglobin A1C  05/24/2016  . Eye exam for diabetics  11/14/2016  . Shingles Vaccine  Completed  .  Hepatitis C: One time screening is recommended by Center for Disease Control  (CDC) for  adults born from [birth year] through 1964.   Completed  *Topic was postponed. The date shown is not the original due date.   Preventive Care for Adults  A healthy lifestyle and preventive care can promote health and wellness. Preventive health guidelines for adults include the following key practices.  . A routine yearly physical is a good way to check with your health care provider about your health and preventive screening. It is a chance to share any concerns and updates on your health and to receive a thorough exam.  . Visit your dentist for a routine exam and preventive care every 6 months. Brush your teeth twice a day and floss once a day. Good oral hygiene prevents tooth decay and gum disease.  . The frequency of eye exams is based on your age, health, family medical history, use  of contact lenses, and other factors. Follow your health care provider's ecommendations for frequency of eye exams.  . Eat a healthy diet. Foods like vegetables,  fruits, whole grains, low-fat dairy products, and lean protein foods contain the nutrients you need without too many calories. Decrease your intake of foods high in solid fats, added sugars, and salt. Eat the right amount of calories for you. Get information about a proper diet from your health care provider, if necessary.  . Regular physical exercise is one of the most important things you can do for your health. Most adults should get at least 150 minutes of moderate-intensity exercise (any activity that increases your heart rate and causes you to sweat) each week. In addition, most adults need muscle-strengthening exercises on 2 or more days a week.  Silver Sneakers may be a benefit available to you. To determine eligibility, you may visit the website: www.silversneakers.com or contact program at [phone]) [phone] Mon-Fri between 8AM-8PM.   . Maintain a healthy weight. The body mass index (BMI) is a screening tool to identify possible weight problems. It provides an estimate of body fat based on height and weight. Your health care provider can find your BMI and can help you achieve or maintain a healthy weight.   For adults 20 years and older: ? A BMI below 18.5 is considered underweight. ? A BMI of 18.5 to 24.9 is normal. ? A BMI of 25 to 29.9 is considered overweight. ? A BMI of 30 and above is  considered obese.   . Maintain normal blood lipids and cholesterol levels by exercising and minimizing your intake of saturated fat. Eat a balanced diet with plenty of fruit and vegetables. Blood tests for lipids and cholesterol should begin at age 44 and be repeated every 5 years. If your lipid or cholesterol levels are high, you are over 50, or you are at high risk for heart disease, you may need your cholesterol levels checked more frequently. Ongoing high lipid and cholesterol levels should be treated with medicines if diet and exercise are not working.  . If you smoke, find out from your health care  provider how to quit. If you do not use tobacco, please do not start.  . If you choose to drink alcohol, please do not consume more than 2 drinks per day. One drink is considered to be 12 ounces (355 mL) of beer, 5 ounces (148 mL) of wine, or 1.5 ounces (44 mL) of liquor.  . If you are 63-80 years old, ask your health care provider if you should take aspirin to prevent strokes.  . Use sunscreen. Apply sunscreen liberally and repeatedly throughout the day. You should seek shade when your shadow is shorter than you. Protect yourself by wearing long sleeves, pants, a wide-brimmed hat, and sunglasses year round, whenever you are outdoors.  . Once a month, do a whole body skin exam, using a mirror to look at the skin on your back. Tell your health care provider of new moles, moles that have irregular borders, moles that are larger than a pencil eraser, or moles that have changed in shape or color.

## 2015-11-22 NOTE — Progress Notes (Signed)
Pre visit review using our clinic review tool, if applicable. No additional management support is needed unless otherwise documented below in the visit note. 

## 2015-11-22 NOTE — Progress Notes (Signed)
Subjective:   Donna Murphy is a 70 y.o. female who presents for Medicare Annual (Subsequent) preventive examination.  Review of Systems:  N/A Cardiac Risk Factors include: advanced age (>54men, >84 women);diabetes mellitus;dyslipidemia;hypertension     Objective:     Vitals: BP 110/80 (BP Location: Left Arm, Patient Position: Sitting, Cuff Size: Normal)   Pulse 74   Temp 98.7 F (37.1 C) (Oral)   Ht 5' 3.75" (1.619 m) Comment: no shoes  Wt 142 lb 8 oz (64.6 kg)   SpO2 96%   BMI 24.65 kg/m   Body mass index is 24.65 kg/m.   Tobacco History  Smoking Status  . Never Smoker  Smokeless Tobacco  . Never Used     Counseling given: No   Past Medical History:  Diagnosis Date  . Allergic rhinitis   . DMII (diabetes mellitus, type 2) ([place])   . Fibroids    uterine  . GERD (gastroesophageal reflux disease)   . HLD (hyperlipidemia)   . HTN (hypertension)   . Irritable bowel syndrome   . Varices of other sites    Past Surgical History:  Procedure Laterality Date  . COLONOSCOPY  9/07   negative  . ENDOMETRIAL BIOPSY  10/01   negative  . TONSILLECTOMY     Family History  Problem Relation Age of Onset  . Coronary artery disease Mother   . Hyperlipidemia Mother   . Hypertension Mother   . Osteoporosis Mother   . Heart attack Mother     CABG in her 55's  . Diabetes Father   . Heart failure Father   . Hypertension Father   . Transient ischemic attack Father   . Diabetes      GF  . Hypertension Brother   . Heart murmur Son   . Brain cancer Sister    History  Sexual Activity  . Sexual activity: Yes    Outpatient Encounter Prescriptions as of 11/22/2015  Medication Sig  . aspirin 81 MG tablet Take 81 mg by mouth daily.    [name] atorvastatin (LIPITOR) 40 MG tablet TAKE 1 TABLET BY MOUTH DAILY  . Cholecalciferol (VITAMIN D) 2000 UNITS tablet Take 2,000 Units by mouth daily.  . Cyanocobalamin (B-12 PO) Take by mouth.  [name] glimepiride (AMARYL) 4 MG tablet Take 4 mg  by mouth 2 (two) times daily with a meal.  . glucose blood (ONETOUCH VERIO) test strip Ck blood sugar once daily and as directed. Dx E11.9  . Krill Oil 300 MG CAPS Take 1 capsule by mouth daily.  [name] loratadine (CLARITIN) 10 MG tablet Take 10 mg by mouth daily as needed.   [name] losartan (COZAAR) 50 MG tablet TAKE 1 TABLET BY MOUTH EVERY DAY  . metFORMIN (GLUCOPHAGE) 500 MG tablet Take 1,000 mg by mouth 2 (two) times daily with a meal.  . Multiple Vitamin (MULTIVITAMIN) tablet Take 1 tablet by mouth daily.    [name] omeprazole (PRILOSEC) 20 MG capsule Take 1 capsule (20 mg total) by mouth daily.  [name] triamcinolone (KENALOG) 0.025 % cream Apply topically daily as needed. Apply to affected areas once daily as needed  . Triamcinolone Acetonide (NASACORT ALLERGY 24HR NA) Place 1 spray into the nose daily as needed.   . sitaGLIPtan-metformin (JANUMET) 50-1000 MG per tablet Take 1 tablet by mouth 2 (two) times daily with a meal.     No facility-administered encounter medications on file as of 11/22/2015.     Activities of Daily Living In your present state of  health, do you have any difficulty performing the following activities: 11/22/2015  Hearing? N  Vision? N  Difficulty concentrating or making decisions? N  Walking or climbing stairs? N  Dressing or bathing? N  Doing errands, shopping? N  Preparing Food and eating ? N  Using the Toilet? N  In the past six months, have you accidently leaked urine? N  Do you have problems with loss of bowel control? N  Managing your Medications? N  Managing your Finances? N  Housekeeping or managing your Housekeeping? N  Some recent data might be hidden    Patient Care Team: Abner Greenspan, MD as PCP - [place], OD as Referring Physician (Optometry) Jacelyn Pi, MD as Consulting Physician (Endocrinology)    Assessment:     Hearing Screening   125Hz  250Hz  500Hz  1000Hz  2000Hz  3000Hz  4000Hz  6000Hz  8000Hz   Right ear:   40 40 40  40    Left ear:    40 40 40  40    Vision Screening Comments: Last vision exam on 11/15/2015 with Dr. Kerin Ransom   Exercise Activities and Dietary recommendations Current Exercise Habits: Home exercise routine, Type of exercise: strength training/weights;walking, Time (Minutes): 45, Frequency (Times/Week): 7, Weekly Exercise (Minutes/Week): 315, Intensity: Moderate, Exercise limited by: None identified  Goals    . Increase physical activity          Starting 11/22/2015, I will continue to exercise at least 45 min daily.       Fall Risk Fall Risk  11/22/2015 11/27/2014 11/12/2013  Falls in the past year? No No No   Depression Screen PHQ 2/9 Scores 11/22/2015 11/27/2014 11/12/2013  PHQ - 2 Score 0 0 0     Cognitive Testing MMSE - Mini Mental State Exam 11/22/2015  Orientation to time 5  Orientation to Place 5  Registration 3  Attention/ Calculation 0  Recall 3  Language- name 2 objects 0  Language- repeat 1  Language- follow 3 step command 3  Language- read & follow direction 0  Write a sentence 0  Copy design 0  Total score 20   PLEASE NOTE: A Mini-Cog screen was completed. Maximum score is 20. A value of 0 denotes this part of Folstein MMSE was not completed or the patient failed this part of the Mini-Cog screening.   Mini-Cog Screening Orientation to Time - Max 5 pts Orientation to Place - Max 5 pts Registration - Max 3 pts Recall - Max 3 pts Language Repeat - Max 1 pts Language Follow 3 Step Command - Max 3 pts  Immunization History  Administered Date(s) Administered  . H1N1 04/24/2008  . Influenza Split 02/16/2011, 12/02/2012, 01/01/2014  . Influenza Whole 02/06/2007, 12/30/2007, 01/05/2009  . Influenza,inj,Quad PF,36+ Mos 01/20/2015  . Pneumococcal Conjugate-13 11/27/2014  . Pneumococcal Polysaccharide-23 11/17/2009  . Td 11/06/2005  . Zoster 03/03/2013   Screening Tests Health Maintenance  Topic Date Due  . INFLUENZA VACCINE  04/02/2016 (Originally 11/02/2015)  . MAMMOGRAM   04/02/2016 (Originally 12/16/2014)  . DEXA SCAN  04/02/2016 (Originally 11/30/2011)  . TETANUS/TDAP  11/21/2016 (Originally 11/07/2015)  . FOOT EXAM  11/27/2015  . PNA vac Low Risk Adult (2 of 2 - PPSV23) 11/27/2015  . COLONOSCOPY  12/27/2015  . HEMOGLOBIN A1C  05/24/2016  . OPHTHALMOLOGY EXAM  11/14/2016  . ZOSTAVAX  Completed  . Hepatitis C Screening  Completed      Plan:     I have personally reviewed and addressed the Medicare Annual Wellness questionnaire  and have noted the following in the patient's chart:  A. Medical and social history B. Use of alcohol, tobacco or illicit drugs  C. Current medications and supplements D. Functional ability and status E.  Nutritional status F.  Physical activity G. Advance directives H. List of other physicians I.  Hospitalizations, surgeries, and ER visits in previous 12 months J.  [place] to include hearing, vision, cognitive, depression L. Referrals and appointments - none  In addition, I have reviewed and discussed with patient certain preventive protocols, quality metrics, and best practice recommendations. A written personalized care plan for preventive services as well as general preventive health recommendations were provided to patient.  See attached scanned questionnaire for additional information.   Signed,   Lindell Noe, MHA, BS, LPN Health Advisor

## 2015-11-23 ENCOUNTER — Ambulatory Visit (INDEPENDENT_AMBULATORY_CARE_PROVIDER_SITE_OTHER): Payer: Medicare Other | Admitting: Family Medicine

## 2015-11-23 ENCOUNTER — Encounter: Payer: Self-pay | Admitting: Internal Medicine

## 2015-11-23 ENCOUNTER — Encounter: Payer: Self-pay | Admitting: Family Medicine

## 2015-11-23 VITALS — BP 128/70 | HR 78 | Temp 98.3°F | Ht 64.0 in | Wt 142.5 lb

## 2015-11-23 DIAGNOSIS — IMO0001 Reserved for inherently not codable concepts without codable children: Secondary | ICD-10-CM

## 2015-11-23 DIAGNOSIS — I10 Essential (primary) hypertension: Secondary | ICD-10-CM | POA: Diagnosis not present

## 2015-11-23 DIAGNOSIS — E785 Hyperlipidemia, unspecified: Secondary | ICD-10-CM | POA: Diagnosis not present

## 2015-11-23 DIAGNOSIS — E119 Type 2 diabetes mellitus without complications: Secondary | ICD-10-CM | POA: Diagnosis not present

## 2015-11-23 DIAGNOSIS — E1165 Type 2 diabetes mellitus with hyperglycemia: Secondary | ICD-10-CM

## 2015-11-23 DIAGNOSIS — Z1211 Encounter for screening for malignant neoplasm of colon: Secondary | ICD-10-CM | POA: Diagnosis not present

## 2015-11-23 NOTE — Assessment & Plan Note (Signed)
Pt is overdue to see Dr Chalmers Cater  She will make her own appt Lab Results  Component Value Date   HGBA1C 9.5 (H) 11/22/2015   Had to stop Januvia in the donut hole -blood sugar control has worsened  She is aware and will contact her endocrinologists office

## 2015-11-23 NOTE — Progress Notes (Signed)
Subjective:    Patient ID: Donna Murphy, female    DOB: 02/24/1946, 70 y.o.   MRN: [id number]  HPI Here for annual f/u of chronic medical problems   Doing ok except for her blood sugar   Also more aches and pains in her legs in general   Stress was worse last year- Fritz Pickerel had a stem cell transplant   Seen for [facility] yesterday Hep C completed  Tetanus -last 8/07- will see if covered in a pharmacy  Mammogram ordered (last 9/15) Was ordered- no appt yet-she will schedule  Self exam-no breast lumps  No gyn issues except vaginal dryness - some lubricants do not work or irritate - [place] is the best   dexa ordered - will schedule on the way out  No falls or fractures   utd eye exam this month  No retinopathy  Colonoscopy 9/07- 5 y recall due to to hx of polyps  Has not had that colonoscopy  Will schedule that   Wt Readings from Last 3 Encounters:  11/23/15 142 lb 8 oz (64.6 kg)  11/22/15 142 lb 8 oz (64.6 kg)  11/27/14 144 lb (65.3 kg)  bmi is 24  Hx of DM2 Lab Results  Component Value Date   HGBA1C 9.5 (H) 11/22/2015  hit the donut hole with Janumet - and had to stop it  Now out of the donut hole  Is due for f/u with Dr Chalmers Cater  Exercises regularly  Diet- eats a dark chocolate low in sugar  One glass of wine at night  Tries hard to control carbs otherwise    bp is up a bit - was great yesterday  No cp or palpitations or headaches or edema  No side effects to medicines  BP Readings from Last 3 Encounters:  11/23/15 138/80  11/22/15 110/80  11/27/14 132/70     Hx of hyperlipidemia Lab Results  Component Value Date   CHOL 160 11/22/2015   CHOL 154 11/23/2014   CHOL 153 11/10/2013   Lab Results  Component Value Date   HDL 54.30 11/22/2015   HDL 51.70 11/23/2014   HDL 47.80 11/10/2013   Lab Results  Component Value Date   LDLCALC 80 11/22/2015   LDLCALC 77 11/23/2014   LDLCALC 81 11/10/2013   Lab Results  Component Value Date   TRIG 130.0 11/22/2015   TRIG 127.0 11/23/2014   TRIG 120.0 11/10/2013   Lab Results  Component Value Date   CHOLHDL 3 11/22/2015   CHOLHDL 3 11/23/2014   CHOLHDL 3 11/10/2013   No results found for: LDLDIRECT Diet and atorvastatin    Review of Systems Review of Systems  Constitutional: Negative for fever, appetite change, fatigue and unexpected weight change.  Eyes: Negative for pain and visual disturbance.  Respiratory: Negative for cough and shortness of breath.   Cardiovascular: Negative for cp or palpitations    Gastrointestinal: Negative for nausea, diarrhea and constipation.  Genitourinary: Negative for urgency and frequency.  Skin: Negative for pallor or rash   MSK pos for aches and pains in legs / knees -used to run  Neurological: Negative for weakness, light-headedness, numbness and headaches.  Hematological: Negative for adenopathy. Does not bruise/bleed easily.  Psychiatric/Behavioral: Negative for dysphoric mood. The patient is not nervous/anxious.         Objective:   Physical Exam  Constitutional: She appears well-developed and well-nourished. No distress.  Well appearing   HENT:  Head: Normocephalic and atraumatic.  Right Ear: External ear  normal.  Left Ear: External ear normal.  Mouth/Throat: Oropharynx is clear and moist.  Eyes: Conjunctivae and EOM are normal. Pupils are equal, round, and reactive to light. No scleral icterus.  Neck: Normal range of motion. Neck supple. No JVD present. Carotid bruit is not present. No thyromegaly present.  Cardiovascular: Normal rate, regular rhythm, normal heart sounds and intact distal pulses.  Exam reveals no gallop.   Pulmonary/Chest: Effort normal and breath sounds normal. No respiratory distress. She has no wheezes. She exhibits no tenderness.  Abdominal: Soft. Bowel sounds are normal. She exhibits no distension, no abdominal bruit and no mass. There is no tenderness.  Genitourinary: No breast swelling, tenderness, discharge or bleeding.    Genitourinary Comments: Breast exam: No mass, nodules, thickening, tenderness, bulging, retraction, inflamation, nipple discharge or skin changes noted.  No axillary or clavicular LA.      Musculoskeletal: Normal range of motion. She exhibits no edema or tenderness.  No kyphosis   Lymphadenopathy:    She has no cervical adenopathy.  Neurological: She is alert. She has normal reflexes. No cranial nerve deficit. She exhibits normal muscle tone. Coordination normal.  Skin: Skin is warm and dry. No rash noted. No erythema. No pallor.  Psychiatric: She has a normal mood and affect.  Seems fatigued          Assessment & Plan:   Problem List Items Addressed This Visit      Cardiovascular and Mediastinum   Essential hypertension    bp in fair control at this time  BP Readings from Last 1 Encounters:  11/23/15 128/70   No changes needed Disc lifstyle change with low sodium diet and exercise  Labs reviewed         Endocrine   Diabetes mellitus type 2, uncontrolled ([place])    Pt is overdue to see Dr Chalmers Cater  She will make her own appt Lab Results  Component Value Date   HGBA1C 9.5 (H) 11/22/2015   Had to stop Januvia in the donut hole -blood sugar control has worsened  She is aware and will contact her endocrinologists office         Other   Hyperlipidemia    Disc goals for lipids and reasons to control them Rev labs with pt Rev low sat fat diet in detail . will continue atorvastatin       Colon cancer screening    Overdue for recall colonoscopy with hx of polyps Ref for colonoscopy       Relevant Orders   Ambulatory referral to Gastroenterology    Other Visit Diagnoses    Type 2 diabetes mellitus without complication, without long-term current use of insulin ([place])    -  Primary

## 2015-11-23 NOTE — Assessment & Plan Note (Signed)
bp in fair control at this time  BP Readings from Last 1 Encounters:  11/23/15 128/70   No changes needed Disc lifstyle change with low sodium diet and exercise  Labs reviewed

## 2015-11-23 NOTE — Patient Instructions (Addendum)
Get back with Dr Chalmers Cater - make your own appt.  See the handout regarding tetanus shot- if not covered in a pharmacy get it at the health dept  Stop at checkout regarding referral for mammogram and bone density and colonoscopy  If you become interested in estrogen vaginal cream for dryness

## 2015-11-23 NOTE — Assessment & Plan Note (Signed)
Overdue for recall colonoscopy with hx of polyps Ref for colonoscopy

## 2015-11-23 NOTE — Assessment & Plan Note (Addendum)
Disc goals for lipids and reasons to control them Rev labs with pt Rev low sat fat diet in detail . will continue atorvastatin

## 2015-11-23 NOTE — Progress Notes (Signed)
Pre visit review using our clinic review tool, if applicable. No additional management support is needed unless otherwise documented below in the visit note. 

## 2015-12-23 ENCOUNTER — Ambulatory Visit: Payer: Medicare Other | Attending: Family Medicine

## 2015-12-23 ENCOUNTER — Other Ambulatory Visit: Payer: Medicare Other

## 2015-12-26 ENCOUNTER — Other Ambulatory Visit: Payer: Self-pay | Admitting: Family Medicine

## 2015-12-28 ENCOUNTER — Other Ambulatory Visit: Payer: Self-pay | Admitting: Family Medicine

## 2016-01-17 ENCOUNTER — Telehealth: Payer: Self-pay | Admitting: Family Medicine

## 2016-01-17 NOTE — Telephone Encounter (Signed)
Pt called regarding a bill she received from 8/22 cpe with Dr. Glori Bickers. She has spoken to billing [phone]) but didn't get any answers except that other charges were added. She doesn't understand what charges were billed. Also, she spoke with medicare and was told it wasn't filed as wellness exam. Please call to discuss.

## 2016-01-19 ENCOUNTER — Ambulatory Visit (INDEPENDENT_AMBULATORY_CARE_PROVIDER_SITE_OTHER): Payer: Medicare Other

## 2016-01-19 DIAGNOSIS — Z23 Encounter for immunization: Secondary | ICD-10-CM | POA: Diagnosis not present

## 2016-01-27 ENCOUNTER — Ambulatory Visit: Payer: Medicare Other | Admitting: *Deleted

## 2016-01-27 VITALS — Ht 64.0 in | Wt 146.2 lb

## 2016-01-27 DIAGNOSIS — Z86010 Personal history of colonic polyps: Secondary | ICD-10-CM

## 2016-01-27 NOTE — Progress Notes (Signed)
Patient denies any allergies to egg or soy products. Patient denies complications with anesthesia/sedation.  Patient denies oxygen use at home and denies diet medications. Emmi instructions for colonoscopy explained but patient denied.     

## 2016-02-08 DIAGNOSIS — I10 Essential (primary) hypertension: Secondary | ICD-10-CM | POA: Diagnosis not present

## 2016-02-08 DIAGNOSIS — E7800 Pure hypercholesterolemia, unspecified: Secondary | ICD-10-CM | POA: Diagnosis not present

## 2016-02-08 DIAGNOSIS — E1165 Type 2 diabetes mellitus with hyperglycemia: Secondary | ICD-10-CM | POA: Diagnosis not present

## 2016-02-15 DIAGNOSIS — E1165 Type 2 diabetes mellitus with hyperglycemia: Secondary | ICD-10-CM | POA: Diagnosis not present

## 2016-02-17 ENCOUNTER — Encounter: Payer: Self-pay | Admitting: Internal Medicine

## 2016-02-17 ENCOUNTER — Ambulatory Visit (AMBULATORY_SURGERY_CENTER): Payer: Medicare Other | Admitting: Internal Medicine

## 2016-02-17 VITALS — BP 120/65 | HR 67 | Temp 97.3°F | Resp 15 | Ht 64.0 in | Wt 146.0 lb

## 2016-02-17 DIAGNOSIS — C182 Malignant neoplasm of ascending colon: Secondary | ICD-10-CM

## 2016-02-17 DIAGNOSIS — Z1211 Encounter for screening for malignant neoplasm of colon: Secondary | ICD-10-CM | POA: Diagnosis not present

## 2016-02-17 DIAGNOSIS — E119 Type 2 diabetes mellitus without complications: Secondary | ICD-10-CM | POA: Diagnosis not present

## 2016-02-17 DIAGNOSIS — Z86010 Personal history of colonic polyps: Secondary | ICD-10-CM | POA: Diagnosis not present

## 2016-02-17 DIAGNOSIS — K219 Gastro-esophageal reflux disease without esophagitis: Secondary | ICD-10-CM | POA: Diagnosis not present

## 2016-02-17 DIAGNOSIS — D122 Benign neoplasm of ascending colon: Secondary | ICD-10-CM | POA: Diagnosis not present

## 2016-02-17 DIAGNOSIS — Z1212 Encounter for screening for malignant neoplasm of rectum: Secondary | ICD-10-CM

## 2016-02-17 DIAGNOSIS — I10 Essential (primary) hypertension: Secondary | ICD-10-CM | POA: Diagnosis not present

## 2016-02-17 MED ORDER — SODIUM CHLORIDE 0.9 % IV SOLN: 500.0000 mL | INTRAVENOUS | Status: DC

## 2016-02-17 NOTE — Progress Notes (Signed)
Called to room to assist during endoscopic procedure.  Patient ID and intended procedure confirmed with present staff. Received instructions for my participation in the procedure from the performing physician.  

## 2016-02-17 NOTE — Op Note (Signed)
[facility] Patient Name: Donna Murphy Procedure Date: 02/17/2016 9:07 AM MRN: [id number] Endoscopist: Gatha Mayer , MD Age: 70 Referring MD:  Date of Birth: [birth year] Gender: Female Account #: [id number] Procedure:                Colonoscopy Indications:              High risk colon cancer surveillance: Personal                            history of colonic polyps Medicines:                Propofol per Anesthesia, Monitored Anesthesia Care Procedure:                Pre-Anesthesia Assessment:                           - Prior to the procedure, a History and Physical                            was performed, and patient medications and                            allergies were reviewed. The patient's tolerance of                            previous anesthesia was also reviewed. The risks                            and benefits of the procedure and the sedation                            options and risks were discussed with the patient.                            All questions were answered, and informed consent                            was obtained. Prior Anticoagulants: The patient                            last took aspirin 1 day prior to the procedure. ASA                            Grade Assessment: II - A patient with mild systemic                            disease. After reviewing the risks and benefits,                            the patient was deemed in satisfactory condition to                            undergo the procedure.  After obtaining informed consent, the colonoscope                            was passed under direct vision. Throughout the                            procedure, the patient's blood pressure, pulse, and                            oxygen saturations were monitored continuously. The                            Model CF-HQ190L [id number]) scope was introduced                            through the anus and  advanced to the the cecum,                            identified by appendiceal orifice and ileocecal                            valve. The colonoscopy was performed without                            difficulty. The patient tolerated the procedure                            well. The quality of the bowel preparation was                            excellent. The bowel preparation used was Miralax.                            The ileocecal valve, appendiceal orifice, and                            rectum were photographed. Scope In: 9:20:15 AM Scope Out: 9:36:13 AM Scope Withdrawal Time: 0 hours 13 minutes 0 seconds  Total Procedure Duration: 0 hours 15 minutes 58 seconds  Findings:                 The perianal and digital rectal examinations were                            normal.                           Two sessile polyps were found in the ascending                            colon. The polyps were 3 to 7 mm in size. These                            polyps were removed with a cold snare. Resection  and retrieval were complete. Verification of                            patient identification for the specimen was done.                            Estimated blood loss was minimal.                           Multiple diverticula were found in the entire colon.                           The exam was otherwise without abnormality on                            direct and retroflexion views. Complications:            No immediate complications. Estimated Blood Loss:     Estimated blood loss was minimal. Impression:               - Two 3 to 7 mm polyps in the ascending colon,                            removed with a cold snare. Resected and retrieved.                           - Diverticulosis in the entire examined colon.                           - The examination was otherwise normal on direct                            and retroflexion views.                            - Personal history of colonic polyps. Recommendation:           - Patient has a contact number available for                            emergencies. The signs and symptoms of potential                            delayed complications were discussed with the                            patient. Return to normal activities tomorrow.                            Written discharge instructions were provided to the                            patient.                           - Resume previous diet.                           -  Continue present medications.                           - Repeat colonoscopy is recommended for                            surveillance. The colonoscopy date will be                            determined after pathology results from today's                            exam become available for review. Gatha Mayer, MD 02/17/2016 9:43:47 AM This report has been signed electronically.

## 2016-02-17 NOTE — Patient Instructions (Addendum)
I found and removed 2 small polyps that look benign.  You also have a condition called diverticulosis - common and not usually a problem. Please read the handout provided.  I appreciate the opportunity to care for you. Gatha Mayer, MD, FACG  YOU HAD AN ENDOSCOPIC PROCEDURE TODAY AT [facility]:   Refer to the procedure report that was given to you for any specific questions about what was found during the examination.  If the procedure report does not answer your questions, please call your gastroenterologist to clarify.  If you requested that your care partner not be given the details of your procedure findings, then the procedure report has been included in a sealed envelope for you to review at your convenience later.  YOU SHOULD EXPECT: Some feelings of bloating in the abdomen. Passage of more gas than usual.  Walking can help get rid of the air that was put into your GI tract during the procedure and reduce the bloating. If you had a lower endoscopy (such as a colonoscopy or flexible sigmoidoscopy) you may notice spotting of blood in your stool or on the toilet paper. If you underwent a bowel prep for your procedure, you may not have a normal bowel movement for a few days.  Please Note:  You might notice some irritation and congestion in your nose or some drainage.  This is from the oxygen used during your procedure.  There is no need for concern and it should clear up in a day or so.  SYMPTOMS TO REPORT IMMEDIATELY:   Following lower endoscopy (colonoscopy or flexible sigmoidoscopy):  Excessive amounts of blood in the stool  Significant tenderness or worsening of abdominal pains  Swelling of the abdomen that is new, acute  Fever of 100F or higher  For urgent or emergent issues, a gastroenterologist can be reached at any hour by calling [phone].  DIET:  We do recommend a small meal at first, but then you may proceed to your regular diet.  Drink  plenty of fluids but you should avoid alcoholic beverages for 24 hours.  ACTIVITY:  You should plan to take it easy for the rest of today and you should NOT DRIVE or use heavy machinery until tomorrow (because of the sedation medicines used during the test).    FOLLOW UP: Our staff will call the number listed on your records the next business day following your procedure to check on you and address any questions or concerns that you may have regarding the information given to you following your procedure. If we do not reach you, we will leave a message.  However, if you are feeling well and you are not experiencing any problems, there is no need to return our call.  We will assume that you have returned to your regular daily activities without incident.  If any biopsies were taken you will be contacted by phone or by letter within the next 1-3 weeks.  Please call us at [phone] if you have not heard about the biopsies in 3 weeks.   SIGNATURES/CONFIDENTIALITY: You and/or your care partner have signed paperwork which will be entered into your electronic medical record.  These signatures attest to the fact that that the information above on your After Visit Summary has been reviewed and is understood.  Full responsibility of the confidentiality of this discharge information lies with you and/or your care-partner.  Await pathology  Please read over handouts about polyps and diverticulosis  Continue your normal medications

## 2016-02-17 NOTE — Progress Notes (Signed)
To recovery VSS report to [employer]

## 2016-02-18 ENCOUNTER — Telehealth: Payer: Self-pay

## 2016-02-18 NOTE — Telephone Encounter (Signed)
  Follow up Call-  Call back number 02/17/2016  Post procedure Call Back phone  # [phone]  Permission to leave phone message Yes  Some recent data might be hidden     Patient questions:  Do you have a fever, pain , or abdominal swelling? No. Pain Score  0 *  Have you tolerated food without any problems? Yes.    Have you been able to return to your normal activities? Yes.    Do you have any questions about your discharge instructions: Diet   No. Medications  No. Follow up visit  No.  Do you have questions or concerns about your Care? No.  Actions: * If pain score is 4 or above: No action needed, pain <4.

## 2016-02-18 NOTE — Telephone Encounter (Signed)
  Follow up Call-  Call back number 02/17/2016  Post procedure Call Back phone  # [phone]  Permission to leave phone message Yes  Some recent data might be hidden    Patient was called for follow up after her procedure on 02/17/2016. No answer at the number given for follow up phone call. A message was left on the answering machine.

## 2016-03-01 ENCOUNTER — Encounter: Payer: Self-pay | Admitting: Internal Medicine

## 2016-03-01 DIAGNOSIS — Z860100 Personal history of colon polyps, unspecified: Secondary | ICD-10-CM | POA: Insufficient documentation

## 2016-03-01 HISTORY — DX: Personal history of colonic polyps: Z86.010

## 2016-03-01 NOTE — Progress Notes (Signed)
Ssp/a w/ focus HGC and tubular adenoma Recall 2020

## 2016-03-01 NOTE — Progress Notes (Signed)
RECALL COLON 2018 NOT 2020

## 2016-03-23 DIAGNOSIS — E7800 Pure hypercholesterolemia, unspecified: Secondary | ICD-10-CM | POA: Diagnosis not present

## 2016-03-23 DIAGNOSIS — E1165 Type 2 diabetes mellitus with hyperglycemia: Secondary | ICD-10-CM | POA: Diagnosis not present

## 2016-03-23 DIAGNOSIS — I10 Essential (primary) hypertension: Secondary | ICD-10-CM | POA: Diagnosis not present

## 2016-06-23 ENCOUNTER — Other Ambulatory Visit: Payer: Self-pay | Admitting: Family Medicine

## 2016-09-18 DIAGNOSIS — E1165 Type 2 diabetes mellitus with hyperglycemia: Secondary | ICD-10-CM | POA: Diagnosis not present

## 2016-09-18 DIAGNOSIS — I10 Essential (primary) hypertension: Secondary | ICD-10-CM | POA: Diagnosis not present

## 2016-09-18 DIAGNOSIS — E7800 Pure hypercholesterolemia, unspecified: Secondary | ICD-10-CM | POA: Diagnosis not present

## 2016-09-22 ENCOUNTER — Other Ambulatory Visit: Payer: Self-pay | Admitting: Family Medicine

## 2016-09-22 NOTE — Telephone Encounter (Signed)
Info given to Lattie Haw so she can schedule AWV/ CPE, and med refill

## 2016-09-22 NOTE — Telephone Encounter (Signed)
No recent/future appts., please advise  

## 2016-09-22 NOTE — Telephone Encounter (Signed)
Please schedule f/u or PE in the fall and refill until then  Thanks

## 2016-09-23 ENCOUNTER — Other Ambulatory Visit: Payer: Self-pay | Admitting: Family Medicine

## 2016-09-26 ENCOUNTER — Telehealth: Payer: Self-pay | Admitting: Family Medicine

## 2016-09-26 NOTE — Telephone Encounter (Signed)
Left pt message asking to call Ebony Hail back directly at [phone] to schedule AWV + labs with Katha Cabal and CPE with PCP.  NOTE* Due after 11/22/16

## 2016-10-27 NOTE — Telephone Encounter (Signed)
Scheduled 11/27/16 °

## 2016-11-20 ENCOUNTER — Telehealth: Payer: Self-pay | Admitting: Family Medicine

## 2016-11-20 DIAGNOSIS — E7800 Pure hypercholesterolemia, unspecified: Secondary | ICD-10-CM

## 2016-11-20 DIAGNOSIS — I10 Essential (primary) hypertension: Secondary | ICD-10-CM

## 2016-11-20 NOTE — Telephone Encounter (Signed)
-----   Message from Ellamae Sia sent at 11/20/2016  8:53 AM EDT ----- Regarding: Lab orders for Tuesday, 8.21.18  AWV lab orders, please.

## 2016-11-27 ENCOUNTER — Ambulatory Visit (INDEPENDENT_AMBULATORY_CARE_PROVIDER_SITE_OTHER): Payer: Medicare Other

## 2016-11-27 VITALS — BP 118/72 | HR 93 | Temp 98.9°F | Ht 63.5 in | Wt 133.8 lb

## 2016-11-27 DIAGNOSIS — Z1231 Encounter for screening mammogram for malignant neoplasm of breast: Secondary | ICD-10-CM

## 2016-11-27 DIAGNOSIS — Z0000 Encounter for general adult medical examination without abnormal findings: Secondary | ICD-10-CM

## 2016-11-27 DIAGNOSIS — I10 Essential (primary) hypertension: Secondary | ICD-10-CM | POA: Diagnosis not present

## 2016-11-27 DIAGNOSIS — Z23 Encounter for immunization: Secondary | ICD-10-CM

## 2016-11-27 DIAGNOSIS — E7800 Pure hypercholesterolemia, unspecified: Secondary | ICD-10-CM | POA: Diagnosis not present

## 2016-11-27 DIAGNOSIS — E2839 Other primary ovarian failure: Secondary | ICD-10-CM

## 2016-11-27 DIAGNOSIS — Z1239 Encounter for other screening for malignant neoplasm of breast: Secondary | ICD-10-CM

## 2016-11-27 NOTE — Progress Notes (Signed)
PCP notes:   Health maintenance:  Flu vaccine - addressed Eye exam - addressed; pt plans to schedule future appt Mammogram - order created Bone density - order created  A1C - per pt, lab test in June 2018/result 6.1 Tetanus - postponed/PCP please address at next appt Foot exam - per pt, exam in June 2018 PPSV23 - administered  Abnormal screenings:   Depression score: 2  Patient concerns:   Patient has requested lab results faxed to endocrinologist.   Nurse concerns:  None  Next PCP appt:   12/05/16 @ 1515  I reviewed health advisor's note, was available for consultation, and agree with documentation and plan. Loura Pardon MD

## 2016-11-27 NOTE — Progress Notes (Signed)
Subjective:   Donna Murphy is a 71 y.o. female who presents for Medicare Annual (Subsequent) preventive examination.  Review of Systems:  N/A Cardiac Risk Factors include: advanced age (>25men, >10 women);diabetes mellitus;dyslipidemia;hypertension     Objective:     Vitals: BP 118/72 (BP Location: Right Arm, Patient Position: Sitting, Cuff Size: Normal)   Pulse 93   Temp 98.9 F (37.2 C) (Oral)   Ht 5' 3.5" (1.613 m) Comment: no shoes  Wt 133 lb 12 oz (60.7 kg)   SpO2 94%   BMI 23.32 kg/m   Body mass index is 23.32 kg/m.   Tobacco History  Smoking Status  . Never Smoker  Smokeless Tobacco  . Never Used     Counseling given: No   Past Medical History:  Diagnosis Date  . Allergic rhinitis   . Allergy   . DMII (diabetes mellitus, type 2) ([place])   . Fibroids    uterine  . GERD (gastroesophageal reflux disease)   . HLD (hyperlipidemia)   . HTN (hypertension)   . Hx of colonic polyps 03/01/2016  . Irritable bowel syndrome   . Kidney stone    passed stone-no surgery required  . Skin cancer of lip    basal cell  . Varices of other sites    Past Surgical History:  Procedure Laterality Date  . COLONOSCOPY  9/07   negative - gessner  . ENDOMETRIAL BIOPSY  10/01   negative  . TONSILLECTOMY    . TOOTH EXTRACTION     Family History  Problem Relation Age of Onset  . Coronary artery disease Mother   . Hyperlipidemia Mother   . Hypertension Mother   . Osteoporosis Mother   . Heart attack Mother        CABG in her 61's  . Diabetes Father   . Heart failure Father   . Hypertension Father   . Transient ischemic attack Father   . Diabetes Unknown        GF  . Hypertension Brother   . Heart murmur Son   . Brain cancer Sister   . Colon cancer Neg Hx   . Esophageal cancer Neg Hx   . Stomach cancer Neg Hx    History  Sexual Activity  . Sexual activity: Yes  . Birth control/ protection: Post-menopausal    Outpatient Encounter Prescriptions as of  11/27/2016  Medication Sig  . aspirin 81 MG tablet Take 81 mg by mouth daily.    [name] atorvastatin (LIPITOR) 40 MG tablet TAKE 1 TABLET BY MOUTH DAILY  . canagliflozin (INVOKANA) 100 MG TABS tablet Take 100 mg by mouth daily before breakfast.  . Cholecalciferol (VITAMIN D) 2000 UNITS tablet Take 2,000 Units by mouth daily.  . Cyanocobalamin (B-12 PO) Take by mouth.  [name] glimepiride (AMARYL) 4 MG tablet Take 4 mg by mouth 2 (two) times daily with a meal.  . glucose blood (ONETOUCH VERIO) test strip Ck blood sugar once daily and as directed. Dx E11.9  . JANUVIA 100 MG tablet TAKE 1 TABLET BY MOUTH ONCE DAILY FOR 30 DAYS  . Krill Oil 300 MG CAPS Take 1 capsule by mouth daily.  [name] loratadine (CLARITIN) 10 MG tablet Take 10 mg by mouth daily as needed.   [name] losartan (COZAAR) 50 MG tablet TAKE 1 TABLET BY MOUTH EVERY DAY  . metFORMIN (GLUCOPHAGE) 500 MG tablet Take 1,000 mg by mouth 2 (two) times daily with a meal.  . Multiple Vitamin (MULTIVITAMIN) tablet Take 1  tablet by mouth daily.    [name] omeprazole (PRILOSEC) 20 MG capsule Take 1 capsule (20 mg total) by mouth daily.  . Rosiglitazone Maleate (AVANDIA PO) Take by mouth.  . triamcinolone (KENALOG) 0.025 % cream Apply topically daily as needed. Apply to affected areas once daily as needed  . Triamcinolone Acetonide (NASACORT ALLERGY 24HR NA) Place 1 spray into the nose daily as needed.    Facility-Administered Encounter Medications as of 11/27/2016  Medication  . 0.9 %  sodium chloride infusion    Activities of Daily Living In your present state of health, do you have any difficulty performing the following activities: 11/27/2016  Hearing? N  Vision? N  Difficulty concentrating or making decisions? N  Walking or climbing stairs? N  Dressing or bathing? N  Doing errands, shopping? N  Preparing Food and eating ? N  Using the Toilet? N  In the past six months, have you accidently leaked urine? N  Do you have problems with loss of bowel control? N    Managing your Medications? N  Managing your Finances? N  Housekeeping or managing your Housekeeping? N  Some recent data might be hidden    Patient Care Team: Tower, [name], MD as PCP - General Kerin Ransom Stephannie Li, OD as Referring Physician (Optometry) Jacelyn Pi, MD as Consulting Physician (Endocrinology)    Assessment:     Hearing Screening   125Hz  250Hz  500Hz  1000Hz  2000Hz  3000Hz  4000Hz  6000Hz  8000Hz   Right ear:   40 40 40  40    Left ear:   40 40 40  40    Vision Screening Comments: Last vision exam in August 2017   Exercise Activities and Dietary recommendations Current Exercise Habits: Home exercise routine, Type of exercise: walking, Time (Minutes): 45, Frequency (Times/Week): 7, Weekly Exercise (Minutes/Week): 315, Intensity: Mild, Exercise limited by: None identified  Goals    . Increase physical activity          Starting 11/27/2016, I will continue to exercise at least 45 min daily.       Fall Risk Fall Risk  11/27/2016 11/22/2015 11/27/2014 11/12/2013  Falls in the past year? No No No No   Depression Screen PHQ 2/9 Scores 11/27/2016 11/22/2015 11/27/2014 11/12/2013  PHQ - 2 Score 0 0 0 0  PHQ- 9 Score 2 - - -     Cognitive Function MMSE - Mini Mental State Exam 11/27/2016 11/22/2015  Orientation to time 5 5  Orientation to Place 5 5  Registration 3 3  Attention/ Calculation 0 0  Recall 3 3  Language- name 2 objects 0 0  Language- repeat 1 1  Language- follow 3 step command 3 3  Language- read & follow direction 0 0  Write a sentence 0 0  Copy design 0 0  Total score 20 20       PLEASE NOTE: A Mini-Cog screen was completed. Maximum score is 20. A value of 0 denotes this part of Folstein MMSE was not completed or the patient failed this part of the Mini-Cog screening.   Mini-Cog Screening Orientation to Time - Max 5 pts Orientation to Place - Max 5 pts Registration - Max 3 pts Recall - Max 3 pts Language Repeat - Max 1 pts Language Follow 3 Step  Command - Max 3 pts   Immunization History  Administered Date(s) Administered  . H1N1 04/24/2008  . Influenza Split 02/16/2011, 12/02/2012, 01/01/2014  . Influenza Whole 02/06/2007, 12/30/2007, 01/05/2009  . Influenza,inj,Quad PF,6+ Mos 01/20/2015,  01/19/2016  . Pneumococcal Conjugate-13 11/27/2014  . Pneumococcal Polysaccharide-23 11/17/2009, 11/27/2016  . Td 11/06/2005  . Zoster 03/03/2013   Screening Tests Health Maintenance  Topic Date Due  . INFLUENZA VACCINE  07/01/2017 (Originally 11/01/2016)  . OPHTHALMOLOGY EXAM  11/16/2017 (Originally 11/17/2016)  . MAMMOGRAM  11/27/2017 (Originally 12/16/2014)  . DEXA SCAN  11/27/2017 (Originally 11/30/2011)  . TETANUS/TDAP  11/05/2025 (Originally 11/07/2015)  . COLONOSCOPY  02/16/2017  . HEMOGLOBIN A1C  03/03/2017  . FOOT EXAM  09/01/2017  . Hepatitis C Screening  Completed  . PNA vac Low Risk Adult  Completed      Plan:     I have personally reviewed and addressed the Medicare Annual Wellness questionnaire and have noted the following in the patient's chart:  A. Medical and social history B. Use of alcohol, tobacco or illicit drugs  C. Current medications and supplements D. Functional ability and status E.  Nutritional status F.  Physical activity G. Advance directives H. List of other physicians I.  Hospitalizations, surgeries, and ER visits in previous 12 months J.  [place] to include hearing, vision, cognitive, depression L. Referrals and appointments - none  In addition, I have reviewed and discussed with patient certain preventive protocols, quality metrics, and best practice recommendations. A written personalized care plan for preventive services as well as general preventive health recommendations were provided to patient.  See attached scanned questionnaire for additional information.   Signed,   Lindell Noe, MHA, BS, LPN Health Coach

## 2016-11-27 NOTE — Progress Notes (Signed)
Pre visit review using our clinic review tool, if applicable. No additional management support is needed unless otherwise documented below in the visit note. 

## 2016-11-27 NOTE — Patient Instructions (Signed)
Ms. Lahman , Thank you for taking time to come for your Medicare Wellness Visit. I appreciate your ongoing commitment to your health goals. Please review the following plan we discussed and let me know if I can assist you in the future.   These are the goals we discussed: Goals    . Increase physical activity          Starting 11/27/2016, I will continue to exercise at least 45 min daily.        This is a list of the screening recommended for you and due dates:  Health Maintenance  Topic Date Due  . Flu Shot  07/01/2017*  . Eye exam for diabetics  11/16/2017*  . Mammogram  11/27/2017*  . DEXA scan (bone density measurement)  11/27/2017*  . Tetanus Vaccine  11/05/2025*  . Colon Cancer Screening  02/16/2017  . Hemoglobin A1C  03/03/2017  . Complete foot exam   09/01/2017  .  Hepatitis C: One time screening is recommended by Center for Disease Control  (CDC) for  adults born from 50 through 1964.   Completed  . Pneumonia vaccines  Completed  *Topic was postponed. The date shown is not the original due date.   Preventive Care for Adults  A healthy lifestyle and preventive care can promote health and wellness. Preventive health guidelines for adults include the following key practices.  . A routine yearly physical is a good way to check with your health care provider about your health and preventive screening. It is a chance to share any concerns and updates on your health and to receive a thorough exam.  . Visit your dentist for a routine exam and preventive care every 6 months. Brush your teeth twice a day and floss once a day. Good oral hygiene prevents tooth decay and gum disease.  . The frequency of eye exams is based on your age, health, family medical history, use  of contact lenses, and other factors. Follow your health care provider's ecommendations for frequency of eye exams.  . Eat a healthy diet. Foods like vegetables, fruits, whole grains, low-fat dairy products, and  lean protein foods contain the nutrients you need without too many calories. Decrease your intake of foods high in solid fats, added sugars, and salt. Eat the right amount of calories for you. Get information about a proper diet from your health care provider, if necessary.  . Regular physical exercise is one of the most important things you can do for your health. Most adults should get at least 150 minutes of moderate-intensity exercise (any activity that increases your heart rate and causes you to sweat) each week. In addition, most adults need muscle-strengthening exercises on 2 or more days a week.  Silver Sneakers may be a benefit available to you. To determine eligibility, you may visit the website: www.silversneakers.com or contact program at 204-532-9079 Mon-Fri between 8AM-8PM.   . Maintain a healthy weight. The body mass index (BMI) is a screening tool to identify possible weight problems. It provides an estimate of body fat based on height and weight. Your health care provider can find your BMI and can help you achieve or maintain a healthy weight.   For adults 20 years and older: ? A BMI below 18.5 is considered underweight. ? A BMI of 18.5 to 24.9 is normal. ? A BMI of 25 to 29.9 is considered overweight. ? A BMI of 30 and above is considered obese.   . Maintain normal blood lipids and  cholesterol levels by exercising and minimizing your intake of saturated fat. Eat a balanced diet with plenty of fruit and vegetables. Blood tests for lipids and cholesterol should begin at age 70 and be repeated every 5 years. If your lipid or cholesterol levels are high, you are over 50, or you are at high risk for heart disease, you may need your cholesterol levels checked more frequently. Ongoing high lipid and cholesterol levels should be treated with medicines if diet and exercise are not working.  . If you smoke, find out from your health care provider how to quit. If you do not use tobacco,  please do not start.  . If you choose to drink alcohol, please do not consume more than 2 drinks per day. One drink is considered to be 12 ounces (355 mL) of beer, 5 ounces (148 mL) of wine, or 1.5 ounces (44 mL) of liquor.  . If you are 13-21 years old, ask your health care provider if you should take aspirin to prevent strokes.  . Use sunscreen. Apply sunscreen liberally and repeatedly throughout the day. You should seek shade when your shadow is shorter than you. Protect yourself by wearing long sleeves, pants, a wide-brimmed hat, and sunglasses year round, whenever you are outdoors.  . Once a month, do a whole body skin exam, using a mirror to look at the skin on your back. Tell your health care provider of new moles, moles that have irregular borders, moles that are larger than a pencil eraser, or moles that have changed in shape or color.

## 2016-11-28 ENCOUNTER — Other Ambulatory Visit (INDEPENDENT_AMBULATORY_CARE_PROVIDER_SITE_OTHER): Payer: Medicare Other

## 2016-11-28 DIAGNOSIS — E7800 Pure hypercholesterolemia, unspecified: Secondary | ICD-10-CM

## 2016-11-28 DIAGNOSIS — I10 Essential (primary) hypertension: Secondary | ICD-10-CM

## 2016-11-28 DIAGNOSIS — Z0000 Encounter for general adult medical examination without abnormal findings: Secondary | ICD-10-CM | POA: Diagnosis not present

## 2016-11-28 LAB — COMPREHENSIVE METABOLIC PANEL
ALBUMIN: 4.3 g/dL (ref 3.5–5.2)
ALK PHOS: 58 U/L (ref 39–117)
ALT: 23 U/L (ref 0–35)
AST: 17 U/L (ref 0–37)
BUN: 21 mg/dL (ref 6–23)
CO2: 30 mEq/L (ref 19–32)
Calcium: 9.7 mg/dL (ref 8.4–10.5)
Chloride: 101 mEq/L (ref 96–112)
Creatinine, Ser: 0.72 mg/dL (ref 0.40–1.20)
GFR: 85.11 mL/min (ref >60.00)
Glucose, Bld: 157 mg/dL — ABNORMAL HIGH (ref 70–99)
POTASSIUM: 4.6 meq/L (ref 3.5–5.1)
Sodium: 137 mEq/L (ref 135–145)
TOTAL PROTEIN: 7.1 g/dL (ref 6.0–8.3)
Total Bilirubin: 0.7 mg/dL (ref 0.2–1.2)

## 2016-11-28 LAB — LIPID PANEL
CHOLESTEROL: 143 mg/dL (ref 0–200)
HDL: 56.1 mg/dL (ref >39.00)
LDL Cholesterol: 63 mg/dL (ref 0–99)
NONHDL: 87
TRIGLYCERIDES: 119 mg/dL (ref 0.0–149.0)
Total CHOL/HDL Ratio: 3
VLDL: 23.8 mg/dL (ref 0.0–40.0)

## 2016-11-28 LAB — CBC WITH DIFFERENTIAL/PLATELET
Basophils Absolute: 0 K/uL (ref 0.0–0.1)
Basophils Relative: 0.4 % (ref 0.0–3.0)
Eosinophils Absolute: 0.1 K/uL (ref 0.0–0.7)
Eosinophils Relative: 1.1 % (ref 0.0–5.0)
HCT: 44 % (ref 36.0–46.0)
Hemoglobin: 14.1 g/dL (ref 12.0–15.0)
Lymphocytes Relative: 20 % (ref 12.0–46.0)
Lymphs Abs: 2.4 K/uL (ref 0.7–4.0)
MCHC: 32.1 g/dL (ref 30.0–36.0)
MCV: 89.7 fl (ref 78.0–100.0)
Monocytes Absolute: 0.8 K/uL (ref 0.1–1.0)
Monocytes Relative: 6.5 % (ref 3.0–12.0)
Neutro Abs: 8.8 K/uL — ABNORMAL HIGH (ref 1.4–7.7)
Neutrophils Relative %: 72 % (ref 43.0–77.0)
Platelets: 327 K/uL (ref 150.0–400.0)
RBC: 4.9 Mil/uL (ref 3.87–5.11)
RDW: 13.6 % (ref 11.5–15.5)
WBC: 12.2 K/uL — ABNORMAL HIGH (ref 4.0–10.5)

## 2016-11-28 LAB — TSH: TSH: 1.2 u[IU]/mL (ref 0.35–4.50)

## 2016-12-05 ENCOUNTER — Encounter: Payer: Self-pay | Admitting: Family Medicine

## 2016-12-05 ENCOUNTER — Ambulatory Visit (INDEPENDENT_AMBULATORY_CARE_PROVIDER_SITE_OTHER): Payer: Medicare Other | Admitting: Family Medicine

## 2016-12-05 VITALS — BP 122/76 | HR 85 | Temp 98.4°F | Ht 63.5 in | Wt 135.5 lb

## 2016-12-05 DIAGNOSIS — Z23 Encounter for immunization: Secondary | ICD-10-CM | POA: Diagnosis not present

## 2016-12-05 DIAGNOSIS — I10 Essential (primary) hypertension: Secondary | ICD-10-CM

## 2016-12-05 DIAGNOSIS — E7800 Pure hypercholesterolemia, unspecified: Secondary | ICD-10-CM | POA: Diagnosis not present

## 2016-12-05 DIAGNOSIS — Z86010 Personal history of colonic polyps: Secondary | ICD-10-CM | POA: Diagnosis not present

## 2016-12-05 DIAGNOSIS — D72829 Elevated white blood cell count, unspecified: Secondary | ICD-10-CM

## 2016-12-05 DIAGNOSIS — IMO0001 Reserved for inherently not codable concepts without codable children: Secondary | ICD-10-CM

## 2016-12-05 DIAGNOSIS — Z1211 Encounter for screening for malignant neoplasm of colon: Secondary | ICD-10-CM

## 2016-12-05 DIAGNOSIS — E1165 Type 2 diabetes mellitus with hyperglycemia: Secondary | ICD-10-CM | POA: Diagnosis not present

## 2016-12-05 LAB — POC URINALSYSI DIPSTICK (AUTOMATED)
Bilirubin, UA: NEGATIVE
Blood, UA: NEGATIVE
Ketones, UA: NEGATIVE
LEUKOCYTES UA: NEGATIVE
Nitrite, UA: NEGATIVE
PROTEIN UA: NEGATIVE
Spec Grav, UA: 1.025 (ref 1.010–1.025)
UROBILINOGEN UA: 0.2 U/dL
pH, UA: 6 (ref 5.0–8.0)

## 2016-12-05 MED ORDER — ATORVASTATIN CALCIUM 40 MG PO TABS: 40.0000 mg | ORAL_TABLET | Freq: Every day | ORAL | 3 refills | Status: DC

## 2016-12-05 MED ORDER — TRIAMCINOLONE ACETONIDE 0.1 % EX CREA: 1.0000 "application " | TOPICAL_CREAM | Freq: Two times a day (BID) | CUTANEOUS | 0 refills | Status: DC

## 2016-12-05 MED ORDER — LOSARTAN POTASSIUM 50 MG PO TABS: 50.0000 mg | ORAL_TABLET | Freq: Every day | ORAL | 3 refills | Status: DC

## 2016-12-05 MED ORDER — OMEPRAZOLE 20 MG PO CPDR: 20.0000 mg | DELAYED_RELEASE_CAPSULE | Freq: Every day | ORAL | 3 refills | Status: DC

## 2016-12-05 NOTE — Assessment & Plan Note (Signed)
Pt will schedule her colonoscopy for nov (recall)

## 2016-12-05 NOTE — Assessment & Plan Note (Signed)
Sees Dr Chalmers Cater  4 medications  A1C 6.1  Doing well but some difficulty with finances in the donut hole May have to consider insulin in the future

## 2016-12-05 NOTE — Assessment & Plan Note (Signed)
Disc goals for lipids and reasons to control them Rev labs with pt Rev low sat fat diet in detail Well controlled but pt may not be tolerating statin  Will hold 1-2 wk and report back  Doubt she could get financial asst with pcyk9 inhibitor - but info given so she can do some research

## 2016-12-05 NOTE — Assessment & Plan Note (Signed)
Unsure of cause  UA is clear today  Re check 1 mo Watch for fever or other symptoms Nl exam

## 2016-12-05 NOTE — Patient Instructions (Addendum)
Hold you statin for 1-2 weeks and track how you feel and how blood glucose is  Here is some info on a new med (PCYK9 inhibitor)-they tend to be very expensive and getting financial aide is hard  Do some research   Stop at check out to schedule your mammogram and bone density test   Don't forget to schedule your eye exam   Don't forget to schedule your November colonoscopy   Flu shot today   Let's check a urine sample today since your white cell count is high

## 2016-12-05 NOTE — Assessment & Plan Note (Deleted)
Reviewed health habits including diet and exercise and skin cancer prevention Reviewed appropriate screening tests for age  Also reviewed health mt list, fam hx and immunization status , as well as social and family history   See HPI amw reviewed Labs reviewed  Stop at check out to schedule your mammogram and bone density test   Don't forget to schedule your eye exam   Don't forget to schedule your November colonoscopy   Flu shot today

## 2016-12-05 NOTE — Assessment & Plan Note (Signed)
Due for f/u colonoscopy in Nov 2018 due to polyps (1 y recall)

## 2016-12-05 NOTE — Progress Notes (Signed)
Subjective:    Patient ID: Donna Murphy, female    DOB: 06-06-1945, 71 y.o.   MRN: [id number]  HPI  Here for annual f/u of chronic medical problems   Doing well  Husband is doing better (has multiple myeloma) Went to [place] and out west   Using triamcinolone on rectal area occasionally  She tends to get diarrhea occ and skin gets irritated   Does not see gyn  Gets occ vaginal itching - does not think she has yeast  When traveling- better now    Had amw visit 8/27 Order was created for mammogram and dexa  Given ppv23   Takes vit D No falls or fractures  Mother had OP  Also has milk or yogurt daily    Tetanus shot 8/07 Will try to get one covered at the pharmacy   Flu shot -wants to get today   Eye exam 8/17   Mammogram 9/15 neg Self breast exam -no lumps   Has not had a bone density test   Colonoscopy 11/17 polyps Recall 2018 with Dr Carlean Purl She will schedule that    bp is stable today  No cp or palpitations or headaches or edema  No side effects to medicines  BP Readings from Last 3 Encounters:  12/05/16 122/76  11/27/16 118/72  02/17/16 120/65        Wt Readings from Last 3 Encounters:  12/05/16 135 lb 8 oz (61.5 kg)  11/27/16 133 lb 12 oz (60.7 kg)  02/17/16 146 lb (66.2 kg)  has lost weight from her new diabetes med and also traveling and walking a lot  Getting a lot of exercise  23.63 kg/m    Sees Dr Chalmers Cater for DM2 Last a1C was 6.1 - reassuring Glucose was 157 on our check  She hit the donut hole for [place] and [place] (over 300 $ per month)  Limited samples from Dr Chalmers Cater    Hyperlipidemia Lab Results  Component Value Date   CHOL 143 11/28/2016   CHOL 160 11/22/2015   CHOL 154 11/23/2014   Lab Results  Component Value Date   HDL 56.10 11/28/2016   HDL 54.30 11/22/2015   HDL 51.70 11/23/2014   Lab Results  Component Value Date   LDLCALC 63 11/28/2016   LDLCALC 80 11/22/2015   LDLCALC 77 11/23/2014   Lab Results    Component Value Date   TRIG 119.0 11/28/2016   TRIG 130.0 11/22/2015   TRIG 127.0 11/23/2014   Lab Results  Component Value Date   CHOLHDL 3 11/28/2016   CHOLHDL 3 11/22/2015   CHOLHDL 3 11/23/2014   No results found for: LDLDIRECT On atorvastatin 40 mg and diet  She knows statins raise blood sugar  Some aches and pains   Lab Results  Component Value Date   WBC 12.2 (H) 11/28/2016   HGB 14.1 11/28/2016   HCT 44.0 11/28/2016   MCV 89.7 11/28/2016   PLT 327.0 11/28/2016    No recent illness or stress Ears bother her a bit  Has a little low back pain but no dysuria  UA is clear Results for orders placed or performed in visit on 12/05/16  POCT Urinalysis Dipstick (Automated)  Result Value Ref Range   Color, UA Light Yellow    Clarity, UA Clear    Glucose, UA 1000 mg/dL    Bilirubin, UA Negative    Ketones, UA Negative    Spec Grav, UA 1.025 1.010 - 1.025   Blood, UA  Negative    pH, UA 6.0 5.0 - 8.0   Protein, UA Negative    Urobilinogen, UA 0.2 0.2 or 1.0 E.U./dL   Nitrite, UA Negative    Leukocytes, UA Negative Negative     Lab Results  Component Value Date   CREATININE 0.72 11/28/2016   BUN 21 11/28/2016   NA 137 11/28/2016   K 4.6 11/28/2016   CL 101 11/28/2016   CO2 30 11/28/2016    Lab Results  Component Value Date   ALT 23 11/28/2016   AST 17 11/28/2016   ALKPHOS 58 11/28/2016   BILITOT 0.7 11/28/2016    Lab Results  Component Value Date   TSH 1.20 11/28/2016       Review of Systems Review of Systems  Constitutional: Negative for fever, appetite change, fatigue and unexpected weight change.  Eyes: Negative for pain and visual disturbance.  Respiratory: Negative for cough and shortness of breath.   Cardiovascular: Negative for cp or palpitations    Gastrointestinal: Negative for nausea, diarrhea and constipation.  Genitourinary: Negative for urgency and frequency.  Skin: Negative for pallor or rash  pos for occ rectal irritation   Neurological: Negative for weakness, light-headedness, numbness and headaches.  Hematological: Negative for adenopathy. Does not bruise/bleed easily.  Psychiatric/Behavioral: Negative for dysphoric mood. The patient is not nervous/anxious.         Objective:   Physical Exam  Constitutional: She appears well-developed and well-nourished. No distress.  Well appearing   HENT:  Head: Normocephalic and atraumatic.  Right Ear: External ear normal.  Left Ear: External ear normal.  Mouth/Throat: Oropharynx is clear and moist.  Eyes: Pupils are equal, round, and reactive to light. Conjunctivae and EOM are normal. No scleral icterus.  Neck: Normal range of motion. Neck supple. No JVD present. Carotid bruit is not present. No thyromegaly present.  Cardiovascular: Normal rate, regular rhythm, normal heart sounds and intact distal pulses.  Exam reveals no gallop.   Some LE varicosities   Pulmonary/Chest: Effort normal and breath sounds normal. No respiratory distress. She has no wheezes. She exhibits no tenderness.  Abdominal: Soft. Bowel sounds are normal. She exhibits no distension, no abdominal bruit and no mass. There is no tenderness.  No suprapubic tenderness or fullness  No cva tenderness   Genitourinary: No breast swelling, tenderness, discharge or bleeding.  Genitourinary Comments: Breast exam: No mass, nodules, thickening, tenderness, bulging, retraction, inflamation, nipple discharge or skin changes noted.  No axillary or clavicular LA.      Musculoskeletal: Normal range of motion. She exhibits no edema or tenderness.  Lymphadenopathy:    She has no cervical adenopathy.  Neurological: She is alert. She has normal reflexes. No cranial nerve deficit. She exhibits normal muscle tone. Coordination normal.  Skin: Skin is warm and dry. No rash noted. No erythema. No pallor.  Solar lentigines diffusely   Psychiatric: She has a normal mood and affect.          Assessment & Plan:    Problem List Items Addressed This Visit      Cardiovascular and Mediastinum   Essential hypertension - Primary    bp in fair control at this time  BP Readings from Last 1 Encounters:  12/05/16 122/76   No changes needed Disc lifstyle change with low sodium diet and exercise  Labs reviewed        Relevant Medications   losartan (COZAAR) 50 MG tablet   atorvastatin (LIPITOR) 40 MG tablet  Endocrine   Diabetes mellitus type 2, uncontrolled ([place])    Sees Dr Chalmers Cater  4 medications  A1C 6.1  Doing well but some difficulty with finances in the donut hole May have to consider insulin in the future      Relevant Medications   losartan (COZAAR) 50 MG tablet   atorvastatin (LIPITOR) 40 MG tablet     Other   Colon cancer screening    Due for f/u colonoscopy in Nov 2018 due to polyps (1 y recall)      Elevated WBC count    Unsure of cause  UA is clear today  Re check 1 mo Watch for fever or other symptoms Nl exam      Relevant Orders   POCT Urinalysis Dipstick (Automated) (Completed)   Hx of colonic polyps    Pt will schedule her colonoscopy for nov (recall)      Hyperlipidemia    Disc goals for lipids and reasons to control them Rev labs with pt Rev low sat fat diet in detail Well controlled but pt may not be tolerating statin  Will hold 1-2 wk and report back  Doubt she could get financial asst with pcyk9 inhibitor - but info given so she can do some research      Relevant Medications   losartan (COZAAR) 50 MG tablet   atorvastatin (LIPITOR) 40 MG tablet    Other Visit Diagnoses    Need for influenza vaccination       Relevant Orders   Flu Vaccine QUAD 6+ mos PF IM (Fluarix Quad PF) (Completed)

## 2016-12-05 NOTE — Assessment & Plan Note (Signed)
bp in fair control at this time  BP Readings from Last 1 Encounters:  12/05/16 122/76   No changes needed Disc lifstyle change with low sodium diet and exercise  Labs reviewed

## 2016-12-28 ENCOUNTER — Other Ambulatory Visit: Payer: Self-pay | Admitting: Family Medicine

## 2016-12-28 DIAGNOSIS — D72829 Elevated white blood cell count, unspecified: Secondary | ICD-10-CM

## 2017-01-09 ENCOUNTER — Other Ambulatory Visit (INDEPENDENT_AMBULATORY_CARE_PROVIDER_SITE_OTHER): Payer: Medicare Other

## 2017-01-09 DIAGNOSIS — D72829 Elevated white blood cell count, unspecified: Secondary | ICD-10-CM | POA: Diagnosis not present

## 2017-01-09 LAB — CBC WITH DIFFERENTIAL/PLATELET
BASOS ABS: 0 10*3/uL (ref 0.0–0.1)
BASOS PCT: 0.6 % (ref 0.0–3.0)
EOS ABS: 0.1 10*3/uL (ref 0.0–0.7)
Eosinophils Relative: 1.7 % (ref 0.0–5.0)
HEMATOCRIT: 42.1 % (ref 36.0–46.0)
Hemoglobin: 13.7 g/dL (ref 12.0–15.0)
LYMPHS ABS: 2.4 10*3/uL (ref 0.7–4.0)
LYMPHS PCT: 36.9 % (ref 12.0–46.0)
MCHC: 32.5 g/dL (ref 30.0–36.0)
MCV: 90.2 fl (ref 78.0–100.0)
Monocytes Absolute: 0.4 10*3/uL (ref 0.1–1.0)
Monocytes Relative: 6 % (ref 3.0–12.0)
NEUTROS ABS: 3.6 10*3/uL (ref 1.4–7.7)
NEUTROS PCT: 54.8 % (ref 43.0–77.0)
PLATELETS: 323 10*3/uL (ref 150.0–400.0)
RBC: 4.67 Mil/uL (ref 3.87–5.11)
RDW: 13.5 % (ref 11.5–15.5)
WBC: 6.6 10*3/uL (ref 4.0–10.5)

## 2017-01-10 ENCOUNTER — Encounter: Payer: Self-pay | Admitting: *Deleted

## 2017-01-11 ENCOUNTER — Other Ambulatory Visit: Payer: Medicare Other

## 2017-01-31 ENCOUNTER — Other Ambulatory Visit: Payer: Medicare Other

## 2017-03-20 ENCOUNTER — Ambulatory Visit
Admission: RE | Admit: 2017-03-20 | Discharge: 2017-03-20 | Disposition: A | Discharge status: Home / Self Care | Payer: Medicare Other | Source: Ambulatory Visit | Attending: Family Medicine | Admitting: Family Medicine

## 2017-03-20 DIAGNOSIS — Z1231 Encounter for screening mammogram for malignant neoplasm of breast: Secondary | ICD-10-CM | POA: Diagnosis not present

## 2017-03-20 DIAGNOSIS — Z780 Asymptomatic menopausal state: Secondary | ICD-10-CM | POA: Diagnosis not present

## 2017-03-20 DIAGNOSIS — Z1239 Encounter for other screening for malignant neoplasm of breast: Secondary | ICD-10-CM

## 2017-03-20 DIAGNOSIS — E2839 Other primary ovarian failure: Secondary | ICD-10-CM | POA: Insufficient documentation

## 2017-03-20 DIAGNOSIS — Z13820 Encounter for screening for osteoporosis: Secondary | ICD-10-CM | POA: Diagnosis not present

## 2017-03-29 ENCOUNTER — Encounter: Payer: Self-pay | Admitting: *Deleted

## 2017-03-30 ENCOUNTER — Encounter: Payer: Self-pay | Admitting: Internal Medicine

## 2017-05-23 ENCOUNTER — Ambulatory Visit (INDEPENDENT_AMBULATORY_CARE_PROVIDER_SITE_OTHER): Payer: Medicare Other | Admitting: Family Medicine

## 2017-05-23 ENCOUNTER — Encounter: Payer: Self-pay | Admitting: Family Medicine

## 2017-05-23 VITALS — BP 122/80 | HR 79 | Temp 98.1°F | Wt 139.0 lb

## 2017-05-23 DIAGNOSIS — J019 Acute sinusitis, unspecified: Secondary | ICD-10-CM | POA: Insufficient documentation

## 2017-05-23 DIAGNOSIS — J0110 Acute frontal sinusitis, unspecified: Secondary | ICD-10-CM | POA: Diagnosis not present

## 2017-05-23 DIAGNOSIS — E1165 Type 2 diabetes mellitus with hyperglycemia: Secondary | ICD-10-CM

## 2017-05-23 DIAGNOSIS — Z860100 Personal history of colon polyps, unspecified: Secondary | ICD-10-CM

## 2017-05-23 DIAGNOSIS — IMO0001 Reserved for inherently not codable concepts without codable children: Secondary | ICD-10-CM

## 2017-05-23 MED ORDER — BENZONATATE 200 MG PO CAPS: 200.0000 mg | ORAL_CAPSULE | Freq: Three times a day (TID) | ORAL | 1 refills | Status: DC | PRN

## 2017-05-23 MED ORDER — GUAIFENESIN-CODEINE 100-10 MG/5ML PO SYRP: 5.0000 mL | ORAL_SOLUTION | Freq: Every evening | ORAL | 0 refills | Status: DC | PRN

## 2017-05-23 MED ORDER — AMOXICILLIN-POT CLAVULANATE 875-125 MG PO TABS: 1.0000 | ORAL_TABLET | Freq: Two times a day (BID) | ORAL | 0 refills | Status: DC

## 2017-05-23 NOTE — Patient Instructions (Signed)
Drink lots of fluids  Try to get some rest  Take the augmentin for sinus infection  Try tessalon for cough  The guifen-codeine for bedtime for cough   Continue saline/netti pot  Breathing steam helps also   Update if not starting to improve in a week or if worsening

## 2017-05-23 NOTE — Assessment & Plan Note (Signed)
After 2 wk of uri  With persistent cough  Cover with augmentin  Tessalon and guif-cod for cough  Fluids/rest /supp care Update if not starting to improve in a week or if worsening    Meds ordered this encounter  Medications  . amoxicillin-clavulanate (AUGMENTIN) 875-125 MG tablet    Sig: Take 1 tablet by mouth 2 (two) times daily.    Dispense:  14 tablet    Refill:  0  . benzonatate (TESSALON) 200 MG capsule    Sig: Take 1 capsule (200 mg total) by mouth 3 (three) times daily as needed for cough. Swallow whole, do not bite pill    Dispense:  30 capsule    Refill:  1  . guaiFENesin-codeine (ROBITUSSIN AC) 100-10 MG/5ML syrup    Sig: Take 5-10 mLs by mouth at bedtime as needed for cough.    Dispense:  100 mL    Refill:  0

## 2017-05-23 NOTE — Progress Notes (Signed)
Subjective:    Patient ID: Donna Murphy, female    DOB: 09-07-1945, 72 y.o.   MRN: [id number]  HPI Here for uri symptoms   Started with nasal congestion for 2 weeks (clear d/c) with ST  Over the weekend at the beach -became worse Cough -occ prod of yellow sputum (much worse all night and day today) - made her chest hurt  May be from pnd  ST Headache comes and goes   Some facial pain and pressure -occ green nasal drainage  Eyes water  Ears hurt off and on    otc chlorcedin  netti pot Saline  Salt water gargles  Ibuprofen helps  occ nyquil at night    No fever  Temp: 98.1 F (36.7 C)    Patient Active Problem List   Diagnosis Date Noted  . Acute sinusitis 05/23/2017  . Elevated WBC count 12/05/2016  . Hx of colonic polyps 03/01/2016  . Colon cancer screening 11/23/2015  . Encounter for Medicare annual wellness exam 11/12/2013  . Other screening mammogram 05/24/2011  . Gynecological examination 05/24/2011  . Diabetes mellitus type 2, uncontrolled ([place]) 12/04/2006  . Hyperlipidemia 07/12/2006  . Essential hypertension 07/12/2006  . VARICOSE VEIN 07/12/2006  . ALLERGIC RHINITIS 07/12/2006  . GERD 07/12/2006  . IRRITABLE BOWEL SYNDROME 07/12/2006   Past Medical History:  Diagnosis Date  . Allergic rhinitis   . Allergy   . DMII (diabetes mellitus, type 2) ([place])   . Fibroids    uterine  . GERD (gastroesophageal reflux disease)   . HLD (hyperlipidemia)   . HTN (hypertension)   . Hx of colonic polyps 03/01/2016  . Irritable bowel syndrome   . Kidney stone    passed stone-no surgery required  . Skin cancer of lip    basal cell  . Varices of other sites    Past Surgical History:  Procedure Laterality Date  . COLONOSCOPY  9/07   negative - [facility]ner  . ENDOMETRIAL BIOPSY  10/01   negative  . TONSILLECTOMY    . TOOTH EXTRACTION     Social History   Tobacco Use  . Smoking status: Never Smoker  . Smokeless tobacco: Never Used  Substance Use Topics  .  Alcohol use: Yes    Alcohol/week: 4.2 - 6.0 oz    Types: 7 - 10 Glasses of wine per week  . Drug use: No   Family History  Problem Relation Age of Onset  . Coronary artery disease Mother   . Hyperlipidemia Mother   . Hypertension Mother   . Osteoporosis Mother   . Heart attack Mother        CABG in her 24's  . Diabetes Father   . Heart failure Father   . Hypertension Father   . Transient ischemic attack Father   . Diabetes Unknown        GF  . Hypertension Brother   . Heart murmur Son   . Brain cancer Sister   . Colon cancer Neg Hx   . Esophageal cancer Neg Hx   . Stomach cancer Neg Hx    Allergies  Allergen Reactions  . Ciprofloxacin     Arm numbness  . Sulfonamide Derivatives     Hives, rash   Current Outpatient Medications on File Prior to Visit  Medication Sig Dispense Refill  . aspirin 81 MG tablet Take 81 mg by mouth daily.      [name] atorvastatin (LIPITOR) 40 MG tablet Take 1 tablet (40 mg  total) by mouth daily. 90 tablet 3  . Cholecalciferol (VITAMIN D) 2000 UNITS tablet Take 2,000 Units by mouth daily.    . Cyanocobalamin (B-12 PO) Take by mouth.    [name] glimepiride (AMARYL) 4 MG tablet Take 4 mg by mouth 2 (two) times daily with a meal.    . glucose blood (ONETOUCH VERIO) test strip Ck blood sugar once daily and as directed. Dx E11.9 100 each 1  . JANUVIA 100 MG tablet TAKE 1 TABLET BY MOUTH ONCE DAILY FOR 30 DAYS  5  . Krill Oil 300 MG CAPS Take 1 capsule by mouth daily.    [name] loratadine (CLARITIN) 10 MG tablet Take 10 mg by mouth daily as needed.     [name] losartan (COZAAR) 50 MG tablet Take 1 tablet (50 mg total) by mouth daily. 90 tablet 3  . metFORMIN (GLUCOPHAGE) 500 MG tablet Take 1,000 mg by mouth 2 (two) times daily with a meal.    . Multiple Vitamin (MULTIVITAMIN) tablet Take 1 tablet by mouth daily.      [name] omeprazole (PRILOSEC) 20 MG capsule Take 1 capsule (20 mg total) by mouth daily. 90 capsule 3  . triamcinolone (KENALOG) 0.025 % cream Apply topically daily  as needed. Apply to affected areas once daily as needed 30 g 1  . Triamcinolone Acetonide (NASACORT ALLERGY 24HR NA) Place 1 spray into the nose daily as needed.     . triamcinolone cream (KENALOG) 0.1 % Apply 1 application topically 2 (two) times daily. 30 g 0  . FARXIGA 10 MG TABS tablet Take 10 mg by mouth daily.  6   Current Facility-Administered Medications on File Prior to Visit  Medication Dose Route Frequency Provider Last Rate Last Dose  . 0.9 %  sodium chloride infusion  500 mL Intravenous Continuous Gatha Mayer, MD        Review of Systems  Constitutional: Positive for appetite change. Negative for fatigue and fever.  HENT: Positive for congestion, ear pain, postnasal drip, rhinorrhea, sinus pressure and sore throat. Negative for nosebleeds.   Eyes: Negative for pain, redness and itching.  Respiratory: Positive for cough. Negative for shortness of breath and wheezing.   Cardiovascular: Negative for chest pain.  Gastrointestinal: Negative for abdominal pain, diarrhea, nausea and vomiting.  Endocrine: Negative for polyuria.  Genitourinary: Negative for dysuria, frequency and urgency.  Musculoskeletal: Negative for arthralgias and myalgias.  Allergic/Immunologic: Negative for immunocompromised state.  Neurological: Positive for headaches. Negative for dizziness, tremors, syncope, weakness and numbness.  Hematological: Negative for adenopathy. Does not bruise/bleed easily.  Psychiatric/Behavioral: Negative for dysphoric mood. The patient is not nervous/anxious.        Objective:   Physical Exam  Constitutional: She appears well-developed and well-nourished. No distress.  HENT:  Head: Normocephalic and atraumatic.  Right Ear: External ear normal.  Left Ear: External ear normal.  Mouth/Throat: Oropharynx is clear and moist. No oropharyngeal exudate.  Nares are injected and congested  Bilateral maxillary sinus tenderness  Post nasal drip   Eyes: Conjunctivae and EOM are  normal. Pupils are equal, round, and reactive to light. Right eye exhibits no discharge. Left eye exhibits no discharge.  Neck: Normal range of motion. Neck supple.  Cardiovascular: Normal rate and regular rhythm.  Pulmonary/Chest: Effort normal and breath sounds normal. No respiratory distress. She has no wheezes. She has no rales.  Lymphadenopathy:    She has no cervical adenopathy.  Neurological: She is alert. No cranial nerve deficit.  Skin: Skin is  warm and dry. No rash noted.  Psychiatric: She has a normal mood and affect.          Assessment & Plan:   Problem List Items Addressed This Visit      Respiratory   Acute sinusitis    After 2 wk of uri  With persistent cough  Cover with augmentin  Tessalon and guif-cod for cough  Fluids/rest /supp care Update if not starting to improve in a week or if worsening    Meds ordered this encounter  Medications  . amoxicillin-clavulanate (AUGMENTIN) 875-125 MG tablet    Sig: Take 1 tablet by mouth 2 (two) times daily.    Dispense:  14 tablet    Refill:  0  . benzonatate (TESSALON) 200 MG capsule    Sig: Take 1 capsule (200 mg total) by mouth 3 (three) times daily as needed for cough. Swallow whole, do not bite pill    Dispense:  30 capsule    Refill:  1  . guaiFENesin-codeine (ROBITUSSIN AC) 100-10 MG/5ML syrup    Sig: Take 5-10 mLs by mouth at bedtime as needed for cough.    Dispense:  100 mL    Refill:  0         Relevant Medications   amoxicillin-clavulanate (AUGMENTIN) 875-125 MG tablet   benzonatate (TESSALON) 200 MG capsule   guaiFENesin-codeine (ROBITUSSIN AC) 100-10 MG/5ML syrup

## 2017-05-24 DIAGNOSIS — Z86010 Personal history of colonic polyps: Secondary | ICD-10-CM | POA: Insufficient documentation

## 2017-05-24 NOTE — Assessment & Plan Note (Signed)
Continues to see Dr Chalmers Cater

## 2017-05-28 DIAGNOSIS — E7800 Pure hypercholesterolemia, unspecified: Secondary | ICD-10-CM | POA: Diagnosis not present

## 2017-05-28 DIAGNOSIS — I10 Essential (primary) hypertension: Secondary | ICD-10-CM | POA: Diagnosis not present

## 2017-05-28 DIAGNOSIS — E1165 Type 2 diabetes mellitus with hyperglycemia: Secondary | ICD-10-CM | POA: Diagnosis not present

## 2017-05-28 LAB — HEMOGLOBIN A1C: Hemoglobin A1C: 6.7

## 2017-06-07 ENCOUNTER — Encounter: Payer: Self-pay | Admitting: Endocrinology

## 2017-11-26 DIAGNOSIS — I10 Essential (primary) hypertension: Secondary | ICD-10-CM | POA: Diagnosis not present

## 2017-11-26 DIAGNOSIS — E7800 Pure hypercholesterolemia, unspecified: Secondary | ICD-10-CM | POA: Diagnosis not present

## 2017-11-26 DIAGNOSIS — E1165 Type 2 diabetes mellitus with hyperglycemia: Secondary | ICD-10-CM | POA: Diagnosis not present

## 2017-11-28 ENCOUNTER — Ambulatory Visit: Payer: Medicare Other

## 2017-12-10 ENCOUNTER — Encounter: Payer: Medicare Other | Admitting: Family Medicine

## 2017-12-20 ENCOUNTER — Telehealth: Payer: Self-pay | Admitting: Family Medicine

## 2017-12-20 DIAGNOSIS — I10 Essential (primary) hypertension: Secondary | ICD-10-CM

## 2017-12-20 DIAGNOSIS — E7800 Pure hypercholesterolemia, unspecified: Secondary | ICD-10-CM

## 2017-12-20 NOTE — Telephone Encounter (Signed)
-----   Message from Lendon Collar, RT sent at 12/17/2017  9:00 AM EDT ----- Regarding: Lab orders for Tuesday 12/25/17 Please enter CPE lab orders for 12/25/17. Thanks!

## 2017-12-25 ENCOUNTER — Other Ambulatory Visit (INDEPENDENT_AMBULATORY_CARE_PROVIDER_SITE_OTHER): Payer: Medicare Other

## 2017-12-25 DIAGNOSIS — E7800 Pure hypercholesterolemia, unspecified: Secondary | ICD-10-CM

## 2017-12-25 DIAGNOSIS — I10 Essential (primary) hypertension: Secondary | ICD-10-CM | POA: Diagnosis not present

## 2017-12-25 LAB — COMPREHENSIVE METABOLIC PANEL
ALBUMIN: 4.4 g/dL (ref 3.5–5.2)
ALT: 23 U/L (ref 0–35)
AST: 17 U/L (ref 0–37)
Alkaline Phosphatase: 58 U/L (ref 39–117)
BUN: 21 mg/dL (ref 6–23)
CHLORIDE: 104 meq/L (ref 96–112)
CO2: 27 mEq/L (ref 19–32)
Calcium: 9.7 mg/dL (ref 8.4–10.5)
Creatinine, Ser: 0.71 mg/dL (ref 0.40–1.20)
GFR: 86.23 mL/min (ref >60.00)
Glucose, Bld: 158 mg/dL — ABNORMAL HIGH (ref 70–99)
Potassium: 4.4 mEq/L (ref 3.5–5.1)
SODIUM: 140 meq/L (ref 135–145)
TOTAL PROTEIN: 7.5 g/dL (ref 6.0–8.3)
Total Bilirubin: 0.5 mg/dL (ref 0.2–1.2)

## 2017-12-25 LAB — LIPID PANEL
Cholesterol: 159 mg/dL (ref 0–200)
HDL: 57.6 mg/dL
LDL Cholesterol: 82 mg/dL (ref 0–99)
NonHDL: 101.75
Total CHOL/HDL Ratio: 3
Triglycerides: 98 mg/dL (ref 0.0–149.0)
VLDL: 19.6 mg/dL (ref 0.0–40.0)

## 2017-12-25 LAB — CBC WITH DIFFERENTIAL/PLATELET
Basophils Absolute: 0.1 K/uL (ref 0.0–0.1)
Basophils Relative: 0.8 % (ref 0.0–3.0)
Eosinophils Absolute: 0.1 K/uL (ref 0.0–0.7)
Eosinophils Relative: 1.9 % (ref 0.0–5.0)
HCT: 44.6 % (ref 36.0–46.0)
Hemoglobin: 14.9 g/dL (ref 12.0–15.0)
Lymphocytes Relative: 38.1 % (ref 12.0–46.0)
Lymphs Abs: 2.8 K/uL (ref 0.7–4.0)
MCHC: 33.4 g/dL (ref 30.0–36.0)
MCV: 88.2 fl (ref 78.0–100.0)
Monocytes Absolute: 0.4 K/uL (ref 0.1–1.0)
Monocytes Relative: 5.4 % (ref 3.0–12.0)
Neutro Abs: 3.9 K/uL (ref 1.4–7.7)
Neutrophils Relative %: 53.8 % (ref 43.0–77.0)
Platelets: 360 K/uL (ref 150.0–400.0)
RBC: 5.05 Mil/uL (ref 3.87–5.11)
RDW: 13.4 % (ref 11.5–15.5)
WBC: 7.3 K/uL (ref 4.0–10.5)

## 2017-12-25 LAB — TSH: TSH: 1.48 u[IU]/mL (ref 0.35–4.50)

## 2018-01-01 ENCOUNTER — Encounter: Payer: Self-pay | Admitting: Family Medicine

## 2018-01-01 ENCOUNTER — Ambulatory Visit (INDEPENDENT_AMBULATORY_CARE_PROVIDER_SITE_OTHER): Payer: Medicare Other | Admitting: Family Medicine

## 2018-01-01 ENCOUNTER — Encounter (INDEPENDENT_AMBULATORY_CARE_PROVIDER_SITE_OTHER): Payer: Self-pay

## 2018-01-01 ENCOUNTER — Encounter: Payer: Self-pay | Admitting: Internal Medicine

## 2018-01-01 VITALS — BP 130/80 | HR 84 | Temp 98.5°F | Ht 63.25 in | Wt 136.5 lb

## 2018-01-01 DIAGNOSIS — Z1211 Encounter for screening for malignant neoplasm of colon: Secondary | ICD-10-CM | POA: Diagnosis not present

## 2018-01-01 DIAGNOSIS — E1165 Type 2 diabetes mellitus with hyperglycemia: Secondary | ICD-10-CM | POA: Diagnosis not present

## 2018-01-01 DIAGNOSIS — Z86010 Personal history of colonic polyps: Secondary | ICD-10-CM | POA: Diagnosis not present

## 2018-01-01 DIAGNOSIS — Z23 Encounter for immunization: Secondary | ICD-10-CM

## 2018-01-01 DIAGNOSIS — Z0000 Encounter for general adult medical examination without abnormal findings: Secondary | ICD-10-CM | POA: Diagnosis not present

## 2018-01-01 DIAGNOSIS — I10 Essential (primary) hypertension: Secondary | ICD-10-CM

## 2018-01-01 DIAGNOSIS — E7800 Pure hypercholesterolemia, unspecified: Secondary | ICD-10-CM

## 2018-01-01 MED ORDER — TRIAMCINOLONE ACETONIDE 0.1 % EX CREA: 1.0000 "application " | TOPICAL_CREAM | Freq: Two times a day (BID) | CUTANEOUS | 1 refills | Status: DC

## 2018-01-01 MED ORDER — OMEPRAZOLE 20 MG PO CPDR: 20.0000 mg | DELAYED_RELEASE_CAPSULE | Freq: Every day | ORAL | 3 refills | Status: DC

## 2018-01-01 MED ORDER — ATORVASTATIN CALCIUM 40 MG PO TABS: 40.0000 mg | ORAL_TABLET | Freq: Every day | ORAL | 3 refills | Status: DC

## 2018-01-01 MED ORDER — LOSARTAN POTASSIUM 50 MG PO TABS: 50.0000 mg | ORAL_TABLET | Freq: Every day | ORAL | 3 refills | Status: DC

## 2018-01-01 NOTE — Assessment & Plan Note (Signed)
Sees Dr Chalmers Cater and doing well  Arb for renal protection Last a1C is 6.4  Pt plans to set up eye exam soon  Enc further healthy diet and exercise

## 2018-01-01 NOTE — Patient Instructions (Addendum)
Don't forget to set up your eye exam  Please have them send [place] a copy or medicare will not be updated   Get your [facility]anus shot at [facility] -- and have them send [place] a note also   Bring a copy of your adv directive the next time you are here and we will scan a copy   Keep taking good care of yourself

## 2018-01-01 NOTE — Assessment & Plan Note (Signed)
Reviewed health habits including diet and exercise and skin cancer prevention Reviewed appropriate screening tests for age  Also reviewed health mt list, fam hx and immunization status , as well as social and family history   See HPI Labs reviewed  Pt will set up eye exam  Ref done for colonoscopy  Asked pt to bring copy of adv directive for scanning  Flu shot given today  She plans to get tetanus booster at [facility] Good health habits

## 2018-01-01 NOTE — Assessment & Plan Note (Signed)
Overdue for 1 y recall colonoscopy for polyps  Will refer for this

## 2018-01-01 NOTE — Assessment & Plan Note (Signed)
Due for 1 y recall - see overview (tubular adenoma and high grade dysplasia)

## 2018-01-01 NOTE — Assessment & Plan Note (Signed)
Disc goals for lipids and reasons to control them Rev last labs with pt Rev low sat fat diet in detail LDL up slightly  On atorvastain If affordable-she is interested in trying crestor  She will let us know

## 2018-01-01 NOTE — Progress Notes (Signed)
Subjective:    Patient ID: Donna Murphy, female    DOB: Jun 18, 1945, 72 y.o.   MRN: [id number]  HPI Here for medicare wellness exam and f/u of chronic health problems   I have personally reviewed the Medicare Annual Wellness questionnaire and have noted 1. The patient's medical and social history 2. Their use of alcohol, tobacco or illicit drugs 3. Their current medications and supplements 4. The patient's functional ability including ADL's, fall risks, home safety risks and hearing or visual             impairment. 5. Diet and physical activities 6. Evidence for depression or mood disorders  The patients weight, height, BMI have been recorded in the chart and visual acuity is per eye clinic.  I have made referrals, counseling and provided education to the patient based review of the above and I have provided the pt with a written personalized care plan for preventive services. Reviewed and updated provider list, see scanned forms.  See scanned forms.  Routine anticipatory guidance given to patient.  See health maintenance. Colon cancer screening colonoscopy 11/17 with 1 y recall (adenoma) Breast cancer screening  Mammogram 12/18  Self breast exam-no lumps  Flu vaccine-given today  Tetanus vaccine 8/07- [facility] Pneumovax- up to date  Zoster vaccine-zostavax 12/14 Bone density   dexa 8/18 - BMD in normal range  She takes vit D  (no calcium due to kidney stones)  Advance directive- still has living will and POA (her husband Natahsa Marian)  Cognitive function addressed- see scanned forms- and if abnormal then additional documentation follows. (no concerns above or beyond name recognition) - her mother had dementia / knows the signs   PMH and SH reviewed  Meds, vitals, and allergies reviewed.   ROS: See HPI.  Otherwise negative.     Hearing Screening   125Hz  250Hz  500Hz  1000Hz  2000Hz  3000Hz  4000Hz  6000Hz  8000Hz   Right ear:   40 40 40  40    Left ear:   40 40 40  40      Visual Acuity Screening   Right eye Left eye Both eyes  Without correction:     With correction: 20/25 20/25 20/25    Needs diabetic eye exam   Wt Readings from Last 3 Encounters:  01/01/18 136 lb 8 oz (61.9 kg)  05/23/17 139 lb (63 kg)  12/05/16 135 lb 8 oz (61.5 kg)  wt is down 3 lb  Exercise - walks and works in the yard - plan is to start an exercise program more formally (wants some muscle)  23.99 kg/m    bp is stable today  No cp or palpitations or headaches or edema  No side effects to medicines  BP Readings from Last 3 Encounters:  01/01/18 (!) 142/74  05/23/17 122/80  12/05/16 122/76      DM2 Sees Dr Waneta Martins for renal protection  microalb was slt elevated A1C 6.4 in aug  Due for eye exam  She is planning eye exam- overdue  Has to use caution with farxiga- dizziness if she works outside   Hyperlipidemia Lab Results  Component Value Date   CHOL 159 12/25/2017   CHOL 143 11/28/2016   CHOL 160 11/22/2015   Lab Results  Component Value Date   HDL 57.60 12/25/2017   HDL 56.10 11/28/2016   HDL 54.30 11/22/2015   Lab Results  Component Value Date   LDLCALC 82 12/25/2017   [place] 63 11/28/2016   [place]  80 11/22/2015   Lab Results  Component Value Date   TRIG 98.0 12/25/2017   TRIG 119.0 11/28/2016   TRIG 130.0 11/22/2015   Lab Results  Component Value Date   CHOLHDL 3 12/25/2017   CHOLHDL 3 11/28/2016   CHOLHDL 3 11/22/2015   No results found for: LDLDIRECT On statin and diet , atorvastatin  Eats well overall  Eats more meat than she should   Lab Results  Component Value Date   CREATININE 0.71 12/25/2017   BUN 21 12/25/2017   NA 140 12/25/2017   K 4.4 12/25/2017   CL 104 12/25/2017   CO2 27 12/25/2017   Lab Results  Component Value Date   WBC 7.3 12/25/2017   HGB 14.9 12/25/2017   HCT 44.6 12/25/2017   MCV 88.2 12/25/2017   PLT 360.0 12/25/2017   Lab Results  Component Value Date   ALT 23 12/25/2017   AST 17 12/25/2017    ALKPHOS 58 12/25/2017   BILITOT 0.5 12/25/2017   Lab Results  Component Value Date   TSH 1.48 12/25/2017    Patient Active Problem List   Diagnosis Date Noted  . History of colon polyps 05/24/2017  . Hx of colonic polyps 03/01/2016  . Colon cancer screening 11/23/2015  . Encounter for Medicare annual wellness exam 11/12/2013  . Other screening mammogram 05/24/2011  . Gynecological examination 05/24/2011  . Diabetes mellitus type 2, uncontrolled ([place]) 12/04/2006  . Hyperlipidemia 07/12/2006  . Essential hypertension 07/12/2006  . VARICOSE VEIN 07/12/2006  . ALLERGIC RHINITIS 07/12/2006  . GERD 07/12/2006  . IRRITABLE BOWEL SYNDROME 07/12/2006   Past Medical History:  Diagnosis Date  . Allergic rhinitis   . Allergy   . DMII (diabetes mellitus, type 2) ([place])   . Fibroids    uterine  . GERD (gastroesophageal reflux disease)   . HLD (hyperlipidemia)   . HTN (hypertension)   . Hx of colonic polyps 03/01/2016  . Irritable bowel syndrome   . Kidney stone    passed stone-no surgery required  . Skin cancer of lip    basal cell  . Varices of other sites    Past Surgical History:  Procedure Laterality Date  . COLONOSCOPY  9/07   negative - gessner  . ENDOMETRIAL BIOPSY  10/01   negative  . TONSILLECTOMY    . TOOTH EXTRACTION     Social History   Tobacco Use  . Smoking status: Never Smoker  . Smokeless tobacco: Never Used  Substance Use Topics  . Alcohol use: Yes    Alcohol/week: 7.0 - 10.0 standard drinks    Types: 7 - 10 Glasses of wine per week    Comment: occ  . Drug use: No   Family History  Problem Relation Age of Onset  . Coronary artery disease Mother   . Hyperlipidemia Mother   . Hypertension Mother   . Osteoporosis Mother   . Heart attack Mother        CABG in her 31's  . Diabetes Father   . Heart failure Father   . Hypertension Father   . Transient ischemic attack Father   . Diabetes Unknown        GF  . Hypertension Brother   . Heart murmur  Son   . Brain cancer Sister   . Colon cancer Neg Hx   . Esophageal cancer Neg Hx   . Stomach cancer Neg Hx    Allergies  Allergen Reactions  . Ciprofloxacin  Arm numbness  . Sulfonamide Derivatives     Hives, rash   Current Outpatient Medications on File Prior to Visit  Medication Sig Dispense Refill  . aspirin 81 MG tablet Take 81 mg by mouth daily.      . Cholecalciferol (VITAMIN D) 2000 UNITS tablet Take 2,000 Units by mouth daily.    . Cyanocobalamin (B-12 PO) Take by mouth.    [name] FARXIGA 10 MG TABS tablet Take 10 mg by mouth daily.  6  . glimepiride (AMARYL) 4 MG tablet Take 4 mg by mouth 2 (two) times daily with a meal.    . glucose blood (ONETOUCH VERIO) test strip Ck blood sugar once daily and as directed. Dx E11.9 100 each 1  . JANUVIA 100 MG tablet TAKE 1 TABLET BY MOUTH ONCE DAILY FOR 30 DAYS  5  . Krill Oil 300 MG CAPS Take 1 capsule by mouth daily.    [name] loratadine (CLARITIN) 10 MG tablet Take 10 mg by mouth daily as needed.     . metFORMIN (GLUCOPHAGE) 500 MG tablet Take 1,000 mg by mouth 2 (two) times daily with a meal.    . Multiple Vitamin (MULTIVITAMIN) tablet Take 1 tablet by mouth daily.       No current facility-administered medications on file prior to visit.     Review of Systems  Constitutional: Negative for activity change, appetite change, fatigue, fever and unexpected weight change.  HENT: Negative for congestion, ear pain, rhinorrhea, sinus pressure and sore throat.   Eyes: Negative for pain, redness and visual disturbance.  Respiratory: Negative for cough, shortness of breath and wheezing.   Cardiovascular: Negative for chest pain and palpitations.  Gastrointestinal: Negative for abdominal pain, blood in stool, constipation and diarrhea.  Endocrine: Negative for polydipsia and polyuria.  Genitourinary: Negative for dysuria, frequency and urgency.  Musculoskeletal: Negative for arthralgias, back pain and myalgias.  Skin: Negative for pallor and  rash.  Allergic/Immunologic: Negative for environmental allergies.  Neurological: Negative for dizziness, syncope and headaches.  Hematological: Negative for adenopathy. Does not bruise/bleed easily.  Psychiatric/Behavioral: Negative for decreased concentration and dysphoric mood. The patient is not nervous/anxious.        Objective:   Physical Exam  Constitutional: She appears well-developed and well-nourished. No distress.  Well appearing   HENT:  Head: Normocephalic and atraumatic.  Right Ear: External ear normal.  Left Ear: External ear normal.  Mouth/Throat: Oropharynx is clear and moist.  Eyes: Pupils are equal, round, and reactive to light. Conjunctivae and EOM are normal. No scleral icterus.  Neck: Normal range of motion. Neck supple. No JVD present. Carotid bruit is not present. No thyromegaly present.  Cardiovascular: Normal rate, regular rhythm, normal heart sounds and intact distal pulses. Exam reveals no gallop.  Few LE varicosities   Pulmonary/Chest: Effort normal and breath sounds normal. No respiratory distress. She has no wheezes. She has no rales. She exhibits no tenderness. No breast tenderness, discharge or bleeding.  Abdominal: Soft. Bowel sounds are normal. She exhibits no distension, no abdominal bruit and no mass. There is no tenderness.  Genitourinary: No breast tenderness, discharge or bleeding.  Genitourinary Comments: Breast exam: No mass, nodules, thickening, tenderness, bulging, retraction, inflamation, nipple discharge or skin changes noted.  No axillary or clavicular LA.      Musculoskeletal: Normal range of motion. She exhibits no edema, tenderness or deformity.  Lymphadenopathy:    She has no cervical adenopathy.  Neurological: She is alert. She has normal reflexes. She displays  normal reflexes. No cranial nerve deficit. She exhibits normal muscle tone. Coordination normal.  Skin: Skin is warm and dry. No rash noted. No erythema. No pallor.  Solar  lentigines diffusely   Psychiatric: She has a normal mood and affect.  Pleasant Good mood           Assessment & Plan:   Problem List Items Addressed This Visit      Cardiovascular and Mediastinum   Essential hypertension    bp in fair control at this time  BP Readings from Last 1 Encounters:  01/01/18 130/80   No changes needed Most recent labs reviewed  Disc lifstyle change with low sodium diet and exercise        Relevant Medications   atorvastatin (LIPITOR) 40 MG tablet   losartan (COZAAR) 50 MG tablet     Endocrine   Diabetes mellitus type 2, uncontrolled ([facility])    Sees Dr Chalmers Cater and doing well  Arb for renal protection Last a1C is 6.4  Pt plans to set up eye exam soon  Enc further healthy diet and exercise       Relevant Medications   atorvastatin (LIPITOR) 40 MG tablet   losartan (COZAAR) 50 MG tablet     Other   Colon cancer screening    Overdue for 1 y recall colonoscopy for polyps  Will refer for this       Encounter for Medicare annual wellness exam - Primary    Reviewed health habits including diet and exercise and skin cancer prevention Reviewed appropriate screening tests for age  Also reviewed health mt list, fam hx and immunization status , as well as social and family history   See HPI Labs reviewed  Pt will set up eye exam  Ref done for colonoscopy  Asked pt to bring copy of adv directive for scanning  Flu shot given today  [facility] Good health habits       Hx of colonic polyps    Due for 1 y recall - see overview (tubular adenoma and high grade dysplasia)      Relevant Orders   Ambulatory referral to Gastroenterology   Hyperlipidemia    Disc goals for lipids and reasons to control them Rev last labs with pt Rev low sat fat diet in detail LDL up slightly  On atorvastain If affordable-she is interested in trying crestor  She will let us know       Relevant Medications   atorvastatin (LIPITOR)  40 MG tablet   losartan (COZAAR) 50 MG tablet    Other Visit Diagnoses    Need for influenza vaccination       Relevant Orders   Flu Vaccine QUAD 6+ mos PF IM (Fluarix Quad PF) (Completed)

## 2018-01-01 NOTE — Assessment & Plan Note (Signed)
bp in fair control at this time  BP Readings from Last 1 Encounters:  01/01/18 130/80   No changes needed Most recent labs reviewed  Disc lifstyle change with low sodium diet and exercise

## 2018-01-02 ENCOUNTER — Encounter: Payer: Self-pay | Admitting: Internal Medicine

## 2018-01-03 ENCOUNTER — Telehealth: Payer: Self-pay | Admitting: *Deleted

## 2018-01-03 MED ORDER — LOSARTAN POTASSIUM 25 MG PO TABS: 50.0000 mg | ORAL_TABLET | Freq: Every day | ORAL | 3 refills | Status: DC

## 2018-01-03 NOTE — Telephone Encounter (Signed)
Copied from [place] [id number]. Topic: General - Other >> Jan 03, 2018 11:48 AM Carolyn Stare wrote:  [facility]  [facility] (COZAAR) 50 MG tablet so they are saying a new  Rx can be sent in for the 25mg  and take 2 a day or 100mg  and take 1/2 a day

## 2018-01-03 NOTE — Telephone Encounter (Signed)
Please send in the 25 mg for 2 a day with refills for the year thanks

## 2018-01-03 NOTE — Telephone Encounter (Signed)
Rx sent 

## 2018-01-24 DIAGNOSIS — H52223 Regular astigmatism, bilateral: Secondary | ICD-10-CM | POA: Diagnosis not present

## 2018-01-24 DIAGNOSIS — E119 Type 2 diabetes mellitus without complications: Secondary | ICD-10-CM | POA: Diagnosis not present

## 2018-01-24 DIAGNOSIS — H5213 Myopia, bilateral: Secondary | ICD-10-CM | POA: Diagnosis not present

## 2018-01-24 DIAGNOSIS — H524 Presbyopia: Secondary | ICD-10-CM | POA: Diagnosis not present

## 2018-01-24 LAB — HM DIABETES EYE EXAM

## 2018-02-04 ENCOUNTER — Telehealth: Payer: Self-pay

## 2018-02-04 ENCOUNTER — Ambulatory Visit (AMBULATORY_SURGERY_CENTER): Payer: Self-pay

## 2018-02-04 VITALS — Ht 63.0 in | Wt 140.2 lb

## 2018-02-04 DIAGNOSIS — Z86010 Personal history of colonic polyps: Secondary | ICD-10-CM

## 2018-02-04 NOTE — Telephone Encounter (Signed)
I saw Donna Murphy today in [place]. I gave her the regular Miralax/ Dulcolax prep. Patient states in 2017 she did the Miralax and Dulcolax prep but had a major problem with incontinence. The patient states it was too harsh and she had to change sheets, clothes and clean the bathroom floor at least three times. She also had trouble getting into the office. The incontinence continued for another day or two. Patient is asking if she could modify the dulcolax and take less? I told the patient that I could not make that decision but I would ask you if you have any suggestions. Her procedure is 02/21/18. Please advise and I will let her know what you suggest. Thanks!   Riki Sheer, LPN ( [place] )

## 2018-02-04 NOTE — Progress Notes (Signed)
Denies allergies to eggs or soy products. Denies complication of anesthesia or sedation. Denies use of weight loss medication. Denies use of O2.   Emmi instructions declined.   Patient states when she had a colonoscopy in 2017 using Miralax and Dulcolax she had major incontinence of stool. The patient was upset because she had to change sheets, clothing and clean up the bathroom at least 3 times. Patient is requesting to take less dulcolax. A note will be sent to Dr. Carlean Purl and I will call the patient when I hear back from him.

## 2018-02-05 NOTE — Telephone Encounter (Signed)
I called Donna Murphy to discuss Dr. Celesta Aver suggestions using Dulcolax with her prep. Patient was given all of the options and verbalizes understanding of modified instructions. Donna Murphy was very appreciative that Dr. Carlean Purl responded as quick as he did. Donna Murphy was dreading the prep due to her experience last time.   Riki Sheer, LPN ( [place] )

## 2018-02-05 NOTE — Telephone Encounter (Signed)
Ask her to the 2 dulcolax the day before prep and none on the day of prep with [facility] this goes smoother  If she thinks she will be fine without any dulcolax I will trust her judgement, though

## 2018-02-11 ENCOUNTER — Encounter: Payer: Self-pay | Admitting: Family Medicine

## 2018-02-19 ENCOUNTER — Other Ambulatory Visit: Payer: Self-pay

## 2018-02-21 ENCOUNTER — Ambulatory Visit (AMBULATORY_SURGERY_CENTER): Payer: Medicare Other | Admitting: Internal Medicine

## 2018-02-21 ENCOUNTER — Encounter: Payer: Self-pay | Admitting: Internal Medicine

## 2018-02-21 VITALS — BP 124/66 | HR 58 | Temp 97.5°F | Resp 15 | Ht 63.0 in | Wt 140.0 lb

## 2018-02-21 DIAGNOSIS — Z86010 Personal history of colonic polyps: Secondary | ICD-10-CM

## 2018-02-21 MED ORDER — SODIUM CHLORIDE 0.9 % IV SOLN: 500.0000 mL | Freq: Once | INTRAVENOUS | Status: DC

## 2018-02-21 NOTE — Progress Notes (Signed)
PT taken to PACU. Monitors in place. VSS. Report given to RN. 

## 2018-02-21 NOTE — Op Note (Addendum)
[facility] Patient Name: Donna Murphy Procedure Date: 02/21/2018 9:53 AM MRN: [id number] Endoscopist: Gatha Mayer , MD Age: 72 Referring MD:  Date of Birth: 07-14-1945 Gender: Female Account #: [id number] Procedure:                Colonoscopy Indications:              High risk colon cancer surveillance: Personal                            history of colonic polyps adenoma w/ high-grade                            dysplasia Medicines:                Propofol per Anesthesia, Monitored Anesthesia Care Procedure:                Pre-Anesthesia Assessment:                           - Prior to the procedure, a History and Physical                            was performed, and patient medications and                            allergies were reviewed. The patient's tolerance of                            previous anesthesia was also reviewed. The risks                            and benefits of the procedure and the sedation                            options and risks were discussed with the patient.                            All questions were answered, and informed consent                            was obtained. Prior Anticoagulants: The patient has                            taken no previous anticoagulant or antiplatelet                            agents. ASA Grade Assessment: II - A patient with                            mild systemic disease. After reviewing the risks                            and benefits, the patient was deemed in  satisfactory condition to undergo the procedure.                           After obtaining informed consent, the colonoscope                            was passed under direct vision. Throughout the                            procedure, the patient's blood pressure, pulse, and                            oxygen saturations were monitored continuously. The                            Colonoscope was introduced  through the anus and                            advanced to the the cecum, identified by                            appendiceal orifice and ileocecal valve. The                            ileocecal valve, appendiceal orifice, and rectum                            were photographed. The quality of the bowel                            preparation was adequate. The bowel preparation                            used was Miralax. Scope In: 10:03:12 AM Scope Out: 10:20:34 AM Scope Withdrawal Time: 0 hours 13 minutes 2 seconds  Total Procedure Duration: 0 hours 17 minutes 22 seconds  Findings:                 The perianal and digital rectal examinations were                            normal.                           Multiple small and large-mouthed diverticula were                            found in the entire colon.                           The exam was otherwise without abnormality on                            direct and retroflexion views. Complications:            No immediate complications. Estimated blood loss:  None. Estimated Blood Loss:     Estimated blood loss: none. Impression:               - Moderate diverticulosis in the entire examined                            colon.                           - The examination was otherwise normal on direct                            and retroflexion views.                           - No specimens collected.                           - Personal history of colonic polyps. Recommendation:           - Consider Repeat colonoscopy in 5 years for                            surveillance. she will be 69 so maybe not given age                           - Resume previous diet.                           - Continue present medications. Gatha Mayer, MD 02/21/2018 10:52:17 AM This report has been signed electronically.

## 2018-02-21 NOTE — Patient Instructions (Addendum)
   No polyps or cancer seen.  Consider repeat colonoscopy in 5 years. We usually stop routine colonoscopy somewhere between 75-80 for age alone.  I appreciate the opportunity to care for you. Gatha Mayer, MD, FACG YOU HAD AN ENDOSCOPIC PROCEDURE TODAY AT [facility]:   Refer to the procedure report that was given to you for any specific questions about what was found during the examination.  If the procedure report does not answer your questions, please call your gastroenterologist to clarify.  If you requested that your care partner not be given the details of your procedure findings, then the procedure report has been included in a sealed envelope for you to review at your convenience later.  YOU SHOULD EXPECT: Some feelings of bloating in the abdomen. Passage of more gas than usual.  Walking can help get rid of the air that was put into your GI tract during the procedure and reduce the bloating. If you had a lower endoscopy (such as a colonoscopy or flexible sigmoidoscopy) you may notice spotting of blood in your stool or on the toilet paper. If you underwent a bowel prep for your procedure, you may not have a normal bowel movement for a few days.  Please Note:  You might notice some irritation and congestion in your nose or some drainage.  This is from the oxygen used during your procedure.  There is no need for concern and it should clear up in a day or so.  SYMPTOMS TO REPORT IMMEDIATELY:   Following lower endoscopy (colonoscopy or flexible sigmoidoscopy):  Excessive amounts of blood in the stool  Significant tenderness or worsening of abdominal pains  Swelling of the abdomen that is new, acute  Fever of 100F or higher  For urgent or emergent issues, a gastroenterologist can be reached at any hour by calling [phone].   DIET:  We do recommend a small meal at first, but then you may proceed to your regular diet.  Drink plenty of fluids but you should  avoid alcoholic beverages for 24 hours.  ACTIVITY:  You should plan to take it easy for the rest of today and you should NOT DRIVE or use heavy machinery until tomorrow (because of the sedation medicines used during the test).    FOLLOW UP: Our staff will call the number listed on your records the next business day following your procedure to check on you and address any questions or concerns that you may have regarding the information given to you following your procedure. If we do not reach you, we will leave a message.  However, if you are feeling well and you are not experiencing any problems, there is no need to return our call.  We will assume that you have returned to your regular daily activities without incident.  If any biopsies were taken you will be contacted by phone or by letter within the next 1-3 weeks.  Please call us at [phone] if you have not heard about the biopsies in 3 weeks.    SIGNATURES/CONFIDENTIALITY: You and/or your care partner have signed paperwork which will be entered into your electronic medical record.  These signatures attest to the fact that that the information above on your After Visit Summary has been reviewed and is understood.  Full responsibility of the confidentiality of this discharge information lies with you and/or your care-partner.

## 2018-02-22 ENCOUNTER — Telehealth: Payer: Self-pay

## 2018-02-22 NOTE — Telephone Encounter (Signed)
  Follow up Call-  Call back number 02/21/2018 02/17/2016  Post procedure Call Back phone  # [phone]  Permission to leave phone message Yes Yes  Some recent data might be hidden     Patient questions:  Do you have a fever, pain , or abdominal swelling? No. Pain Score  0 *  Have you tolerated food without any problems? Yes.    Have you been able to return to your normal activities? Yes.    Do you have any questions about your discharge instructions: Diet   No. Medications  No. Follow up visit  No.  Do you have questions or concerns about your Care? No.  Actions: * If pain score is 4 or above: No action needed, pain <4.

## 2018-06-03 ENCOUNTER — Encounter: Payer: Self-pay | Admitting: Family Medicine

## 2018-06-03 ENCOUNTER — Ambulatory Visit (INDEPENDENT_AMBULATORY_CARE_PROVIDER_SITE_OTHER): Payer: Medicare Other | Admitting: Family Medicine

## 2018-06-03 VITALS — BP 142/80 | HR 76 | Temp 98.1°F | Ht 63.0 in | Wt 141.2 lb

## 2018-06-03 DIAGNOSIS — E1165 Type 2 diabetes mellitus with hyperglycemia: Secondary | ICD-10-CM | POA: Diagnosis not present

## 2018-06-03 DIAGNOSIS — I10 Essential (primary) hypertension: Secondary | ICD-10-CM

## 2018-06-03 DIAGNOSIS — J0110 Acute frontal sinusitis, unspecified: Secondary | ICD-10-CM

## 2018-06-03 DIAGNOSIS — C182 Malignant neoplasm of ascending colon: Secondary | ICD-10-CM | POA: Diagnosis not present

## 2018-06-03 DIAGNOSIS — J329 Chronic sinusitis, unspecified: Secondary | ICD-10-CM | POA: Insufficient documentation

## 2018-06-03 MED ORDER — AMOXICILLIN-POT CLAVULANATE 875-125 MG PO TABS: 1.0000 | ORAL_TABLET | Freq: Two times a day (BID) | ORAL | 0 refills | Status: DC

## 2018-06-03 MED ORDER — BENZONATATE 200 MG PO CAPS: 200.0000 mg | ORAL_CAPSULE | Freq: Three times a day (TID) | ORAL | 1 refills | Status: DC | PRN

## 2018-06-03 NOTE — Assessment & Plan Note (Signed)
S/p uri with frontal sinus tenderness and purulent nasal drainage Cover with augmentin  Avs: For sinusitis - drink lots of fluids and get rest  Get back on flonase Take augmentin as directed  Tessalon for cough is ok   mucinex may help loosen congestion   Update if not starting to improve in a week or if worsening

## 2018-06-03 NOTE — Assessment & Plan Note (Signed)
BP up mildly today- possibly due to illness Will continue to follow BP: (!) 142/80

## 2018-06-03 NOTE — Progress Notes (Signed)
Subjective:    Patient ID: Donna Murphy, female    DOB: Jun 15, 1945, 73 y.o.   MRN: [id number]  HPI Here for ST/cough and uri symptoms  Started a week ago  Husband had uri  ST at first  Then a lot of coughing  Headache and pressure  Ears both hurt  2 days ago L eye got red and drained  Now both eyes bother her -puffy and crusted shut in the ams   Facial pain - above eyes  Both sides   No fever  No chills   Has used tessalon 200 Drops -homeopathic for eyes  Day quil ny quil  Helpful  Has not used flonase    bp is up today BP Readings from Last 3 Encounters:  06/03/18 (!) 152/82  02/21/18 124/66  01/01/18 130/80   Wt Readings from Last 3 Encounters:  06/03/18 141 lb 3 oz (64 kg)  02/21/18 140 lb (63.5 kg)  02/04/18 140 lb 3.2 oz (63.6 kg)   Patient Active Problem List   Diagnosis Date Noted  . Sinus infection 06/03/2018  . History of colon polyps 05/24/2017  . Hx of colonic polyps 03/01/2016  . Encounter for Medicare annual wellness exam 11/12/2013  . Other screening mammogram 05/24/2011  . Gynecological examination 05/24/2011  . Diabetes mellitus type 2, uncontrolled ([place]) 12/04/2006  . Hyperlipidemia 07/12/2006  . Essential hypertension 07/12/2006  . VARICOSE VEIN 07/12/2006  . ALLERGIC RHINITIS 07/12/2006  . GERD 07/12/2006  . IRRITABLE BOWEL SYNDROME 07/12/2006   Past Medical History:  Diagnosis Date  . Allergic rhinitis   . Allergy   . DMII (diabetes mellitus, type 2) ([place])   . Fibroids    uterine  . GERD (gastroesophageal reflux disease)   . HLD (hyperlipidemia)   . HTN (hypertension)   . Hx of colonic polyps 03/01/2016  . Irritable bowel syndrome   . Kidney stone    passed stone-no surgery required  . Skin cancer of lip    basal cell  . Varices of other sites    Past Surgical History:  Procedure Laterality Date  . COLONOSCOPY  9/07   negative - gessner  . ENDOMETRIAL BIOPSY  10/01   negative  . TONSILLECTOMY    . TOOTH EXTRACTION      Social History   Tobacco Use  . Smoking status: Never Smoker  . Smokeless tobacco: Never Used  Substance Use Topics  . Alcohol use: Yes    Alcohol/week: 7.0 - 10.0 standard drinks    Types: 7 - 10 Glasses of wine per week    Comment: occ  . Drug use: No   Family History  Problem Relation Age of Onset  . Coronary artery disease Mother   . Hyperlipidemia Mother   . Hypertension Mother   . Osteoporosis Mother   . Heart attack Mother        CABG in her 16's  . Diabetes Father   . Heart failure Father   . Hypertension Father   . Transient ischemic attack Father   . Diabetes Unknown        GF  . Hypertension Brother   . Heart murmur Son   . Brain cancer Sister   . Colon cancer Neg Hx   . Esophageal cancer Neg Hx   . Stomach cancer Neg Hx   . Rectal cancer Neg Hx    Allergies  Allergen Reactions  . Ciprofloxacin     Arm numbness  . Sulfonamide Derivatives  Hives, rash   Current Outpatient Medications on File Prior to Visit  Medication Sig Dispense Refill  . aspirin 81 MG tablet Take 81 mg by mouth daily.      [name] atorvastatin (LIPITOR) 40 MG tablet Take 1 tablet (40 mg total) by mouth daily. 90 tablet 3  . Cholecalciferol (VITAMIN D) 2000 UNITS tablet Take 2,000 Units by mouth daily.    . Cyanocobalamin (B-12 PO) Take by mouth.    [name] FARXIGA 10 MG TABS tablet Take 10 mg by mouth daily.  6  . fluticasone (FLONASE) 50 MCG/ACT nasal spray Place 2 sprays into both nostrils daily.    [name] glimepiride (AMARYL) 4 MG tablet Take 4 mg by mouth 2 (two) times daily with a meal.    . glucose blood (ONETOUCH VERIO) test strip Ck blood sugar once daily and as directed. Dx E11.9 100 each 1  . JANUVIA 100 MG tablet TAKE 1 TABLET BY MOUTH ONCE DAILY FOR 30 DAYS  5  . Krill Oil 300 MG CAPS Take 1 capsule by mouth daily.    [name] loratadine (CLARITIN) 10 MG tablet Take 10 mg by mouth daily as needed.     [name] losartan (COZAAR) 25 MG tablet Take 2 tablets (50 mg total) by mouth daily. 180  tablet 3  . metFORMIN (GLUCOPHAGE) 500 MG tablet Take 1,000 mg by mouth 2 (two) times daily with a meal.    . Multiple Vitamin (MULTIVITAMIN) tablet Take 1 tablet by mouth daily.      [name] omeprazole (PRILOSEC) 20 MG capsule Take 1 capsule (20 mg total) by mouth daily. 90 capsule 3  . triamcinolone cream (KENALOG) 0.1 % Apply 1 application topically 2 (two) times daily. To affected area 30 g 1   No current facility-administered medications on file prior to visit.      Review of Systems  Constitutional: Positive for appetite change. Negative for fatigue and fever.  HENT: Positive for congestion, ear pain, postnasal drip, rhinorrhea, sinus pressure and sore throat. Negative for nosebleeds.   Eyes: Negative for pain, redness and itching.  Respiratory: Positive for cough. Negative for shortness of breath and wheezing.   Cardiovascular: Negative for chest pain.  Gastrointestinal: Negative for abdominal pain, diarrhea, nausea and vomiting.  Endocrine: Negative for polyuria.  Genitourinary: Negative for dysuria, frequency and urgency.  Musculoskeletal: Negative for arthralgias and myalgias.  Allergic/Immunologic: Negative for immunocompromised state.  Neurological: Positive for headaches. Negative for dizziness, tremors, syncope, weakness and numbness.  Hematological: Negative for adenopathy. Does not bruise/bleed easily.  Psychiatric/Behavioral: Negative for dysphoric mood. The patient is not nervous/anxious.        Objective:   Physical Exam Constitutional:      General: She is not in acute distress.    Appearance: Normal appearance. She is well-developed and normal weight. She is not ill-appearing.  HENT:     Head: Normocephalic and atraumatic.     Comments: Bilateral  frontal sinus tenderness    Right Ear: Tympanic membrane, ear canal and external ear normal.     Left Ear: Tympanic membrane, ear canal and external ear normal.     Nose: Congestion and rhinorrhea present.      Mouth/Throat:     Pharynx: Oropharynx is clear. No oropharyngeal exudate or posterior oropharyngeal erythema.     Comments: Clear pnd Eyes:     General:        Right eye: No discharge.        Left eye: No discharge.  Conjunctiva/sclera: Conjunctivae normal.     Pupils: Pupils are equal, round, and reactive to light.  Neck:     Musculoskeletal: Normal range of motion and neck supple.  Cardiovascular:     Rate and Rhythm: Normal rate and regular rhythm.  Pulmonary:     Effort: Pulmonary effort is normal. No respiratory distress.     Breath sounds: Normal breath sounds. No wheezing or rales.     Comments: Good air exch No rales or rhonchi Lymphadenopathy:     Cervical: No cervical adenopathy.  Skin:    General: Skin is warm and dry.     Findings: No rash.  Neurological:     Mental Status: She is alert.     Cranial Nerves: No cranial nerve deficit.     Coordination: Coordination normal.  Psychiatric:        Mood and Affect: Mood normal.           Assessment & Plan:   Problem List Items Addressed This Visit      Cardiovascular and Mediastinum   Essential hypertension    BP up mildly today- possibly due to illness Will continue to follow BP: (!) 142/80            Respiratory   Sinus infection - Primary    S/p uri with frontal sinus tenderness and purulent nasal drainage Cover with augmentin  Avs: For sinusitis - drink lots of fluids and get rest  Get back on flonase Take augmentin as directed  Tessalon for cough is ok   mucinex may help loosen congestion   Update if not starting to improve in a week or if worsening        Relevant Medications   amoxicillin-clavulanate (AUGMENTIN) 875-125 MG tablet   benzonatate (TESSALON) 200 MG capsule     Digestive   Malignant neoplasm of ascending colon (HCC)    H/o polyp removed with high grade dysplasia Continues GI f/u  Last colonoscopy 11/19        Relevant Medications   amoxicillin-clavulanate  (AUGMENTIN) 875-125 MG tablet     Endocrine   Diabetes mellitus type 2, uncontrolled ([place])    Seeing Dr Chalmers Cater and doing well  Last A1C was 6.4 in august

## 2018-06-03 NOTE — Patient Instructions (Signed)
For sinusitis - drink lots of fluids and get rest  Get back on flonase Take augmentin as directed  Tessalon for cough is ok   mucinex may help loosen congestion   Update if not starting to improve in a week or if worsening

## 2018-06-03 NOTE — Assessment & Plan Note (Addendum)
Seeing Dr Chalmers Cater and doing well  Last A1C was 6.4 in august

## 2018-06-03 NOTE — Assessment & Plan Note (Signed)
H/o polyp removed with high grade dysplasia Continues GI f/u  Last colonoscopy 11/19

## 2018-10-01 DIAGNOSIS — E1165 Type 2 diabetes mellitus with hyperglycemia: Secondary | ICD-10-CM | POA: Diagnosis not present

## 2018-10-01 DIAGNOSIS — E7800 Pure hypercholesterolemia, unspecified: Secondary | ICD-10-CM | POA: Diagnosis not present

## 2018-10-01 DIAGNOSIS — I10 Essential (primary) hypertension: Secondary | ICD-10-CM | POA: Diagnosis not present

## 2018-10-01 MED FILL — GLIMEPIRIDE 4 MG TABLET: 4 | 30 days supply | Qty: 60 | Fill #0

## 2018-10-01 MED FILL — metFORMIN HCL ER 500 MG TB2: 500 | 30 days supply | Qty: 120 | Fill #0

## 2018-10-15 MED FILL — FARXIGA 10 MG TABLET: 10 | 30 days supply | Qty: 30 | Fill #0

## 2018-11-01 ENCOUNTER — Encounter: Payer: Self-pay | Admitting: Family Medicine

## 2018-11-15 MED FILL — GLIMEPIRIDE 4 MG TABLET: 4 | 30 days supply | Qty: 60 | Fill #1

## 2018-11-15 MED FILL — METFORMIN HCL ER 500 MG TB2: 500 | 30 days supply | Qty: 120 | Fill #1

## 2018-11-25 MED FILL — FARXIGA 10 MG TABLET: 10 | 30 days supply | Qty: 30 | Fill #1

## 2018-12-09 MED FILL — GLIMEPIRIDE 4 MG TABLET: 4 | 30 days supply | Qty: 60 | Fill #2

## 2018-12-24 MED FILL — FARXIGA 10 MG TABLET: 10 | 30 days supply | Qty: 30 | Fill #2

## 2018-12-24 MED FILL — metFORMIN HCL ER 500 MG TB2: 500 | 30 days supply | Qty: 120 | Fill #2

## 2019-01-01 DIAGNOSIS — Z23 Encounter for immunization: Secondary | ICD-10-CM | POA: Diagnosis not present

## 2019-01-02 ENCOUNTER — Other Ambulatory Visit: Payer: Self-pay | Admitting: *Deleted

## 2019-01-02 NOTE — Telephone Encounter (Signed)
Pt had an acute appt on 06/03/18 but no recent or future CPE or F/u, please advise   [facility]

## 2019-01-02 NOTE — Telephone Encounter (Signed)
Please schedule PE and refill  Until then

## 2019-01-06 ENCOUNTER — Telehealth: Payer: Self-pay | Admitting: Family Medicine

## 2019-01-06 MED ORDER — LOSARTAN POTASSIUM 25 MG PO TABS: 50.0000 mg | ORAL_TABLET | Freq: Every day | ORAL | 0 refills | Status: DC

## 2019-01-06 MED ORDER — ATORVASTATIN CALCIUM 40 MG PO TABS: 40.0000 mg | ORAL_TABLET | Freq: Every day | ORAL | 0 refills | Status: DC

## 2019-01-06 NOTE — Telephone Encounter (Signed)
[facility] prev phon note [facility] only sent a ref[facility] request for [facility]itor which we have filled. Rx for the losartan filled now

## 2019-01-06 NOTE — Telephone Encounter (Signed)
Best number [phone]  Pt called needing to get a refill on losartan She stated drug store has sent refill request  [facility]  [address] [phone]

## 2019-01-06 NOTE — Telephone Encounter (Signed)
Med refilled once and Carrie will reach out to pt to try and get appt scheduled  

## 2019-01-20 MED FILL — GLIMEPIRIDE 4 MG TABLET: 4 | 30 days supply | Qty: 60 | Fill #3

## 2019-01-27 MED FILL — metFORMIN HCL ER 500 MG TB2: 500 | 30 days supply | Qty: 120 | Fill #3

## 2019-01-27 MED FILL — FARXIGA 10 MG TABLET: 10 | 30 days supply | Qty: 30 | Fill #3

## 2019-02-12 ENCOUNTER — Telehealth: Payer: Self-pay | Admitting: Family Medicine

## 2019-02-12 DIAGNOSIS — I10 Essential (primary) hypertension: Secondary | ICD-10-CM

## 2019-02-12 DIAGNOSIS — E7800 Pure hypercholesterolemia, unspecified: Secondary | ICD-10-CM

## 2019-02-12 NOTE — Telephone Encounter (Signed)
-----   Message from Ellamae Sia sent at 02/06/2019 12:35 PM EST ----- Regarding: lab orders for Thursday, 11.12.20 Patient is scheduled for CPX labs, please order future labs, Thanks , Karna Christmas

## 2019-02-13 ENCOUNTER — Other Ambulatory Visit (INDEPENDENT_AMBULATORY_CARE_PROVIDER_SITE_OTHER): Payer: Medicare Other

## 2019-02-13 DIAGNOSIS — I10 Essential (primary) hypertension: Secondary | ICD-10-CM

## 2019-02-13 DIAGNOSIS — E7800 Pure hypercholesterolemia, unspecified: Secondary | ICD-10-CM

## 2019-02-13 LAB — CBC WITH DIFFERENTIAL/PLATELET
Basophils Absolute: 0 10*3/uL (ref 0.0–0.1)
Basophils Relative: 0.7 % (ref 0.0–3.0)
Eosinophils Absolute: 0.2 10*3/uL (ref 0.0–0.7)
Eosinophils Relative: 3.4 % (ref 0.0–5.0)
HCT: 43.9 % (ref 36.0–46.0)
Hemoglobin: 14.1 g/dL (ref 12.0–15.0)
Lymphocytes Relative: 29.6 % (ref 12.0–46.0)
Lymphs Abs: 2 10*3/uL (ref 0.7–4.0)
MCHC: 32.2 g/dL (ref 30.0–36.0)
MCV: 88.7 fl (ref 78.0–100.0)
Monocytes Absolute: 0.5 10*3/uL (ref 0.1–1.0)
Monocytes Relative: 7.1 % (ref 3.0–12.0)
Neutro Abs: 4.1 10*3/uL (ref 1.4–7.7)
Neutrophils Relative %: 59.2 % (ref 43.0–77.0)
Platelets: 335 10*3/uL (ref 150.0–400.0)
RBC: 4.95 Mil/uL (ref 3.87–5.11)
RDW: 13.9 % (ref 11.5–15.5)
WBC: 6.9 10*3/uL (ref 4.0–10.5)

## 2019-02-13 LAB — LIPID PANEL
Cholesterol: 148 mg/dL (ref 0–200)
HDL: 53.8 mg/dL (ref >39.00)
LDL Cholesterol: 71 mg/dL (ref 0–99)
NonHDL: 93.77
Total CHOL/HDL Ratio: 3
Triglycerides: 115 mg/dL (ref 0.0–149.0)
VLDL: 23 mg/dL (ref 0.0–40.0)

## 2019-02-13 LAB — COMPREHENSIVE METABOLIC PANEL
ALT: 27 U/L (ref 0–35)
AST: 19 U/L (ref 0–37)
Albumin: 4.4 g/dL (ref 3.5–5.2)
Alkaline Phosphatase: 66 U/L (ref 39–117)
BUN: 17 mg/dL (ref 6–23)
CO2: 28 mEq/L (ref 19–32)
Calcium: 9.7 mg/dL (ref 8.4–10.5)
Chloride: 107 mEq/L (ref 96–112)
Creatinine, Ser: 0.74 mg/dL (ref 0.40–1.20)
GFR: 77.1 mL/min (ref >60.00)
Glucose, Bld: 154 mg/dL — ABNORMAL HIGH (ref 70–99)
Potassium: 4.3 mEq/L (ref 3.5–5.1)
Sodium: 142 mEq/L (ref 135–145)
Total Bilirubin: 0.6 mg/dL (ref 0.2–1.2)
Total Protein: 6.8 g/dL (ref 6.0–8.3)

## 2019-02-13 LAB — TSH: TSH: 1.1 u[IU]/mL (ref 0.35–4.50)

## 2019-02-18 ENCOUNTER — Encounter: Payer: Self-pay | Admitting: Family Medicine

## 2019-02-18 ENCOUNTER — Other Ambulatory Visit: Payer: Self-pay

## 2019-02-18 ENCOUNTER — Ambulatory Visit (INDEPENDENT_AMBULATORY_CARE_PROVIDER_SITE_OTHER): Payer: Medicare Other | Admitting: Family Medicine

## 2019-02-18 VITALS — BP 132/80 | HR 78 | Temp 97.5°F | Ht 63.25 in | Wt 137.4 lb

## 2019-02-18 DIAGNOSIS — Z860100 Personal history of colon polyps, unspecified: Secondary | ICD-10-CM

## 2019-02-18 DIAGNOSIS — Z1231 Encounter for screening mammogram for malignant neoplasm of breast: Secondary | ICD-10-CM

## 2019-02-18 DIAGNOSIS — E1165 Type 2 diabetes mellitus with hyperglycemia: Secondary | ICD-10-CM

## 2019-02-18 DIAGNOSIS — C182 Malignant neoplasm of ascending colon: Secondary | ICD-10-CM | POA: Diagnosis not present

## 2019-02-18 DIAGNOSIS — E785 Hyperlipidemia, unspecified: Secondary | ICD-10-CM

## 2019-02-18 DIAGNOSIS — I10 Essential (primary) hypertension: Secondary | ICD-10-CM

## 2019-02-18 DIAGNOSIS — E7800 Pure hypercholesterolemia, unspecified: Secondary | ICD-10-CM

## 2019-02-18 DIAGNOSIS — E1169 Type 2 diabetes mellitus with other specified complication: Secondary | ICD-10-CM | POA: Diagnosis not present

## 2019-02-18 DIAGNOSIS — Z0000 Encounter for general adult medical examination without abnormal findings: Secondary | ICD-10-CM | POA: Diagnosis not present

## 2019-02-18 MED ORDER — ATORVASTATIN CALCIUM 40 MG PO TABS: 40.0000 mg | ORAL_TABLET | Freq: Every day | ORAL | 3 refills | Status: DC

## 2019-02-18 MED ORDER — OMEPRAZOLE 20 MG PO CPDR: 20.0000 mg | DELAYED_RELEASE_CAPSULE | Freq: Every day | ORAL | 3 refills | Status: DC

## 2019-02-18 MED ORDER — LOSARTAN POTASSIUM 25 MG PO TABS: 50.0000 mg | ORAL_TABLET | Freq: Every day | ORAL | 3 refills | Status: DC

## 2019-02-18 NOTE — Assessment & Plan Note (Signed)
High grade dysplasia 11/19  5 y recall for colonoscopy

## 2019-02-18 NOTE — Patient Instructions (Addendum)
We will do a mammogram referral  Stop up front on the way out   If you are interested in the new shingles vaccine (Shingrix) - call your local pharmacy to check on coverage and availability  If affordable, get on a wait list at your pharmacy to get the vaccine.  Do get a diabetic eye exam

## 2019-02-18 NOTE — Assessment & Plan Note (Signed)
Disc goals for lipids and reasons to control them Rev last labs with pt Rev low sat fat diet in detail LDL of 71 Atorvastatin 40 mg and good diet

## 2019-02-18 NOTE — Progress Notes (Signed)
Subjective:    Patient ID: Donna Murphy, female    DOB: [birth year], 73 y.o.   MRN: [id number]  HPI Here for amw and review of chronic health problems   I have personally reviewed the Medicare Annual Wellness questionnaire and have noted 1. The patient's medical and social history 2. Their use of alcohol, tobacco or illicit drugs 3. Their current medications and supplements 4. The patient's functional ability including ADL's, fall risks, home safety risks and hearing or visual             impairment. 5. Diet and physical activities 6. Evidence for depression or mood disorders  The patients weight, height, BMI have been recorded in the chart and visual acuity is per eye clinic.  I have made referrals, counseling and provided education to the patient based review of the above and I have provided the pt with a written personalized care plan for preventive services. Reviewed and updated provider list, see scanned forms.  See scanned forms.  Routine anticipatory guidance given to patient.  See health maintenance. Colon cancer screening colonoscopy 11/19 with 5 y recall  H/o high grade dysplasia in the past  Breast cancer screening  Mammogram 12/18 Self breast exam- no lumps or changes  Flu vaccine - had her flu vaccine -Dr Chalmers Cater 12/17/18 Tetanus vaccine 10/19 Tdap Pneumovax completed Zoster vaccine   12/14 zostavax  Dexa 12/18 normal bmd [place]-none  Fractures-none  Supplements- takes vit D / cannot take ca (does have dairy) Exercise - lots of walking in the park most days   Advance directive-has up to date living will and poa  Cognitive function addressed- see scanned forms- and if abnormal then additional documentation follows.  No concerns - memory or cognition  Handles her own affairs   Feeling ok in general  Had a very stressful year- sold her house in [place] has MM (just came back) -doing chemo     PMH and SH reviewed  Meds, vitals, and allergies reviewed.    ROS: See HPI.  Otherwise negative.    Weight : Wt Readings from Last 3 Encounters:  02/18/19 137 lb 6 oz (62.3 kg)  06/03/18 141 lb 3 oz (64 kg)  02/21/18 140 lb (63.5 kg)  weight is stable  24.14 kg/m    Hearing/vision:  Hearing Screening   125Hz  250Hz  500Hz  1000Hz  2000Hz  3000Hz  4000Hz  6000Hz  8000Hz   Right ear:   40 40 40  40    Left ear:   40 40 40  40      Visual Acuity Screening   Right eye Left eye Both eyes  Without correction:     With correction: 20/20 20/30 20/25   goes to eye dr every other year -needs annual DM eye exam Wears glasses   bp is stable today  No cp or palpitations or headaches or edema  No side effects to medicines  BP Readings from Last 3 Encounters:  02/18/19 (!) 142/78  06/03/18 (!) 142/80  02/21/18 124/66      DM2-in July a1c was in the 7 range (went up a bit) Sees Dr Chalmers Cater  Has a visit in December  Diet is pretty good / off track a little with move/pandemic  Cutting back on wine    Hyperlipidemia Lab Results  Component Value Date   CHOL 148 02/13/2019   CHOL 159 12/25/2017   CHOL 143 11/28/2016   Lab Results  Component Value Date   HDL 53.80 02/13/2019  HDL 57.60 12/25/2017   HDL 56.10 11/28/2016   Lab Results  Component Value Date   LDLCALC 71 02/13/2019   LDLCALC 82 12/25/2017   LDLCALC 63 11/28/2016   Lab Results  Component Value Date   TRIG 115.0 02/13/2019   TRIG 98.0 12/25/2017   TRIG 119.0 11/28/2016   Lab Results  Component Value Date   CHOLHDL 3 02/13/2019   CHOLHDL 3 12/25/2017   CHOLHDL 3 11/28/2016   No results found for: LDLDIRECT Atorvastatin 40 mg  Good diet   Patient Active Problem List   Diagnosis Date Noted  . Hyperlipidemia associated with type 2 diabetes mellitus ([place]) 02/18/2019  . Screening mammogram, encounter for 02/18/2019  . Malignant neoplasm of ascending colon ([place]) 06/03/2018  . History of colon polyps 05/24/2017  . Hx of colonic polyps 03/01/2016  . Encounter for Medicare  annual wellness exam 11/12/2013  . Other screening mammogram 05/24/2011  . Gynecological examination 05/24/2011  . Diabetes mellitus type 2, uncontrolled ([place]) 12/04/2006  . Essential hypertension 07/12/2006  . VARICOSE VEIN 07/12/2006  . ALLERGIC RHINITIS 07/12/2006  . GERD 07/12/2006  . IRRITABLE BOWEL SYNDROME 07/12/2006   Past Medical History:  Diagnosis Date  . Allergic rhinitis   . Allergy   . DMII (diabetes mellitus, type 2) ([place])   . Fibroids    uterine  . GERD (gastroesophageal reflux disease)   . HLD (hyperlipidemia)   . HTN (hypertension)   . Hx of colonic polyps 03/01/2016  . Irritable bowel syndrome   . Kidney stone    passed stone-no surgery required  . Skin cancer of lip    basal cell  . Varices of other sites    Past Surgical History:  Procedure Laterality Date  . COLONOSCOPY  9/07   negative - gessner  . ENDOMETRIAL BIOPSY  10/01   negative  . TONSILLECTOMY    . TOOTH EXTRACTION     Social History   Tobacco Use  . Smoking status: Never Smoker  . Smokeless tobacco: Never Used  Substance Use Topics  . Alcohol use: Yes    Alcohol/week: 7.0 - 10.0 standard drinks    Types: 7 - 10 Glasses of wine per week    Comment: occ  . Drug use: No   Family History  Problem Relation Age of Onset  . Coronary artery disease Mother   . Hyperlipidemia Mother   . Hypertension Mother   . Osteoporosis Mother   . Heart attack Mother        CABG in her 78's  . Diabetes Father   . Heart failure Father   . Hypertension Father   . Transient ischemic attack Father   . Diabetes Unknown        GF  . Hypertension Brother   . Heart murmur Son   . Brain cancer Sister   . Colon cancer Neg Hx   . Esophageal cancer Neg Hx   . Stomach cancer Neg Hx   . Rectal cancer Neg Hx    Allergies  Allergen Reactions  . Ciprofloxacin     Arm numbness  . Sulfonamide Derivatives     Hives, rash   Current Outpatient Medications on File Prior to Visit  Medication Sig Dispense  Refill  . aspirin 81 MG tablet Take 81 mg by mouth daily.      . Cholecalciferol (VITAMIN D) 2000 UNITS tablet Take 2,000 Units by mouth daily.    . Cyanocobalamin (B-12 PO) Take by mouth. Takes occassionally    .  FARXIGA 10 MG TABS tablet Take 10 mg by mouth daily.  6  . fluticasone (FLONASE) 50 MCG/ACT nasal spray Place 2 sprays into both nostrils daily.    [name] glimepiride (AMARYL) 4 MG tablet Take 4 mg by mouth 2 (two) times daily with a meal.    . glucose blood (ONETOUCH VERIO) test strip Ck blood sugar once daily and as directed. Dx E11.9 100 each 1  . JANUVIA 100 MG tablet TAKE 1 TABLET BY MOUTH ONCE DAILY FOR 30 DAYS  5  . Krill Oil 300 MG CAPS Take 1 capsule by mouth daily.    [name] loratadine (CLARITIN) 10 MG tablet Take 10 mg by mouth daily as needed.     . metFORMIN (GLUCOPHAGE) 500 MG tablet Take 1,000 mg by mouth 2 (two) times daily with a meal.    . Multiple Vitamin (MULTIVITAMIN) tablet Take 1 tablet by mouth daily.       No current facility-administered medications on file prior to visit.      Review of Systems  Constitutional: Negative for activity change, appetite change, fatigue, fever and unexpected weight change.  HENT: Negative for congestion, ear pain, rhinorrhea, sinus pressure and sore throat.   Eyes: Negative for pain, redness and visual disturbance.  Respiratory: Negative for cough, shortness of breath and wheezing.   Cardiovascular: Negative for chest pain and palpitations.  Gastrointestinal: Negative for abdominal pain, blood in stool, constipation and diarrhea.  Endocrine: Negative for polydipsia and polyuria.  Genitourinary: Negative for dysuria, frequency and urgency.  Musculoskeletal: Negative for arthralgias, back pain and myalgias.  Skin: Negative for pallor and rash.  Allergic/Immunologic: Negative for environmental allergies.  Neurological: Negative for dizziness, syncope and headaches.  Hematological: Negative for adenopathy. Does not bruise/bleed easily.   Psychiatric/Behavioral: Negative for decreased concentration and dysphoric mood. The patient is not nervous/anxious.        Objective:   Physical Exam Constitutional:      General: She is not in acute distress.    Appearance: Normal appearance. She is well-developed and normal weight. She is not ill-appearing or diaphoretic.  HENT:     Head: Normocephalic and atraumatic.     Right Ear: Tympanic membrane, ear canal and external ear normal.     Left Ear: Tympanic membrane, ear canal and external ear normal.     Nose: Nose normal. No congestion.     Mouth/Throat:     Mouth: Mucous membranes are moist.     Pharynx: Oropharynx is clear. No posterior oropharyngeal erythema.  Eyes:     General: No scleral icterus.    Extraocular Movements: Extraocular movements intact.     Conjunctiva/sclera: Conjunctivae normal.     Pupils: Pupils are equal, round, and reactive to light.  Neck:     Musculoskeletal: Normal range of motion and neck supple. No neck rigidity or muscular tenderness.     Thyroid: No thyromegaly.     Vascular: No carotid bruit or JVD.  Cardiovascular:     Rate and Rhythm: Normal rate and regular rhythm.     Pulses: Normal pulses.     Heart sounds: Normal heart sounds. No gallop.   Pulmonary:     Effort: Pulmonary effort is normal. No respiratory distress.     Breath sounds: Normal breath sounds. No wheezing.     Comments: Good air exch Chest:     Chest wall: No tenderness.  Abdominal:     General: Bowel sounds are normal. There is no distension or abdominal bruit.  Palpations: Abdomen is soft. There is no mass.     Tenderness: There is no abdominal tenderness.     Hernia: No hernia is present.  Genitourinary:    Comments: Breast exam: No mass, nodules, thickening, tenderness, bulging, retraction, inflamation, nipple discharge or skin changes noted.  No axillary or clavicular LA.     Musculoskeletal: Normal range of motion.        General: No tenderness.     Right  lower leg: No edema.     Left lower leg: No edema.  Lymphadenopathy:     Cervical: No cervical adenopathy.  Skin:    General: Skin is warm and dry.     Coloration: Skin is not pale.     Findings: No erythema or rash.     Comments: Solar lentigines diffusely   Neurological:     Mental Status: She is alert. Mental status is at baseline.     Cranial Nerves: No cranial nerve deficit.     Motor: No abnormal muscle tone.     Coordination: Coordination normal.     Gait: Gait normal.     Deep Tendon Reflexes: Reflexes are normal and symmetric. Reflexes normal.  Psychiatric:        Mood and Affect: Mood normal.        Cognition and Memory: Cognition and memory normal.           Assessment & Plan:   Problem List Items Addressed This Visit      Cardiovascular and Mediastinum   Essential hypertension    bp in fair control at this time  BP Readings from Last 1 Encounters:  02/18/19 132/80   No changes needed Most recent labs reviewed  Disc lifstyle change with low sodium diet and exercise        Relevant Medications   losartan (COZAAR) 25 MG tablet   atorvastatin (LIPITOR) 40 MG tablet     Digestive   Malignant neoplasm of ascending colon (HCC)    Colonoscopy was 11/19  5 year recall  H/o high grade dysplasia in the past         Endocrine   Diabetes mellitus type 2, uncontrolled ([place])    Sees Dr Chalmers Cater for diabetes care Per pt A1C in July was in the 7 range (up a bit)  Diet is fair but got off track with pandemic and mood She has cut back on wine intake Is late for her DM eye exam- says she will schedule that  On arb and statin       Relevant Medications   losartan (COZAAR) 25 MG tablet   atorvastatin (LIPITOR) 40 MG tablet   Hyperlipidemia associated with type 2 diabetes mellitus (HCC)    Disc goals for lipids and reasons to control them Rev last labs with pt Rev low sat fat diet in detail LDL of 71 Atorvastatin 40 mg and good diet      Relevant  Medications   losartan (COZAAR) 25 MG tablet   atorvastatin (LIPITOR) 40 MG tablet     Other   Encounter for Medicare annual wellness exam - Primary    Reviewed health habits including diet and exercise and skin cancer prevention Reviewed appropriate screening tests for age  Also reviewed health mt list, fam hx and immunization status , as well as social and family history   See HPI Labs reviewed Due for mammogram- referral done  Disc shingrix vaccine-if interested to check on coverage  Nl dexa 12/18 and no  falls or fx - taking vit D Adv directive is up to date  No cognitive concerns Normal hearing screen  Vision screen stable- due for DM eye exam        Hx of colonic polyps    High grade dysplasia 11/19  5 y recall for colonoscopy      Screening mammogram, encounter for    Scheduled annual screening mammogram Nl breast exam today  Encouraged monthly self exams        Relevant Orders   MM 3D SCREEN BREAST BILATERAL   RESOLVED: Hyperlipidemia   Relevant Medications   losartan (COZAAR) 25 MG tablet   atorvastatin (LIPITOR) 40 MG tablet

## 2019-02-18 NOTE — Assessment & Plan Note (Signed)
Reviewed health habits including diet and exercise and skin cancer prevention Reviewed appropriate screening tests for age  Also reviewed health mt list, fam hx and immunization status , as well as social and family history   See HPI Labs reviewed Due for mammogram- referral done  Disc shingrix vaccine-if interested to check on coverage  Nl dexa 12/18 and no falls or fx - taking vit D Adv directive is up to date  No cognitive concerns Normal hearing screen  Vision screen stable- due for DM eye exam

## 2019-02-18 NOTE — Assessment & Plan Note (Signed)
Sees Dr Chalmers Cater for diabetes care Per pt A1C in July was in the 7 range (up a bit)  Diet is fair but got off track with pandemic and mood She has cut back on wine intake Is late for her DM eye exam- says she will schedule that  On arb and statin

## 2019-02-18 NOTE — Assessment & Plan Note (Signed)
bp in fair control at this time  BP Readings from Last 1 Encounters:  02/18/19 132/80   No changes needed Most recent labs reviewed  Disc lifstyle change with low sodium diet and exercise

## 2019-02-18 NOTE — Assessment & Plan Note (Signed)
Scheduled annual screening mammogram Nl breast exam today  Encouraged monthly self exams   

## 2019-02-18 NOTE — Assessment & Plan Note (Signed)
Colonoscopy was 11/19  5 year recall  H/o high grade dysplasia in the past

## 2019-02-19 MED FILL — GLIMEPIRIDE 4 MG TABLET: 4 | 30 days supply | Qty: 60 | Fill #4

## 2019-02-20 MED FILL — FARXIGA 10 MG TABLET: 10 | 30 days supply | Qty: 30 | Fill #4

## 2019-02-24 MED FILL — METFORMIN HCL ER 500 MG TB2: 500 | 30 days supply | Qty: 120 | Fill #4

## 2019-02-26 ENCOUNTER — Other Ambulatory Visit: Payer: Self-pay

## 2019-03-21 MED FILL — GLIMEPIRIDE 4 MG TABLET: 4 | 30 days supply | Qty: 60 | Fill #5

## 2019-03-21 MED FILL — FARXIGA 10 MG TABLET: 10 | 30 days supply | Qty: 30 | Fill #5

## 2019-03-21 MED FILL — METFORMIN HCL ER 500 MG TAB: 500 | 30 days supply | Qty: 120 | Fill #5

## 2019-04-09 ENCOUNTER — Other Ambulatory Visit: Payer: Self-pay

## 2019-04-09 ENCOUNTER — Ambulatory Visit (INDEPENDENT_AMBULATORY_CARE_PROVIDER_SITE_OTHER): Payer: Medicare Other | Admitting: Family Medicine

## 2019-04-09 ENCOUNTER — Encounter: Payer: Self-pay | Admitting: Family Medicine

## 2019-04-09 VITALS — BP 132/76 | HR 86 | Temp 97.9°F | Ht 63.25 in | Wt 138.5 lb

## 2019-04-09 DIAGNOSIS — S60221A Contusion of right hand, initial encounter: Secondary | ICD-10-CM | POA: Diagnosis not present

## 2019-04-09 NOTE — Progress Notes (Signed)
Donna Mapel T. [place] Prout, MD Primary Care and [place] at [facility] [place], [address] Phone: [phone]  FAX: [place] - 74 y.o. female  MRN [id number]  Date of Birth: January 10, 1946  Visit Date: 04/09/2019  PCP: Abner Greenspan, MD  Referred by: Tower, [name], MD  Chief Complaint  Patient presents with  . Hand Injury    Right-Jammed hand 2 days ago    This visit occurred during the SARS-CoV-2 public health emergency.  Safety protocols were in place, including screening questions prior to the visit, additional usage of staff PPE, and extensive cleaning of exam room while observing appropriate contact time as indicated for disinfecting solutions.   Subjective:   Donna Murphy is a 74 y.o. very pleasant female patient who presents with the following:  Small hematoma: She is a very pleasant 74 year old, she presents after hitting her right hand 2 days ago.  She does have some bruising throughout, but she also has a hematoma approximately the size of a $0.50 piece.  Grossly 2-1/2 cm.  This is modestly elevated.  She does take 81 mg of aspirin.  Past Medical History, Surgical History, Social History, Family History, Problem List, Medications, and Allergies have been reviewed and updated if relevant.  Patient Active Problem List   Diagnosis Date Noted  . Hyperlipidemia associated with type 2 diabetes mellitus ([place]) 02/18/2019  . Screening mammogram, encounter for 02/18/2019  . Malignant neoplasm of ascending colon ([place]) 06/03/2018  . History of colon polyps 05/24/2017  . Hx of colonic polyps 03/01/2016  . Encounter for Medicare annual wellness exam 11/12/2013  . Other screening mammogram 05/24/2011  . Gynecological examination 05/24/2011  . Diabetes mellitus type 2, uncontrolled ([place]) 12/04/2006  . Essential hypertension 07/12/2006  . VARICOSE VEIN 07/12/2006  . ALLERGIC RHINITIS 07/12/2006  . GERD  07/12/2006  . IRRITABLE BOWEL SYNDROME 07/12/2006    Past Medical History:  Diagnosis Date  . Allergic rhinitis   . Allergy   . DMII (diabetes mellitus, type 2) ([place])   . Fibroids    uterine  . GERD (gastroesophageal reflux disease)   . HLD (hyperlipidemia)   . HTN (hypertension)   . Hx of colonic polyps 03/01/2016  . Irritable bowel syndrome   . Kidney stone    passed stone-no surgery required  . Skin cancer of lip    basal cell  . Varices of other sites     Past Surgical History:  Procedure Laterality Date  . COLONOSCOPY  9/07   negative - gessner  . ENDOMETRIAL BIOPSY  10/01   negative  . TONSILLECTOMY    . TOOTH EXTRACTION      Social History   Socioeconomic History  . Marital status: Married    Spouse name: Not on file  . Number of children: Not on file  . Years of education: Not on file  . Highest education level: Not on file  Occupational History  . Not on file  Tobacco Use  . Smoking status: Never Smoker  . Smokeless tobacco: Never Used  Substance and Sexual Activity  . Alcohol use: Yes    Alcohol/week: 7.0 - 10.0 standard drinks    Types: 7 - 10 Glasses of wine per week    Comment: occ  . Drug use: No  . Sexual activity: Yes    Birth control/protection: Post-menopausal  Other Topics Concern  . Not on file  Social History Narrative  Walks for exercise; does yardwork too         Social Determinants of Radio broadcast assistant Strain:   . Difficulty of Paying Living Expenses: Not on file  Food Insecurity:   . Worried About Charity fundraiser in the Last Year: Not on file  . Ran Out of Food in the Last Year: Not on file  Transportation Needs:   . Lack of Transportation (Medical): Not on file  . Lack of Transportation (Non-Medical): Not on file  Physical Activity:   . Days of Exercise per Week: Not on file  . Minutes of Exercise per Session: Not on file  Stress:   . Feeling of Stress : Not on file  Social Connections:   . Frequency  of Communication with Friends and Family: Not on file  . Frequency of Social Gatherings with Friends and Family: Not on file  . Attends Religious Services: Not on file  . Active Member of Clubs or Organizations: Not on file  . Attends Archivist Meetings: Not on file  . Marital Status: Not on file  Intimate Partner Violence:   . Fear of Current or Ex-Partner: Not on file  . Emotionally Abused: Not on file  . Physically Abused: Not on file  . Sexually Abused: Not on file    Family History  Problem Relation Age of Onset  . Coronary artery disease Mother   . Hyperlipidemia Mother   . Hypertension Mother   . Osteoporosis Mother   . Heart attack Mother        CABG in her 30's  . Diabetes Father   . Heart failure Father   . Hypertension Father   . Transient ischemic attack Father   . Diabetes Unknown        GF  . Hypertension Brother   . Heart murmur Son   . Brain cancer Sister   . Colon cancer Neg Hx   . Esophageal cancer Neg Hx   . Stomach cancer Neg Hx   . Rectal cancer Neg Hx     Allergies  Allergen Reactions  . Ciprofloxacin     Arm numbness  . Sulfonamide Derivatives     Hives, rash    Medication list reviewed and updated in full in [place].   GEN: No acute illnesses, no fevers, chills. GI: No n/v/d, eating normally Pulm: No SOB Interactive and getting along well at home.  Otherwise, ROS is as per the HPI.  Objective:   BP 132/76   Pulse 86   Temp 97.9 F (36.6 C) (Temporal)   Ht 5' 3.25" (1.607 m)   Wt 138 lb 8 oz (62.8 kg)   SpO2 97%   BMI 24.34 kg/m   GEN: WDWN, NAD, Non-toxic, A & O x 3 HEENT: Atraumatic, Normocephalic. Neck supple. No masses, No LAD. Ears and Nose: No external deformity. EXTR: No c/c/e NEURO Normal gait.  PSYCH: Normally interactive. Conversant. Not depressed or anxious appearing.  Calm demeanor.   The entirety of the bony anatomy in the distal forearm, carpal bones, metacarpals, and all phalanges is  entirely nontender with some ecchymosis.  There is also a hematoma that is palpable on the dorsum of the wrist.  Not painful, not warm.  Approximately 2-1/2 cm across.  Laboratory and Imaging Data:  Assessment and Plan:     ICD-10-CM   1. Traumatic hematoma of right hand, initial encounter  [id number]     I reassured her that this  is a hematoma, it will gradually resolve.  Ecchymosis will likely worsen this week.  Continue ice and Tylenol or anti-inflammatories.  By my exam, I have no concern for fracture.  Reassurance.  Follow-up: No follow-ups on file.  No orders of the defined types were placed in this encounter.  No orders of the defined types were placed in this encounter.   Signed,  Maud Deed. Calena Salem, MD   Outpatient Encounter Medications as of 04/09/2019  Medication Sig  . aspirin 81 MG tablet Take 81 mg by mouth daily.    [name] atorvastatin (LIPITOR) 40 MG tablet Take 1 tablet (40 mg total) by mouth daily.  . Cholecalciferol (VITAMIN D) 2000 UNITS tablet Take 2,000 Units by mouth daily.  . Cyanocobalamin (B-12 PO) Take by mouth. Takes occassionally  . FARXIGA 10 MG TABS tablet Take 10 mg by mouth daily.  . fluticasone (FLONASE) 50 MCG/ACT nasal spray Place 2 sprays into both nostrils daily.  [name] glimepiride (AMARYL) 4 MG tablet Take 4 mg by mouth 2 (two) times daily with a meal.  . glucose blood (ONETOUCH VERIO) test strip Ck blood sugar once daily and as directed. Dx E11.9  . JANUVIA 100 MG tablet TAKE 1 TABLET BY MOUTH ONCE DAILY FOR 30 DAYS  . Krill Oil 300 MG CAPS Take 1 capsule by mouth daily.  [name] loratadine (CLARITIN) 10 MG tablet Take 10 mg by mouth daily as needed.   [name] losartan (COZAAR) 25 MG tablet Take 2 tablets (50 mg total) by mouth daily.  . metFORMIN (GLUCOPHAGE) 500 MG tablet Take 1,000 mg by mouth 2 (two) times daily with a meal.  . Multiple Vitamin (MULTIVITAMIN) tablet Take 1 tablet by mouth daily.    [name] omeprazole (PRILOSEC) 20 MG capsule Take 1 capsule (20  mg total) by mouth daily.   No facility-administered encounter medications on file as of 04/09/2019.

## 2019-04-14 DIAGNOSIS — I10 Essential (primary) hypertension: Secondary | ICD-10-CM | POA: Diagnosis not present

## 2019-04-14 DIAGNOSIS — E1165 Type 2 diabetes mellitus with hyperglycemia: Secondary | ICD-10-CM | POA: Diagnosis not present

## 2019-04-14 DIAGNOSIS — E7800 Pure hypercholesterolemia, unspecified: Secondary | ICD-10-CM | POA: Diagnosis not present

## 2019-04-18 ENCOUNTER — Encounter: Payer: Self-pay | Admitting: Family Medicine

## 2019-04-18 MED FILL — GLIMEPIRIDE 4 MG TABLET: 4 | 30 days supply | Qty: 60 | Fill #6

## 2019-04-25 ENCOUNTER — Ambulatory Visit: Payer: Medicare Other | Attending: Internal Medicine

## 2019-04-25 DIAGNOSIS — Z23 Encounter for immunization: Secondary | ICD-10-CM | POA: Insufficient documentation

## 2019-04-25 NOTE — Progress Notes (Signed)
   Covid-19 Vaccination Clinic  Name:  Donna Murphy    MRN: [id number] DOB: [birth year]  04/25/2019  Donna Murphy was observed post Covid-19 immunization for 15 minutes without incidence. She was provided with Vaccine Information Sheet and instruction to access the V-Safe system.   Donna Murphy was instructed to call 911 with any severe reactions post vaccine: [name] Difficulty breathing  . Swelling of your face and throat  . A fast heartbeat  . A bad rash all over your body  . Dizziness and weakness    Immunizations Administered    Name Date Dose VIS Date Route   Pfizer COVID-19 Vaccine 04/25/2019 12:13 PM 0.3 mL 03/14/2019 Intramuscular   Manufacturer: [place]   Lot: [id number]   [place]: [id number]

## 2019-04-28 MED FILL — metFORMIN HCL ER 500 MG TB2: 500 | 30 days supply | Qty: 120 | Fill #6

## 2019-04-28 MED FILL — FARXIGA 10 MG TABLET: 10 | 30 days supply | Qty: 30 | Fill #6

## 2019-05-17 ENCOUNTER — Ambulatory Visit: Payer: Medicare Other | Attending: Internal Medicine

## 2019-05-17 DIAGNOSIS — Z23 Encounter for immunization: Secondary | ICD-10-CM

## 2019-05-17 NOTE — Progress Notes (Signed)
   Covid-19 Vaccination Clinic  Name:  Donna Murphy    MRN: [id number] DOB: 01-29-1946  05/17/2019  Donna Murphy was observed post Covid-19 immunization for  15 minutes without incidence. She was provided with Vaccine Information Sheet and instruction to access the V-Safe system.   Donna Murphy was instructed to call 911 with any severe reactions post vaccine: [name] Difficulty breathing  . Swelling of your face and throat  . A fast heartbeat  . A bad rash all over your body  . Dizziness and weakness    Immunizations Administered    Name Date Dose VIS Date Route   Pfizer COVID-19 Vaccine 05/17/2019 10:39 AM 0.3 mL 03/14/2019 Intramuscular   Manufacturer: [place]   Lot: [id number]   [place]: [id number]

## 2019-05-19 MED FILL — GLIMEPIRIDE 4 MG TABLET: 4 | 30 days supply | Qty: 60 | Fill #0

## 2019-05-26 MED FILL — metFORMIN HCL ER 500 MG TB2: 500 | 60 days supply | Qty: 120 | Fill #0

## 2019-05-26 MED FILL — FARXIGA 10 MG TABLET: 10 | 30 days supply | Qty: 30 | Fill #0

## 2019-06-16 MED FILL — GLIMEPIRIDE 4 MG TAB: 4 | 30 days supply | Qty: 60 | Fill #1

## 2019-06-18 ENCOUNTER — Encounter: Payer: Self-pay | Admitting: Family Medicine

## 2019-06-18 NOTE — Telephone Encounter (Signed)
Pt called back and scheduled a doxy with Dr. Einar Pheasant tomorrow

## 2019-06-19 ENCOUNTER — Encounter: Payer: Self-pay | Admitting: Family Medicine

## 2019-06-19 ENCOUNTER — Ambulatory Visit (INDEPENDENT_AMBULATORY_CARE_PROVIDER_SITE_OTHER): Payer: Medicare Other | Admitting: Family Medicine

## 2019-06-19 ENCOUNTER — Other Ambulatory Visit: Payer: Self-pay

## 2019-06-19 ENCOUNTER — Other Ambulatory Visit: Payer: Medicare Other

## 2019-06-19 VITALS — Temp 97.1°F

## 2019-06-19 DIAGNOSIS — R350 Frequency of micturition: Secondary | ICD-10-CM

## 2019-06-19 DIAGNOSIS — R109 Unspecified abdominal pain: Secondary | ICD-10-CM

## 2019-06-19 LAB — POCT URINALYSIS DIPSTICK
Bilirubin, UA: NEGATIVE
Blood, UA: NEGATIVE
Glucose, UA: POSITIVE — AB
Ketones, UA: NEGATIVE
Leukocytes, UA: NEGATIVE
Nitrite, UA: NEGATIVE
Protein, UA: NEGATIVE
Spec Grav, UA: <=1.005 — AB (ref 1.010–1.025)
Urobilinogen, UA: 0.2 E.U./dL
pH, UA: 6 (ref 5.0–8.0)

## 2019-06-19 NOTE — Progress Notes (Signed)
I connected with Donna Murphy on 06/19/19 at 12:00 PM EDT by video and verified that I am speaking with the correct person using two identifiers.   I discussed the limitations, risks, security and privacy concerns of performing an evaluation and management service by video and the availability of in person appointments. I also discussed with the patient that there may be a patient responsible charge related to this service. The patient expressed understanding and agreed to proceed.  Patient location: Home Provider Location: [facility] Participants: Donna Murphy and Aurora Mask   Subjective:     Donna Murphy is a 74 y.o. female presenting for Urinary Frequency (stomach cramps, had temp of around 99, body aches. Sx started 2 days ago.)     Urinary Frequency  Associated symptoms include frequency. Pertinent negatives include no nausea or vomiting.    #Hx of bladder infection - typical symptoms - low grade temperature, stomach cramps relieved by BM, - drinks a lot of water and uses the bathroom frequently - no hx of pain with urination  Drinking more water per day 6-8 glasses of water Has noticed her blood sugar has been a little higher - <120 initially with the diet but today 150  Denies dysuria, urgency Endorses frequency  Notes overall is feeling better today  Did get both doses of covid vaccine Friday went to visit kids/grandkids (they had previously all had Covid) - one of them was having some stomach issues and stayed home from school -- 2 days later she had diarrhea but otherwise felt ok, no other symptoms  Endorses allergy symptoms - taking allergy medications w/ improvement   Review of Systems  Constitutional: Positive for fever.  HENT: Positive for rhinorrhea. Negative for sore throat.   Eyes: Positive for itching.  Respiratory: Negative for shortness of breath.   Cardiovascular: Negative for chest pain.  Gastrointestinal: Positive for abdominal  pain. Negative for constipation, diarrhea, nausea and vomiting.  Genitourinary: Positive for frequency. Negative for dysuria.  Musculoskeletal: Positive for arthralgias and myalgias (during temperature).     Social History   Tobacco Use  Smoking Status Never Smoker  Smokeless Tobacco Never Used        Objective:   BP Readings from Last 3 Encounters:  04/09/19 132/76  02/18/19 132/80  06/03/18 (!) 142/80   Wt Readings from Last 3 Encounters:  04/09/19 138 lb 8 oz (62.8 kg)  02/18/19 137 lb 6 oz (62.3 kg)  06/03/18 141 lb 3 oz (64 kg)    Temp (!) 97.1 F (36.2 C) Comment: per patient   Physical Exam Constitutional:      General: She is not in acute distress.    Appearance: She is well-developed. She is not diaphoretic.  HENT:     Right Ear: External ear normal.     Left Ear: External ear normal.     Nose: Nose normal.  Eyes:     Conjunctiva/sclera: Conjunctivae normal.  Cardiovascular:     Rate and Rhythm: Normal rate.  Pulmonary:     Effort: Pulmonary effort is normal.  Musculoskeletal:     Cervical back: Neck supple.  Skin:    General: Skin is warm and dry.     Capillary Refill: Capillary refill takes less than 2 seconds.  Neurological:     Mental Status: She is alert. Mental status is at baseline.  Psychiatric:        Mood and Affect: Mood normal.  Behavior: Behavior normal.       UA: + glucose, negative nitrites and negative LE     Assessment & Plan:   Problem List Items Addressed This Visit    None    Visit Diagnoses    Urinary frequency    -  Primary   Relevant Orders   POCT urinalysis dipstick (Completed)   Abdominal cramping         Discussed that UA w/o signs infection. Suspect viral syndrome. Encouraged Covid testing even though lower risk to rule out - but patient declined as symptoms already improving. Offered sending for culture but she declined since symptoms improving. Will watch and wait and return if symptoms  worsen  Return if symptoms worsen or fail to improve.  Donna Noe, MD

## 2019-06-27 MED FILL — FARXIGA 10 MG TABLET: 10 | 30 days supply | Qty: 30 | Fill #1

## 2019-06-30 MED FILL — metFORMIN HCL ER 500 MG TB2: 500 | 90 days supply | Qty: 360 | Fill #0

## 2019-07-15 DIAGNOSIS — E1165 Type 2 diabetes mellitus with hyperglycemia: Secondary | ICD-10-CM | POA: Diagnosis not present

## 2019-07-15 DIAGNOSIS — E7800 Pure hypercholesterolemia, unspecified: Secondary | ICD-10-CM | POA: Diagnosis not present

## 2019-07-15 DIAGNOSIS — I10 Essential (primary) hypertension: Secondary | ICD-10-CM | POA: Diagnosis not present

## 2019-12-30 DIAGNOSIS — Z23 Encounter for immunization: Secondary | ICD-10-CM | POA: Diagnosis not present

## 2020-01-31 ENCOUNTER — Other Ambulatory Visit: Payer: Self-pay | Admitting: Family Medicine

## 2020-02-04 ENCOUNTER — Encounter: Payer: Self-pay | Admitting: Family Medicine

## 2020-03-08 DIAGNOSIS — E1165 Type 2 diabetes mellitus with hyperglycemia: Secondary | ICD-10-CM | POA: Diagnosis not present

## 2020-03-08 DIAGNOSIS — R140 Abdominal distension (gaseous): Secondary | ICD-10-CM | POA: Diagnosis not present

## 2020-03-08 DIAGNOSIS — Z8379 Family history of other diseases of the digestive system: Secondary | ICD-10-CM | POA: Diagnosis not present

## 2020-03-08 DIAGNOSIS — E7800 Pure hypercholesterolemia, unspecified: Secondary | ICD-10-CM | POA: Diagnosis not present

## 2020-03-08 DIAGNOSIS — I10 Essential (primary) hypertension: Secondary | ICD-10-CM | POA: Diagnosis not present

## 2020-03-19 ENCOUNTER — Other Ambulatory Visit: Payer: Self-pay | Admitting: Family Medicine

## 2020-03-19 NOTE — Telephone Encounter (Signed)
No recent or future appts. Please advise

## 2020-03-19 NOTE — Telephone Encounter (Signed)
Please schedule f/u and refill until then  

## 2020-03-19 NOTE — Telephone Encounter (Signed)
Med refilled once and Carrie will reach out to pt to try and get appt scheduled  

## 2020-03-20 ENCOUNTER — Other Ambulatory Visit: Payer: Self-pay | Admitting: Family Medicine

## 2020-04-13 ENCOUNTER — Encounter: Payer: Self-pay | Admitting: Family Medicine

## 2020-04-13 ENCOUNTER — Ambulatory Visit (INDEPENDENT_AMBULATORY_CARE_PROVIDER_SITE_OTHER): Payer: Medicare Other | Admitting: Family Medicine

## 2020-04-13 ENCOUNTER — Other Ambulatory Visit: Payer: Self-pay

## 2020-04-13 VITALS — BP 136/84 | HR 94 | Temp 97.5°F | Ht 63.25 in | Wt 135.4 lb

## 2020-04-13 DIAGNOSIS — I10 Essential (primary) hypertension: Secondary | ICD-10-CM

## 2020-04-13 DIAGNOSIS — E1169 Type 2 diabetes mellitus with other specified complication: Secondary | ICD-10-CM

## 2020-04-13 DIAGNOSIS — E119 Type 2 diabetes mellitus without complications: Secondary | ICD-10-CM | POA: Diagnosis not present

## 2020-04-13 DIAGNOSIS — K219 Gastro-esophageal reflux disease without esophagitis: Secondary | ICD-10-CM

## 2020-04-13 DIAGNOSIS — E785 Hyperlipidemia, unspecified: Secondary | ICD-10-CM

## 2020-04-13 MED ORDER — LOSARTAN POTASSIUM 25 MG PO TABS
50.0000 mg | ORAL_TABLET | Freq: Every day | ORAL | 3 refills | Status: DC
Start: 2020-04-13 — End: 2021-06-10

## 2020-04-13 MED ORDER — ATORVASTATIN CALCIUM 40 MG PO TABS
40.0000 mg | ORAL_TABLET | Freq: Every day | ORAL | 3 refills | Status: DC
Start: 2020-04-13 — End: 2021-06-09

## 2020-04-13 MED ORDER — OMEPRAZOLE 20 MG PO CPDR
DELAYED_RELEASE_CAPSULE | ORAL | 3 refills | Status: DC
Start: 2020-04-13 — End: 2021-07-12

## 2020-04-13 MED ORDER — FAMOTIDINE 20 MG PO TABS: 20.0000 mg | ORAL_TABLET | Freq: Every day | ORAL | 3 refills | Status: DC

## 2020-04-13 NOTE — Assessment & Plan Note (Signed)
Per pt fairly well controlled -under care of endocrinology /Dr Chalmers Cater Taking amaryl and Dollene Donna Murphy and metformin  May have to change the metformin due to GI upset  Plans to schedule her eye exam at [facility] for lab results  Last a1c 6.5 (good)  Taking statin and arb  Nl foot exam

## 2020-04-13 NOTE — Progress Notes (Signed)
Subjective:    Patient ID: Donna Murphy, female    DOB: 04-24-1945, 75 y.o.   MRN: [id number]  This visit occurred during the SARS-CoV-2 public health emergency.  Safety protocols were in place, including screening questions prior to the visit, additional usage of staff PPE, and extensive cleaning of exam room while observing appropriate contact time as indicated for disinfecting solutions.    HPI Pt presents for f/u of chronic health problems   Wt Readings from Last 3 Encounters:  04/13/20 135 lb 6 oz (61.4 kg)  04/09/19 138 lb 8 oz (62.8 kg)  02/18/19 137 lb 6 oz (62.3 kg)   23.79 kg/m   Has been feeling pretty good overall  Ate a little worse around holiday   Husband has mult myeloma -had come back  This has been tough   HTN bp is stable today  No cp or palpitations or headaches or edema  No side effects to medicines  BP Readings from Last 3 Encounters:  04/13/20 136/84  04/09/19 132/76  02/18/19 132/80     Losartan 25 mg daily   Pulse Readings from Last 3 Encounters:  04/13/20 94  04/09/19 86  02/18/19 78   Hyperlipidemia  Lab Results  Component Value Date   CHOL 148 02/13/2019   HDL 53.80 02/13/2019   LDLCALC 71 02/13/2019   TRIG 115.0 02/13/2019   CHOLHDL 3 02/13/2019   Atorvastatin 40 mg daily  Dr Talmage Nap checked her cholesterol   Lab Results  Component Value Date   CREATININE 0.74 02/13/2019   BUN 17 02/13/2019   NA 142 02/13/2019   K 4.3 02/13/2019   CL 107 02/13/2019   CO2 28 02/13/2019    DM2  Sees Dr Talmage Nap -last a1c 6.5  Eating better (lower processed carbs and dairy)- called Reuel Boom plan  amaryl farxiga januvia  Metformin   Some gas/bloating and was tested neg for celiac ? If metformin side eff  Wondered if she needs to increase omeprazole    Eye exam -it is been over a year  [facility]  She can make her own appt  Declines labs today- Dr Talmage Nap drew her (took lots of blood)   Patient Active Problem List   Diagnosis  Date Noted  . Hyperlipidemia associated with type 2 diabetes mellitus ([facility]) 02/18/2019  . Screening mammogram, encounter for 02/18/2019  . Malignant neoplasm of ascending colon ([facility]) 06/03/2018  . History of colon polyps 05/24/2017  . Hx of colonic polyps 03/01/2016  . Encounter for Medicare annual wellness exam 11/12/2013  . Other screening mammogram 05/24/2011  . Gynecological examination 05/24/2011  . DM (diabetes mellitus), type 2 ([facility]) 12/04/2006  . Essential hypertension 07/12/2006  . VARICOSE VEIN 07/12/2006  . ALLERGIC RHINITIS 07/12/2006  . GERD 07/12/2006  . IRRITABLE BOWEL SYNDROME 07/12/2006   Past Medical History:  Diagnosis Date  . Allergic rhinitis   . Allergy   . DMII (diabetes mellitus, type 2) ([facility])   . Fibroids    uterine  . GERD (gastroesophageal reflux disease)   . HLD (hyperlipidemia)   . HTN (hypertension)   . Hx of colonic polyps 03/01/2016  . Irritable bowel syndrome   . Kidney stone    passed stone-no surgery required  . Skin cancer of lip    basal cell  . Varices of other sites    Past Surgical History:  Procedure Laterality Date  . COLONOSCOPY  9/07   negative - gessner  . ENDOMETRIAL BIOPSY  10/01   negative  . TONSILLECTOMY    . TOOTH EXTRACTION     Social History   Tobacco Use  . Smoking status: Never Smoker  . Smokeless tobacco: Never Used  Substance Use Topics  . Alcohol use: Yes    Alcohol/week: 7.0 - 10.0 standard drinks    Types: 7 - 10 Glasses of wine per week    Comment: occ  . Drug use: No   Family History  Problem Relation Age of Onset  . Coronary artery disease Mother   . Hyperlipidemia Mother   . Hypertension Mother   . Osteoporosis Mother   . Heart attack Mother        CABG in her 80's  . Diabetes Father   . Heart failure Father   . Hypertension Father   . Transient ischemic attack Father   . Diabetes Unknown        GF  . Hypertension Brother   . Heart murmur Son   . Brain cancer Sister   . Colon cancer  Neg Hx   . Esophageal cancer Neg Hx   . Stomach cancer Neg Hx   . Rectal cancer Neg Hx    Allergies  Allergen Reactions  . Ciprofloxacin     Arm numbness  . Sulfonamide Derivatives     Hives, rash   Current Outpatient Medications on File Prior to Visit  Medication Sig Dispense Refill  . aspirin 81 MG tablet Take 81 mg by mouth daily.    . cetirizine (ZYRTEC) 10 MG tablet Take 10 mg by mouth daily.    . Cholecalciferol (VITAMIN D) 2000 UNITS tablet Take 2,000 Units by mouth daily.    . Cyanocobalamin (B-12 PO) Take by mouth. Takes occassionally    . FARXIGA 10 MG TABS tablet Take 10 mg by mouth daily.  6  . fluticasone (FLONASE) 50 MCG/ACT nasal spray Place 2 sprays into both nostrils daily.    [name] glimepiride (AMARYL) 4 MG tablet Take 4 mg by mouth 2 (two) times daily with a meal.    . glucose blood (ONETOUCH VERIO) test strip Ck blood sugar once daily and as directed. Dx E11.9 100 each 1  . JANUVIA 100 MG tablet TAKE 1 TABLET BY MOUTH ONCE DAILY FOR 30 DAYS  5  . Krill Oil 300 MG CAPS Take 1 capsule by mouth daily.    . metFORMIN (GLUCOPHAGE) 500 MG tablet Take 1,000 mg by mouth 2 (two) times daily with a meal.    . Multiple Vitamin (MULTIVITAMIN) tablet Take 1 tablet by mouth daily.     No current facility-administered medications on file prior to visit.     Review of Systems  Constitutional: Positive for fatigue. Negative for activity change, appetite change, fever and unexpected weight change.  HENT: Negative for congestion, ear pain, rhinorrhea, sinus pressure and sore throat.   Eyes: Negative for pain, redness and visual disturbance.  Respiratory: Negative for cough, shortness of breath and wheezing.   Cardiovascular: Negative for chest pain and palpitations.  Gastrointestinal: Negative for abdominal pain, blood in stool, constipation and diarrhea.       Indigestion at night   Endocrine: Negative for polydipsia and polyuria.  Genitourinary: Negative for dysuria, frequency  and urgency.  Musculoskeletal: Negative for arthralgias, back pain and myalgias.       Occ nocturnal leg cramps   Skin: Negative for pallor and rash.  Allergic/Immunologic: Negative for environmental allergies.  Neurological: Negative for dizziness, syncope and headaches.  Hematological:  Negative for adenopathy. Does not bruise/bleed easily.  Psychiatric/Behavioral: Positive for sleep disturbance. Negative for decreased concentration and dysphoric mood. The patient is not nervous/anxious.        Objective:   Physical Exam Constitutional:      General: She is not in acute distress.    Appearance: Normal appearance. She is well-developed, normal weight and well-nourished. She is not ill-appearing.  HENT:     Head: Normocephalic and atraumatic.     Mouth/Throat:     Mouth: Oropharynx is clear and moist.  Eyes:     General: No scleral icterus.    Extraocular Movements: EOM normal.     Conjunctiva/sclera: Conjunctivae normal.     Pupils: Pupils are equal, round, and reactive to light.  Neck:     Thyroid: No thyromegaly.     Vascular: No carotid bruit or JVD.  Cardiovascular:     Rate and Rhythm: Regular rhythm. Tachycardia present.     Pulses: Intact distal pulses.     Heart sounds: Normal heart sounds. No gallop.   Pulmonary:     Effort: Pulmonary effort is normal. No respiratory distress.     Breath sounds: Normal breath sounds. No wheezing or rales.     Comments: No crackles Abdominal:     General: Bowel sounds are normal. There is no distension or abdominal bruit.     Palpations: Abdomen is soft. There is no mass.     Tenderness: There is no abdominal tenderness.  Musculoskeletal:        General: No edema.     Cervical back: Normal range of motion and neck supple.     Right lower leg: No edema.     Left lower leg: No edema.  Lymphadenopathy:     Cervical: No cervical adenopathy.  Skin:    General: Skin is warm and dry.     Coloration: Skin is not pale.     Findings:  No rash.  Neurological:     Mental Status: She is alert.     Sensory: No sensory deficit.     Coordination: Coordination normal.     Deep Tendon Reflexes: Reflexes are normal and symmetric. Reflexes normal.  Psychiatric:        Mood and Affect: Mood and affect and mood normal.        Cognition and Memory: Cognition and memory normal.           Assessment & Plan:   Problem List Items Addressed This Visit      Cardiovascular and Mediastinum   Essential hypertension - Primary    bp in fair control at this time  BP Readings from Last 1 Encounters:  04/13/20 136/84   No changes needed Most recent labs reviewed  Disc lifstyle change with low sodium diet and exercise  Plan to continue losartan 25 mg daily  Sent for last labs from endocrinology      Relevant Medications   losartan (COZAAR) 25 MG tablet   atorvastatin (LIPITOR) 40 MG tablet     Digestive   GERD    Worse lately at bedtime/interfering with sleep  Taking omeprazole 20 mg daily  Will add pepcid 20 mg at bedtime and see if this helps along with watching diet  Stress may also be a trigger  Noted due ppi status-continue to supplement B12 and vit D Magnesium would be useful but would exacerbate diarrhea        Relevant Medications   omeprazole (PRILOSEC) 20 MG capsule  famotidine (PEPCID) 20 MG tablet     Endocrine   DM (diabetes mellitus), type 2 ([place])    Per pt fairly well controlled -under care of endocrinology /Dr Chalmers Cater Taking amaryl and Dollene [place] and metformin  May have to change the metformin due to GI upset  Plans to schedule her eye exam at [facility] for lab results  Last a1c 6.5 (good)  Taking statin and arb  Nl foot exam      Relevant Medications   losartan (COZAAR) 25 MG tablet   atorvastatin (LIPITOR) 40 MG tablet   Hyperlipidemia associated with type 2 diabetes mellitus ([place])    Has been well controlled with atorvastatin 40 mg daily  Sent for last lipid panel from Dr  Chalmers Cater  Goal LDL at or under 70  Disc goals for lipids and reasons to control them Rev last labs with pt -last here 11/20  Rev low sat fat diet in detail       Relevant Medications   losartan (COZAAR) 25 MG tablet   atorvastatin (LIPITOR) 40 MG tablet

## 2020-04-13 NOTE — Patient Instructions (Addendum)
Please schedule your annual diabetic eye exam and ask them to send [place] a copy   Schedule a mammogram please when you can   We will send for labs from Dr Chalmers Cater   A teaspoon of mustard daily may help your leg cramps  Also foods high in magnesium  Stretching helps also   Get pepcid 20 mg at bedtime to see if this helps with reflux  Continue prilosec   Continue vitamin D at least 2000 iu daily  B12  500 to 1000 mcg daily

## 2020-04-13 NOTE — Assessment & Plan Note (Signed)
Worse lately at bedtime/interfering with sleep  Taking omeprazole 20 mg daily  Will add pepcid 20 mg at bedtime and see if this helps along with watching diet  Stress may also be a trigger  Noted due ppi status-continue to supplement B12 and vit D Magnesium would be useful but would exacerbate diarrhea

## 2020-04-13 NOTE — Assessment & Plan Note (Signed)
bp in fair control at this time  BP Readings from Last 1 Encounters:  04/13/20 136/84   No changes needed Most recent labs reviewed  Disc lifstyle change with low sodium diet and exercise  Plan to continue losartan 25 mg daily  Sent for last labs from endocrinology

## 2020-04-13 NOTE — Assessment & Plan Note (Signed)
Has been well controlled with atorvastatin 40 mg daily  Sent for last lipid panel from Dr Chalmers Cater  Goal LDL at or under 70  Disc goals for lipids and reasons to control them Rev last labs with pt -last here 11/20  Rev low sat fat diet in detail

## 2020-06-30 ENCOUNTER — Telehealth: Payer: Self-pay | Admitting: Family Medicine

## 2020-06-30 NOTE — Chronic Care Management (AMB) (Signed)
  Chronic Care Management   Note  06/30/2020 Name: MILANI LOWENSTEIN MRN: [id number] DOB: 02/05/1946  NIDA MANFREDI is a 75 y.o. year old female who is a primary care patient of Tower, [name], MD. I reached out to Aurora Mask by phone today in response to a referral sent by Ms. Doylene Canning Baldyga's PCP, Tower, [name], MD.   Ms. Greenawalt was given information about Chronic Care Management services today including:  1. CCM service includes personalized support from designated clinical staff supervised by her physician, including individualized plan of care and coordination with other care providers 2. 24/7 contact phone numbers for assistance for urgent and routine care needs. 3. Service will only be billed when office clinical staff spend 20 minutes or more in a month to coordinate care. 4. Only one practitioner may furnish and bill the service in a calendar month. 5. The patient may stop CCM services at any time (effective at the end of the month) by phone call to the office staff.   Patient did not agree to enrollment in care management services and does not wish to consider at this time.  Follow up plan:   Lauretta Grill Upstream Scheduler

## 2020-07-07 ENCOUNTER — Telehealth: Payer: Self-pay | Admitting: Family Medicine

## 2020-07-07 NOTE — Progress Notes (Signed)
  Chronic Care Management   Note  07/07/2020 Name: LYNANN DEMETRIUS MRN: [id number] DOB: May 16, 1945  JAMAIA BRUM is a 75 y.o. year old female who is a primary care patient of Tower, [name], MD. I reached out to Aurora Mask by phone today in response to a referral sent by Ms. Doylene Canning Vict[place]'s PCP, Tower, [name], MD.   Ms. Gassmann was given information about Chronic Care Management services today including:  1. CCM service includes personalized support from designated clinical staff supervised by her physician, including individualized plan of care and coordination with other care providers 2. 24/7 contact phone numbers for assistance for urgent and routine care needs. 3. Service will only be billed when office clinical staff spend 20 minutes or more in a month to coordinate care. 4. Only one practitioner may furnish and bill the service in a calendar month. 5. The patient may stop CCM services at any time (effective at the end of the month) by phone call to the office staff.   Patient did not agree to enrollment in care management services and does not wish to consider at this time.  Follow up plan:   Lauretta Grill Upstream Scheduler

## 2020-07-07 NOTE — Chronic Care Management (AMB) (Signed)
  Chronic Care Management   Outreach Note  07/07/2020 Name: Donna Murphy MRN: [id number] DOB: Sep 01, 1945  Referred by: Tower, [name], MD Reason for referral : No chief complaint on file.   An unsuccessful telephone outreach was attempted today. The patient was referred to the pharmacist for assistance with care management and care coordination.   Follow Up Plan:   Lauretta Grill Upstream Scheduler

## 2020-08-05 DIAGNOSIS — Z23 Encounter for immunization: Secondary | ICD-10-CM | POA: Diagnosis not present

## 2020-09-06 ENCOUNTER — Telehealth: Payer: Self-pay | Admitting: Family Medicine

## 2020-09-06 NOTE — Telephone Encounter (Signed)
LVM for pt to rtn my call to schedule AWV with [facility].  

## 2020-12-14 DIAGNOSIS — Z23 Encounter for immunization: Secondary | ICD-10-CM | POA: Diagnosis not present

## 2021-03-21 ENCOUNTER — Other Ambulatory Visit: Payer: Self-pay

## 2021-03-21 ENCOUNTER — Encounter: Payer: Self-pay | Admitting: Family Medicine

## 2021-03-21 ENCOUNTER — Telehealth (INDEPENDENT_AMBULATORY_CARE_PROVIDER_SITE_OTHER): Payer: Medicare Other | Admitting: Family Medicine

## 2021-03-21 ENCOUNTER — Telehealth: Payer: Self-pay

## 2021-03-21 ENCOUNTER — Other Ambulatory Visit: Payer: Self-pay | Admitting: Family Medicine

## 2021-03-21 ENCOUNTER — Other Ambulatory Visit: Payer: Medicare Other

## 2021-03-21 VITALS — Temp 98.9°F | Ht 63.25 in | Wt 135.0 lb

## 2021-03-21 DIAGNOSIS — I10 Essential (primary) hypertension: Secondary | ICD-10-CM | POA: Diagnosis not present

## 2021-03-21 DIAGNOSIS — U071 COVID-19: Secondary | ICD-10-CM | POA: Diagnosis not present

## 2021-03-21 DIAGNOSIS — E119 Type 2 diabetes mellitus without complications: Secondary | ICD-10-CM

## 2021-03-21 DIAGNOSIS — J029 Acute pharyngitis, unspecified: Secondary | ICD-10-CM

## 2021-03-21 LAB — POCT RAPID STREP A (OFFICE): Rapid Strep A Screen: NEGATIVE

## 2021-03-21 MED ORDER — NIRMATRELVIR/RITONAVIR (PAXLOVID)TABLET: 3.0000 | ORAL_TABLET | Freq: Two times a day (BID) | ORAL | 0 refills | Status: AC

## 2021-03-21 NOTE — Telephone Encounter (Signed)
[facility] Night - Client TELEPHONE ADVICE RECORD [facility] Patient Name: Flint Melter Gender: Female DOB: May 20, 1945 Age: 75 Y 39 M 18 D Return Phone Number: [phone] (Primary), [phone] (Secondary) Address: City/ State/ Zip: [place]  [address] Client [facility] Night - Client Client Site [place] Provider Tower, Roque Lias - MD Contact Type Call Who Is Calling Patient / Member / Family / Caregiver Call Type Triage / Clinical Relationship To Patient Self Return Phone Number [phone] (Primary) Chief Complaint Headache Reason for Call Symptomatic / Request for [place] states she has COVID. She has a headache and a sore throat. Translation No Nurse Assessment Nurse: Raphael Gibney, RN, Vanita Ingles Date/Time (Eastern Time): 03/19/2021 2:53:30 PM Please select the assessment type ---Verbal order / New medication order Additional Documentation ---Caller wants on call provider paged for antiviral Does the client directives allow for assistance with medications after hours? ---Yes Other current medications? ---Yes List current medications. ---metformin; glipizide; farxiga; januvia; losartan; atorvastatin Medication allergies? ---Yes List medication allergies. ---[place] Pharmacy name and phone number. ---[facility] [address] [place]; [place]; phone: [phone] Does the client directive allow for RN to call in the medication order to the pharmacy? ---Yes Additional Documentation ---advised caller that on call provider will be paged regarding medication. verbalized understanding Nurse: Raphael Gibney, RN, Vanita Ingles Date/Time Eilene Ghazi Time): 03/19/2021 2:40:16 PM Confirm and document reason for call. If symptomatic, describe symptoms. ---Caller states she is positive for COVID about a hr ago. symptoms started yesterday. has earache in both ears, sore throat. coughing due to  sore throat. Temp 99. Does the patient have any new or worsening symptoms? ---Yes Will a triage be completed? ---Yes Related visit to physician within the last 2 weeks? ---No PLEASE NOTE: All timestamps contained within this report are represented as [place] Standard Time. CONFIDENTIALTY NOTICE: This fax transmission is intended only for the addressee. It contains information that is legally privileged, confidential or otherwise protected from use or disclosure. If you are not the intended recipient, you are strictly prohibited from reviewing, disclosing, copying using or disseminating any of this information or taking any action in reliance on or regarding this information. If you have received this fax in error, please notify us immediately by telephone so that we can arrange for its return to [place]. Phone: [phone], Toll-Free: [phone], Fax: [phone] Page: 2 of 3 Call Id: [id number] Nurse Assessment Does the PT have any chronic conditions? (i.e. diabetes, asthma, this includes High risk factors for pregnancy, etc.) ---Yes List chronic conditions. ---type 2 diabetes Is this a behavioral health or substance abuse call? ---No Guidelines Guideline Title Affirmed Question Affirmed Notes Nurse Date/Time (Eastern Time) COVID-19 - Diagnosed or Suspected [1] HIGH RISK for severe COVID complications (e.g., weak immune system, age > 40 years, obesity with BMI 30 or higher, pregnant, chronic lung disease or other chronic medical condition) AND [2] COVID symptoms (e.g., cough, fever) (Exceptions: Already seen by PCP and no new or worsening symptoms.) Raphael Gibney, RN, Vanita Ingles 03/19/2021 2:43:01 PM Disp. Time Eilene Ghazi Time) Disposition Final User 03/19/2021 3:00:29 PM Paged On Call back to [facility], RN, Vanita Ingles 03/19/2021 3:16:43 PM Paged On Call back to [facility], RN, Vanita Ingles 03/19/2021 3:31:15 PM Paged On Call back to [facility], RN, Vanita Ingles 03/19/2021  2:58:34 PM Call PCP within 24 Hours Yes Raphael Gibney, RN, Doreatha Lew Disagree/Comply Comply Caller Understands Yes PreDisposition [place] Advice Given Per Guideline CALL PCP WITHIN  24 HOURS: * You need to discuss this with your doctor (or NP/PA) within the next 24 hours. GENERAL CARE ADVICE FOR COVID-19 SYMPTOMS: * The symptoms are generally treated the same whether you have COVID-19, influenza or some other respiratory virus. * Feeling dehydrated: Drink extra liquids. If the air in your home is dry, use a humidifier. * STAY HOME A MINIMUM OF 5 DAYS: People with MILD COVID-19 can STOP HOME ISOLATION AFTER 5 DAYS if (1) fever has been gone for 24 hours (without using fever medicine) AND (2) symptoms are better. Continue to wear a well-fitted mask PLEASE NOTE: All timestamps contained within this report are represented as [place] Standard Time. CONFIDENTIALTY NOTICE: This fax transmission is intended only for the addressee. It contains information that is legally privileged, confidential or otherwise protected from use or disclosure. If you are not the intended recipient, you are strictly prohibited from reviewing, disclosing, copying using or disseminating any of this information or taking any action in reliance on or regarding this information. If you have received this fax in error, please notify us immediately by telephone so that we can arrange for its return to [place]. Phone: [phone], Toll-Free: [phone], Fax: [phone] Page: 3 of 3 Call Id: [id number] Care Advice Given Per Guideline for a full 10 days when around others. COVID-19 - HOW TO PROTECT OTHERS - WHEN YOU ARE SICK WITH COVID-19: * WEAR A MASK FOR 10 DAYS: Wear a well-fitted mask for 10 full days any time you are around others inside your home or in public. Do not go to places where you are unable to wear a mask. CALL BACK IF: * You become worse Comments User: Dannielle Burn, RN Date/Time Eilene Ghazi Time): 03/19/2021  3:54:48 PM please call pt back regarding prescription for paxlovid Paging DoctorName Phone DateTime Result/ Outcome Message Type Notes Billey Chang- MD [phone] 03/19/2021 3:00:29 PM Called On Call Provider - Left Message Doctor Paged Billey Chang- MD [phone] 03/19/2021 3:16:43 PM Called On Call Provider - Left Message Doctor Paged Billey Chang- MD [phone] 03/19/2021 3:31:15 PM Called On Call Provider - Left Message Doctor Paged Billey Chang- MD 03/19/2021 3:54:26 PM Unable to Reach on call - Max Attempts Message Result Dr. Jonni Sanger has not returned call. Pt notified and verbalized understandin

## 2021-03-21 NOTE — Progress Notes (Signed)
Patient ID: Donna Murphy, female    DOB: 1946/02/19, 75 y.o.   MRN: [id number]  Virtual visit completed through [employer], a video enabled telemedicine application. Due to national recommendations of social distancing due to COVID-19, a virtual visit is felt to be most appropriate for this patient at this time. Reviewed limitations, risks, security and privacy concerns of performing a virtual visit and the availability of in person appointments. I also reviewed that there may be a patient responsible charge related to this service. The patient agreed to proceed.   Patient location: home Provider location: [place] at [facility], office Persons participating in this virtual visit: patient, provider   If any vitals were documented, they were collected by patient at home unless specified below.    Interactive audio and video telecommunications were attempted between myself and Donna Murphy, however failed due to patient having technical difficulties OR patient not having access to video capability.  We continued and completed visit with audio only.  Time: 11:50am - 12:04pm   Temp 98.9 F (37.2 C)    Ht 5' 3.25" (1.607 m)    Wt 135 lb (61.2 kg)    BMI 23.73 kg/m    CC: COVID positive Subjective:   HPI: Donna Murphy is a 75 y.o. female presenting on 03/21/2021 for Covid Positive (C/o ear pain, HA and ST.  Sxs started 03/19/21. Pos home COVID on 03/19/21.  Tried Tylenol/ibuprofen alt, salt water gargle, Neti pot, Nyquil and husband's tramadol 50 mg for ear pain. Latest COVID booster- 12/2020, flu- 12/2020.)   First day of symptoms: 03/18/2021  Tested COVID positive: 03/19/2021  Current symptoms: bilateral ear pain, headache, ST, low grade fever Tmax 99.9, body aches  No: cough, tooth pain, loss of taste/smell, abd pain, nausea, diarrhea, dyspnea or wheezing  Treatments to date: alternating tylenol/ibuprofen, salt water gargles, neti pot, nyquil and tramadol, throat spray and lozenges   Risk factors include: age, T2DM, HTN   COVID vaccination status: Pfizer x5 including bivalent booser 12/2020   Lab Results  Component Value Date   HGBA1C 6.7 05/28/2017        Relevant past medical, surgical, family and social history reviewed and updated as indicated. Interim medical history since our last visit reviewed. Allergies and medications reviewed and updated. Outpatient Medications Prior to Visit  Medication Sig Dispense Refill   aspirin 81 MG tablet Take 81 mg by mouth daily.     atorvastatin (LIPITOR) 40 MG tablet Take 1 tablet (40 mg total) by mouth daily. 90 tablet 3   cetirizine (ZYRTEC) 10 MG tablet Take 10 mg by mouth daily.     Cholecalciferol (VITAMIN D) 2000 UNITS tablet Take 2,000 Units by mouth daily.     Cyanocobalamin (B-12 PO) Take by mouth. Takes occassionally     famotidine (PEPCID) 20 MG tablet Take 1 tablet (20 mg total) by mouth at bedtime. 90 tablet 3   FARXIGA 10 MG TABS tablet Take 10 mg by mouth daily.  6   fluticasone (FLONASE) 50 MCG/ACT nasal spray Place 2 sprays into both nostrils daily.     glimepiride (AMARYL) 4 MG tablet Take 4 mg by mouth 2 (two) times daily with a meal.     glucose blood (ONETOUCH VERIO) test strip Ck blood sugar once daily and as directed. Dx E11.9 100 each 1   JANUVIA 100 MG tablet TAKE 1 TABLET BY MOUTH ONCE DAILY FOR 30 DAYS  5   Krill Oil 300 MG CAPS  Take 1 capsule by mouth daily.     losartan (COZAAR) 25 MG tablet Take 2 tablets (50 mg total) by mouth daily. 180 tablet 3   metFORMIN (GLUCOPHAGE) 500 MG tablet Take 1,000 mg by mouth 2 (two) times daily with a meal.     Multiple Vitamin (MULTIVITAMIN) tablet Take 1 tablet by mouth daily.     omeprazole (PRILOSEC) 20 MG capsule TAKE 1 CAPSULE BY MOUTH EVERY DAY 90 capsule 3   No facility-administered medications prior to visit.     Per HPI unless specifically indicated in ROS section below Review of Systems Objective:  Temp 98.9 F (37.2 C)    Ht 5' 3.25" (1.607 m)     Wt 135 lb (61.2 kg)    BMI 23.73 kg/m   Wt Readings from Last 3 Encounters:  03/21/21 135 lb (61.2 kg)  04/13/20 135 lb 6 oz (61.4 kg)  04/09/19 138 lb 8 oz (62.8 kg)       Physical exam: Gen: alert, NAD, not ill appearing Pulm: speaks in complete sentences without increased work of breathing Psych: normal mood, normal thought content      Lab Results  Component Value Date   CREATININE 0.74 02/13/2019   BUN 17 02/13/2019   NA 142 02/13/2019   K 4.3 02/13/2019   CL 107 02/13/2019   CO2 28 02/13/2019  GFR = 90  Assessment & Plan:   Problem List Items Addressed This Visit     DM (diabetes mellitus), type 2 ([place])   Essential hypertension   COVID-19 virus infection - Primary    Reviewed currently approved EUA treatments.  Reviewed expected course of illness, anticipated course of recovery, as well as red flags to suggest COVID pneumonia and/or to seek urgent in-person care. Reviewed latest CDC isolation/quarantine guidelines.  Encouraged fluids and rest. Reviewed further supportive care measures at home including vit C 500mg  bid, vit D 2000 IU daily, zinc 100mg  daily, tylenol PRN, pepcid 20mg  BID PRN.   Recommend:  Full dose Paxlovid  Paxlovid drug interactions:  1. Atorvastatin (hold while on paxlovid and for 2 days after) 2. flonase - hold while on paxlovid 3. Omeprazole - hold while on paxlovid      Relevant Medications   nirmatrelvir/ritonavir EUA (PAXLOVID) 20 x 150 MG & 10 x 100MG  TABS     Meds ordered this encounter  Medications   nirmatrelvir/ritonavir EUA (PAXLOVID) 20 x 150 MG & 10 x 100MG  TABS    Sig: Take 3 tablets by mouth 2 (two) times daily for 5 days. (Take nirmatrelvir 150 mg two tablets twice daily for 5 days and ritonavir 100 mg one tablet twice daily for 5 days) Patient GFR is 90    Dispense:  30 tablet    Refill:  0   No orders of the defined types were placed in this encounter.   I discussed the assessment and treatment plan with the  patient. The patient was provided an opportunity to ask questions and all were answered. The patient agreed with the plan and demonstrated an understanding of the instructions. The patient was advised to call back or seek an in-person evaluation if the symptoms worsen or if the condition fails to improve as anticipated.  Follow up plan: No follow-ups on file.  Ria Bush, MD

## 2021-03-21 NOTE — Progress Notes (Signed)
Ordering rapid strep for curbside swab

## 2021-03-21 NOTE — Telephone Encounter (Signed)
Per appt notes pt already has video visit with Dr Darnell Level 03/21/21 at 2PM.sending note to Dr Darnell Level and Lattie Haw CMA; will teams Lattie Haw also.

## 2021-03-21 NOTE — Assessment & Plan Note (Addendum)
Reviewed currently approved EUA treatments.  Reviewed expected course of illness, anticipated course of recovery, as well as red flags to suggest COVID pneumonia and/or to seek urgent in-person care. Reviewed latest CDC isolation/quarantine guidelines.  Encouraged fluids and rest. Reviewed further supportive care measures at home including vit C 500mg  bid, vit D 2000 IU daily, zinc 100mg  daily, tylenol PRN, pepcid 20mg  BID PRN.   Recommend:  Full dose Paxlovid  Paxlovid drug interactions:  1. Atorvastatin (hold while on paxlovid and for 2 days after) 2. flonase - hold while on paxlovid 3. Omeprazole - hold while on paxlovid  Will also bring her in curbside for strep swab given significant ST.

## 2021-03-21 NOTE — Telephone Encounter (Signed)
[facility] Night - Client TELEPHONE ADVICE RECORD [facility] Patient Name: Donna Murphy Gender: Female DOB: 01/13/1946 Age: 75 Y 25 M 18 D Return Phone Number: [phone] (Primary), [phone] (Secondary) Address: City/ State/ Zip: [place]  [address] Client [facility] Night - Client Client Site [place] Provider Tower, Roque Lias - MD Contact Type Call Who Is Calling Patient / Member / Family / Caregiver Call Type Triage / Clinical Relationship To Patient Self Return Phone Number [phone] (Primary) Chief Complaint Headache Reason for Call Symptomatic / Request for [place] states she tested positive for COVID. She is diabetic. She has a headache and sore throat. Translation No Disp. Time Eilene Ghazi Time) Disposition Final User 03/19/2021 1:50:58 PM Attempt made - message left Redmond Pulling, RN, Levada Dy 03/19/2021 1:52:13 PM Attempt made - message left Redmond Pulling, RN, Levada Dy 03/19/2021 2:02:51 PM FINAL ATTEMPT MADE - message left Jannet Askew 03/19/2021 2:04:13 PM FINAL ATTEMPT MADE - message left Yes Redmond Pulling, RN, Glenard Haring

## 2021-04-07 ENCOUNTER — Telehealth: Payer: Self-pay | Admitting: Family Medicine

## 2021-04-07 NOTE — Telephone Encounter (Signed)
LVM for pt to rtn my call to schedule AWV with [facility].  

## 2021-04-13 ENCOUNTER — Encounter: Payer: Self-pay | Admitting: Family Medicine

## 2021-04-13 ENCOUNTER — Ambulatory Visit (INDEPENDENT_AMBULATORY_CARE_PROVIDER_SITE_OTHER): Payer: Medicare Other | Admitting: Family Medicine

## 2021-04-13 ENCOUNTER — Other Ambulatory Visit: Payer: Self-pay

## 2021-04-13 VITALS — BP 122/80 | HR 80 | Temp 97.8°F | Ht 63.25 in | Wt 131.0 lb

## 2021-04-13 DIAGNOSIS — E119 Type 2 diabetes mellitus without complications: Secondary | ICD-10-CM | POA: Diagnosis not present

## 2021-04-13 DIAGNOSIS — E785 Hyperlipidemia, unspecified: Secondary | ICD-10-CM | POA: Diagnosis not present

## 2021-04-13 DIAGNOSIS — E1169 Type 2 diabetes mellitus with other specified complication: Secondary | ICD-10-CM | POA: Diagnosis not present

## 2021-04-13 DIAGNOSIS — Z0000 Encounter for general adult medical examination without abnormal findings: Secondary | ICD-10-CM

## 2021-04-13 DIAGNOSIS — I10 Essential (primary) hypertension: Secondary | ICD-10-CM | POA: Diagnosis not present

## 2021-04-13 DIAGNOSIS — Z1231 Encounter for screening mammogram for malignant neoplasm of breast: Secondary | ICD-10-CM

## 2021-04-13 LAB — CBC WITH DIFFERENTIAL/PLATELET
Basophils Absolute: 0 10*3/uL (ref 0.0–0.1)
Basophils Relative: 0.5 % (ref 0.0–3.0)
Eosinophils Absolute: 0.1 10*3/uL (ref 0.0–0.7)
Eosinophils Relative: 1.7 % (ref 0.0–5.0)
HCT: 42.9 % (ref 36.0–46.0)
Hemoglobin: 13.8 g/dL (ref 12.0–15.0)
Lymphocytes Relative: 25.8 % (ref 12.0–46.0)
Lymphs Abs: 1.5 10*3/uL (ref 0.7–4.0)
MCHC: 32.3 g/dL (ref 30.0–36.0)
MCV: 88.9 fl (ref 78.0–100.0)
Monocytes Absolute: 0.4 10*3/uL (ref 0.1–1.0)
Monocytes Relative: 6.5 % (ref 3.0–12.0)
Neutro Abs: 3.9 10*3/uL (ref 1.4–7.7)
Neutrophils Relative %: 65.5 % (ref 43.0–77.0)
Platelets: 362 10*3/uL (ref 150.0–400.0)
RBC: 4.82 Mil/uL (ref 3.87–5.11)
RDW: 14.4 % (ref 11.5–15.5)
WBC: 5.9 10*3/uL (ref 4.0–10.5)

## 2021-04-13 LAB — LIPID PANEL
Cholesterol: 149 mg/dL (ref 0–200)
HDL: 52.5 mg/dL (ref >39.00)
LDL Cholesterol: 73 mg/dL (ref 0–99)
NonHDL: 96.06
Total CHOL/HDL Ratio: 3
Triglycerides: 113 mg/dL (ref 0.0–149.0)
VLDL: 22.6 mg/dL (ref 0.0–40.0)

## 2021-04-13 LAB — COMPREHENSIVE METABOLIC PANEL
ALT: 22 U/L (ref 0–35)
AST: 19 U/L (ref 0–37)
Albumin: 4.5 g/dL (ref 3.5–5.2)
Alkaline Phosphatase: 63 U/L (ref 39–117)
BUN: 22 mg/dL (ref 6–23)
CO2: 28 mEq/L (ref 19–32)
Calcium: 9.8 mg/dL (ref 8.4–10.5)
Chloride: 103 mEq/L (ref 96–112)
Creatinine, Ser: 0.76 mg/dL (ref 0.40–1.20)
GFR: 77.22 mL/min (ref >60.00)
Glucose, Bld: 99 mg/dL (ref 70–99)
Potassium: 4.2 mEq/L (ref 3.5–5.1)
Sodium: 139 mEq/L (ref 135–145)
Total Bilirubin: 0.5 mg/dL (ref 0.2–1.2)
Total Protein: 7 g/dL (ref 6.0–8.3)

## 2021-04-13 LAB — TSH: TSH: 0.53 u[IU]/mL (ref 0.35–5.50)

## 2021-04-13 NOTE — Patient Instructions (Addendum)
Keep stretching  Use ice on your leg/hip   Get your eye exam   If you want to update your advance directive- look at the blue form    If you are interested in the new shingles vaccine (Shingrix) - call your local pharmacy to check on coverage and availability  If affordable, get on a wait list at your pharmacy to get the vaccine.     Call and schedule your mammogram at the breast center on [address]    Please call the location of your choice from the menu below to schedule your Mammogram and/or Bone Density appointment.    [facility]                      Phone:  [phone], [place] [address]                                                             Services: Traditional and 3D Mammogram, [place] Bone Density                 Phone: [phone] [address], [place] [address]    Service: Bone Density ONLY   *this site does NOT perform mammograms  [place]                        Phone:  [phone] [address], [place] [address]                                            Services:  3D Mammogram and [place] at [facility]   Phone:  [phone]   [place],  [address]  Services: 3D Mammogram and Bone Density  [place] at [facility])  Phone:  [phone]   [address] Room [place], [place] [address]                                              Services:  3D Mammogram and Bone Density

## 2021-04-13 NOTE — Assessment & Plan Note (Signed)
Disc goals for lipids and reasons to control them Rev last labs with pt Rev low sat fat diet in detail  Plan to continue atorvastatin 40 mg daily  Labs ordered

## 2021-04-13 NOTE — Assessment & Plan Note (Signed)
Reviewed health habits including diet and exercise and skin cancer prevention Reviewed appropriate screening tests for age  Also reviewed health mt list, fam hx and immunization status , as well as social and family history   See HPI Labs ordered  Pt plans to schedule eye exam with Dr Champ Mungo to consider shingrix if her ins covers it  utd colonoscopy  Mammogram ordered-pt will schedule  dexa utd/normal BMD No falls or fractures Advance directive is up to date  No cognitive concerns  PHQ score of 0 (discussed grief after loss of husband) No help needed for ADLs  Excellent functionality

## 2021-04-13 NOTE — Progress Notes (Signed)
Subjective:    Patient ID: MANNA GOSE, female    DOB: 1945/12/30, 76 y.o.   MRN: [id number]  This visit occurred during the SARS-CoV-2 public health emergency.  Safety protocols were in place, including screening questions prior to the visit, additional usage of staff PPE, and extensive cleaning of exam room while observing appropriate contact time as indicated for disinfecting solutions.   HPI Pt presents for amw and annual f/u of chronic health problems   I have personally reviewed the Medicare Annual Wellness questionnaire and have noted 1. The patient's medical and social history 2. Their use of alcohol, tobacco or illicit drugs 3. Their current medications and supplements 4. The patient's functional ability including ADL's, fall risks, home safety risks and hearing or visual             impairment. 5. Diet and physical activities 6. Evidence for depression or mood disorders  The patients weight, height, BMI have been recorded in the chart and visual acuity is per eye clinic.  I have made referrals, counseling and provided education to the patient based review of the above and I have provided the pt with a written personalized care plan for preventive services. Reviewed and updated provider list, see scanned forms.  See scanned forms.  Routine anticipatory guidance given to patient.  See health maintenance. Colon cancer screening  colonoscopy 02/2018 with 5 y recall,(may not have to)  personal h/o high grade dysplasia in the past  Breast cancer screening   mammogram 03/2017  Self breast exam- no lumps Flu vaccine- fall  Covid booster bivalent-fall  Tetanus vaccine  Tdap 01/2018 Pneumovax up to date Zoster vaccine Dexa 03/2017 -normal BMD Falls-none Fractures-none  Supplements-vit D  Exercise - walking    Advance directive- is updated  Cognitive function addressed- see scanned forms- and if abnormal then additional documentation follows.   Sometimes forgets names, they  come to her later  Occ misplaces thing  Does her finances Working on estate    PMH and [place] reviewed  Meds, vitals, and allergies reviewed.   ROS: See HPI.  Otherwise negative.    Weight : Wt Readings from Last 3 Encounters:  04/13/21 131 lb (59.4 kg)  03/21/21 135 lb (61.2 kg)  04/13/20 135 lb 6 oz (61.4 kg)   23.02 kg/m Less appetite   Had covid in December  Staying very busy  Walks every day for exercise    Bought new shoes after she injured a hip  Hearing/vision: Hearing Screening   500Hz  1000Hz  2000Hz  4000Hz   Right ear 40 40 20 20  Left ear 40 40 20 20   Vision Screening   Right eye Left eye Both eyes  Without correction     With correction 20/30 20/25 20/25     Due for eye exam from Dr Sabra Heck   PHQ: Depression screen [facility] 2/9 04/13/2020 02/18/2019 01/01/2018 11/27/2016 11/22/2015  Decreased Interest 0 0 0 0 0  Down, Depressed, Hopeless 0 0 0 0 0  PHQ - 2 Score 0 0 0 0 0  Altered sleeping 1 - - 1 -  Tired, decreased energy 1 - - 1 -  Change in appetite 0 - - 0 -  Feeling bad or failure about yourself  0 - - 0 -  Trouble concentrating 0 - - 0 -  Moving slowly or fidgety/restless 0 - - 0 -  Suicidal thoughts 0 - - 0 -  PHQ-9 Score 2 - - 2 -  Difficult doing  work/chores - - - Not difficult at all -   Husband died in Mar 09, 2023  Has good support- kids  Son lives in [place] and she sees him frequently   Has a ministry at [employer] that is helpful in terms of counseling   ADLs: no help needed  Functionality:excellent  Care team : Elford Evilsizer-pcp [place]-opt Balan-endocrinology   HTN bp is stable today  No cp or palpitations or headaches or edema  No side effects to medicines  BP Readings from Last 3 Encounters:  04/13/21 122/80  04/13/20 136/84  04/09/19 132/76      Losartan 25 mg daily   DM2 Sees Dr Balan/endocrinology Overdue for f/u-has appt Tuesday and glucose has been high   Eye exam-pt plans to schedule with Dr Sabra Heck  Takes statin and  arb  Hyperlipidemia Lab Results  Component Value Date   CHOL 148 02/13/2019   HDL 53.80 02/13/2019   LDLCALC 71 02/13/2019   TRIG 115.0 02/13/2019   CHOLHDL 3 02/13/2019   Takes atoravastatin 40 mg daily   Tea, banana Titus Mould    Had cholesterol at Dr Almetta Lovely office  Also had a celiac test-negative   Patient Active Problem List   Diagnosis Date Noted   Hyperlipidemia associated with type 2 diabetes mellitus ([place]) 02/18/2019   Screening mammogram, encounter for 02/18/2019   Malignant neoplasm of ascending colon ([place]) 06/03/2018   History of colon polyps 05/24/2017   Hx of colonic polyps 03/01/2016   Encounter for Medicare annual wellness exam 11/12/2013   Other screening mammogram 05/24/2011   Gynecological examination 05/24/2011   DM (diabetes mellitus), type 2 ([place]) 12/04/2006   Essential hypertension 07/12/2006   VARICOSE VEIN 07/12/2006   ALLERGIC RHINITIS 07/12/2006   GERD 07/12/2006   IRRITABLE BOWEL SYNDROME 07/12/2006   Past Medical History:  Diagnosis Date   Allergic rhinitis    Allergy    DMII (diabetes mellitus, type 2) ([place])    Fibroids    uterine   GERD (gastroesophageal reflux disease)    HLD (hyperlipidemia)    HTN (hypertension)    Hx of colonic polyps 03/01/2016   Irritable bowel syndrome    Kidney stone    passed stone-no surgery required   Skin cancer of lip    basal cell   Varices of other sites    Past Surgical History:  Procedure Laterality Date   COLONOSCOPY  9/07   negative - gessner   ENDOMETRIAL BIOPSY  10/01   negative   TONSILLECTOMY     TOOTH EXTRACTION     Social History   Tobacco Use   Smoking status: Never   Smokeless tobacco: Never  Substance Use Topics   Alcohol use: Yes    Alcohol/week: 7.0 - 10.0 standard drinks    Types: 7 - 10 Glasses of wine per week    Comment: occ   Drug use: No   Family History  Problem Relation Age of Onset   Coronary artery disease Mother    Hyperlipidemia Mother    Hypertension  Mother    Osteoporosis Mother    Heart attack Mother        CABG in her 59's   Diabetes Father    Heart failure Father    Hypertension Father    Transient ischemic attack Father    Diabetes Unknown        GF   Hypertension Brother    Heart murmur Son    Brain cancer Sister    Colon cancer Neg Hx  Esophageal cancer Neg Hx    Stomach cancer Neg Hx    Rectal cancer Neg Hx    Allergies  Allergen Reactions   Ciprofloxacin     Arm numbness   Sulfonamide Derivatives     Hives, rash   Current Outpatient Medications on File Prior to Visit  Medication Sig Dispense Refill   aspirin 81 MG tablet Take 81 mg by mouth daily.     atorvastatin (LIPITOR) 40 MG tablet Take 1 tablet (40 mg total) by mouth daily. 90 tablet 3   cetirizine (ZYRTEC) 10 MG tablet Take 10 mg by mouth daily.     Cholecalciferol (VITAMIN D) 2000 UNITS tablet Take 2,000 Units by mouth daily.     famotidine (PEPCID) 20 MG tablet Take 1 tablet (20 mg total) by mouth at bedtime. 90 tablet 3   FARXIGA 10 MG TABS tablet Take 10 mg by mouth daily.  6   fluticasone (FLONASE) 50 MCG/ACT nasal spray Place 2 sprays into both nostrils daily.     glimepiride (AMARYL) 4 MG tablet Take 4 mg by mouth 2 (two) times daily with a meal.     glucose blood (ONETOUCH VERIO) test strip Ck blood sugar once daily and as directed. Dx E11.9 100 each 1   JANUVIA 100 MG tablet TAKE 1 TABLET BY MOUTH ONCE DAILY FOR 30 DAYS  5   Krill Oil 300 MG CAPS Take 1 capsule by mouth daily.     losartan (COZAAR) 25 MG tablet Take 2 tablets (50 mg total) by mouth daily. 180 tablet 3   metFORMIN (GLUCOPHAGE) 500 MG tablet Take 1,000 mg by mouth 2 (two) times daily with a meal.     Multiple Vitamin (MULTIVITAMIN) tablet Take 1 tablet by mouth daily.     omeprazole (PRILOSEC) 20 MG capsule TAKE 1 CAPSULE BY MOUTH EVERY DAY 90 capsule 3   No current facility-administered medications on file prior to visit.      Review of Systems     Objective:   Physical  Exam Constitutional:      General: She is not in acute distress.    Appearance: Normal appearance. She is well-developed and normal weight. She is not ill-appearing or diaphoretic.  HENT:     Head: Normocephalic and atraumatic.     Right Ear: Tympanic membrane, ear canal and external ear normal.     Left Ear: Tympanic membrane, ear canal and external ear normal.     Nose: Nose normal. No congestion.     Mouth/Throat:     Mouth: Mucous membranes are moist.     Pharynx: Oropharynx is clear. No posterior oropharyngeal erythema.  Eyes:     General: No scleral icterus.    Extraocular Movements: Extraocular movements intact.     Conjunctiva/sclera: Conjunctivae normal.     Pupils: Pupils are equal, round, and reactive to light.  Neck:     Thyroid: No thyromegaly.     Vascular: No carotid bruit or JVD.  Cardiovascular:     Rate and Rhythm: Normal rate and regular rhythm.     Pulses: Normal pulses.     Heart sounds: Normal heart sounds.    No gallop.  Pulmonary:     Effort: Pulmonary effort is normal. No respiratory distress.     Breath sounds: Normal breath sounds. No wheezing.     Comments: Good air exch Chest:     Chest wall: No tenderness.  Abdominal:     General: Bowel sounds are normal. There  is no distension or abdominal bruit.     Palpations: Abdomen is soft. There is no mass.     Tenderness: There is no abdominal tenderness.     Hernia: No hernia is present.  Genitourinary:    Comments: Breast exam: No mass, nodules, thickening, tenderness, bulging, retraction, inflamation, nipple discharge or skin changes noted.  No axillary or clavicular LA.     Musculoskeletal:        General: No tenderness. Normal range of motion.     Cervical back: Normal range of motion and neck supple. No rigidity. No muscular tenderness.     Right lower leg: No edema.     Left lower leg: No edema.     Comments: No kyphosis   Some tenderness L greater trochanter  Lymphadenopathy:     Cervical:  No cervical adenopathy.  Skin:    General: Skin is warm and dry.     Coloration: Skin is not pale.     Findings: No erythema or rash.     Comments: Solar lentigines diffusely   Neurological:     Mental Status: She is alert. Mental status is at baseline.     Cranial Nerves: No cranial nerve deficit.     Motor: No abnormal muscle tone.     Coordination: Coordination normal.     Gait: Gait normal.     Deep Tendon Reflexes: Reflexes are normal and symmetric. Reflexes normal.  Psychiatric:        Mood and Affect: Mood normal.        Cognition and Memory: Cognition and memory normal.          Assessment & Plan:   Problem List Items Addressed This Visit       Cardiovascular and Mediastinum   Essential hypertension    bp in fair control at this time  BP Readings from Last 1 Encounters:  04/13/21 122/80  No changes needed Most recent labs reviewed  Disc lifstyle change with low sodium diet and exercise  Plan to continue losartan 25 mg daily  Labs ordered      Relevant Orders   CBC with Differential/Platelet   Comprehensive metabolic panel   Lipid panel   TSH     Endocrine   DM (diabetes mellitus), type 2 ([place])    Overdue for endocrinology visit (since her husband died)  Glucose has been up with stress  Has appt on Tuesday and will get a1c there       Hyperlipidemia associated with type 2 diabetes mellitus ([place])    Disc goals for lipids and reasons to control them Rev last labs with pt Rev low sat fat diet in detail  Plan to continue atorvastatin 40 mg daily  Labs ordered      Relevant Orders   Lipid panel     Other   Encounter for Medicare annual wellness exam - Primary    Reviewed health habits including diet and exercise and skin cancer prevention Reviewed appropriate screening tests for age  Also reviewed health mt list, fam hx and immunization status , as well as social and family history   See HPI Labs ordered  Pt plans to schedule eye exam with Dr  Champ Mungo to consider shingrix if her ins covers it  utd colonoscopy  Mammogram ordered-pt will schedule  dexa utd/normal BMD No falls or fractures Advance directive is up to date  No cognitive concerns  PHQ score of 0 (discussed grief after loss of husband) No help  needed for ADLs  Excellent functionality        Screening mammogram, encounter for    Mammogram ordered at breast center Nl breast exam today  Pt will schedule it      Relevant Orders   MM 3D SCREEN BREAST BILATERAL

## 2021-04-13 NOTE — Assessment & Plan Note (Signed)
Overdue for endocrinology visit (since her husband died)  Glucose has been up with stress  Has appt on Tuesday and will get a1c there

## 2021-04-13 NOTE — Assessment & Plan Note (Signed)
bp in fair control at this time  BP Readings from Last 1 Encounters:  04/13/21 122/80   No changes needed Most recent labs reviewed  Disc lifstyle change with low sodium diet and exercise  Plan to continue losartan 25 mg daily  Labs ordered

## 2021-04-13 NOTE — Assessment & Plan Note (Signed)
Mammogram ordered at breast center Nl breast exam today  Pt will schedule it

## 2021-04-14 ENCOUNTER — Other Ambulatory Visit: Payer: Self-pay | Admitting: Family Medicine

## 2021-04-19 DIAGNOSIS — I10 Essential (primary) hypertension: Secondary | ICD-10-CM | POA: Diagnosis not present

## 2021-04-19 DIAGNOSIS — E1165 Type 2 diabetes mellitus with hyperglycemia: Secondary | ICD-10-CM | POA: Diagnosis not present

## 2021-04-19 DIAGNOSIS — E7800 Pure hypercholesterolemia, unspecified: Secondary | ICD-10-CM | POA: Diagnosis not present

## 2021-05-09 ENCOUNTER — Ambulatory Visit
Admission: RE | Admit: 2021-05-09 | Discharge: 2021-05-09 | Disposition: A | Discharge status: Home / Self Care | Payer: Medicare Other | Source: Ambulatory Visit | Attending: Family Medicine | Admitting: Family Medicine

## 2021-05-09 DIAGNOSIS — Z1231 Encounter for screening mammogram for malignant neoplasm of breast: Secondary | ICD-10-CM

## 2021-06-09 ENCOUNTER — Other Ambulatory Visit: Payer: Self-pay | Admitting: Family Medicine

## 2021-06-10 ENCOUNTER — Other Ambulatory Visit: Payer: Self-pay | Admitting: Family Medicine

## 2021-07-12 ENCOUNTER — Other Ambulatory Visit: Payer: Self-pay | Admitting: Family Medicine

## 2021-09-20 DIAGNOSIS — H40003 Preglaucoma, unspecified, bilateral: Secondary | ICD-10-CM | POA: Diagnosis not present

## 2021-09-20 DIAGNOSIS — H2513 Age-related nuclear cataract, bilateral: Secondary | ICD-10-CM | POA: Diagnosis not present

## 2021-09-20 DIAGNOSIS — H524 Presbyopia: Secondary | ICD-10-CM | POA: Diagnosis not present

## 2021-09-20 DIAGNOSIS — H5213 Myopia, bilateral: Secondary | ICD-10-CM | POA: Diagnosis not present

## 2021-09-20 DIAGNOSIS — H40013 Open angle with borderline findings, low risk, bilateral: Secondary | ICD-10-CM | POA: Diagnosis not present

## 2021-09-20 DIAGNOSIS — H52221 Regular astigmatism, right eye: Secondary | ICD-10-CM | POA: Diagnosis not present

## 2021-10-17 DIAGNOSIS — E1165 Type 2 diabetes mellitus with hyperglycemia: Secondary | ICD-10-CM | POA: Diagnosis not present

## 2021-10-17 DIAGNOSIS — I10 Essential (primary) hypertension: Secondary | ICD-10-CM | POA: Diagnosis not present

## 2021-10-17 DIAGNOSIS — E7800 Pure hypercholesterolemia, unspecified: Secondary | ICD-10-CM | POA: Diagnosis not present

## 2021-12-21 DIAGNOSIS — H524 Presbyopia: Secondary | ICD-10-CM | POA: Diagnosis not present

## 2021-12-21 DIAGNOSIS — H5213 Myopia, bilateral: Secondary | ICD-10-CM | POA: Diagnosis not present

## 2021-12-21 DIAGNOSIS — H52221 Regular astigmatism, right eye: Secondary | ICD-10-CM | POA: Diagnosis not present

## 2021-12-21 DIAGNOSIS — H40013 Open angle with borderline findings, low risk, bilateral: Secondary | ICD-10-CM | POA: Diagnosis not present

## 2022-01-10 DIAGNOSIS — Z23 Encounter for immunization: Secondary | ICD-10-CM | POA: Diagnosis not present

## 2022-01-17 DIAGNOSIS — Z23 Encounter for immunization: Secondary | ICD-10-CM | POA: Diagnosis not present

## 2022-01-31 ENCOUNTER — Encounter (HOSPITAL_COMMUNITY): Payer: Self-pay

## 2022-01-31 ENCOUNTER — Ambulatory Visit (HOSPITAL_COMMUNITY)
Admission: RE | Admit: 2022-01-31 | Discharge: 2022-01-31 | Disposition: A | Discharge status: Home / Self Care | Payer: Medicare Other | Source: Ambulatory Visit | Attending: Family Medicine | Admitting: Family Medicine

## 2022-01-31 ENCOUNTER — Telehealth: Payer: Self-pay

## 2022-01-31 VITALS — BP 166/76 | HR 100 | Temp 98.2°F | Resp 16

## 2022-01-31 DIAGNOSIS — R21 Rash and other nonspecific skin eruption: Secondary | ICD-10-CM | POA: Diagnosis not present

## 2022-01-31 MED ORDER — PREDNISONE 10 MG (21) PO TBPK: ORAL_TABLET | Freq: Every day | ORAL | 0 refills | Status: DC

## 2022-01-31 MED ORDER — FLUOCINONIDE 0.05 % EX CREA: 1.0000 | TOPICAL_CREAM | Freq: Two times a day (BID) | CUTANEOUS | 0 refills | Status: DC

## 2022-01-31 NOTE — Telephone Encounter (Signed)
Aware, will watch for correspondence  

## 2022-01-31 NOTE — Telephone Encounter (Signed)
Pt has rash that started on lt side of chest 01/29/22.since yesterday rash is spreading toward neck; no blisters seen. Area is itchy. No available appts at [facility] or [facility] office near pt; I scheduled pt [facility] 01/31/22 at 2:30 with UC & ED precautions and pt voiced understanding. Sending note to Dr Glori Bickers.

## 2022-01-31 NOTE — ED Provider Notes (Signed)
[place]   [id number] 01/31/22 Arrival Time: 5397  ASSESSMENT & PLAN:  1. Rash and nonspecific skin eruption    Will treat as contact dermatitis. Prefers to try topical first; will start prednisone if not improving.  Meds ordered this encounter  Medications   fluocinonide cream (LIDEX) 0.05 %    Sig: Apply 1 Application topically 2 (two) times daily.    Dispense:  60 g    Refill:  0   predniSONE (STERAPRED UNI-PAK 21 TAB) 10 MG (21) TBPK tablet    Sig: Take by mouth daily. Take as directed.    Dispense:  21 tablet    Refill:  0   No signs of bacterial skin infection.  Will follow up with PCP or here if worsening or failing to improve as anticipated. Reviewed expectations re: course of current medical issues. Questions answered. Outlined signs and symptoms indicating need for more acute intervention. Patient verbalized understanding. After Visit Summary given.   SUBJECTIVE:  Donna Murphy is a 76 y.o. female who presents with a skin complaint. "Rash" on LEFT upper chest and BILATERAL upper arms. Few days. Unknown trigger. Triamcinolone cream without much relief. Benadryl with some relief. Afebrile. No pain. H/O similar approx 9-10 y ago.   OBJECTIVE: Vitals:   01/31/22 1443  BP: (!) 166/76  Pulse: 100  Resp: 16  Temp: 98.2 F (36.8 C)  TempSrc: Oral  SpO2: 96%    General appearance: alert; no distress HEENT: [place]; AT Neck: supple with FROM Extremities: no edema; moves all extremities normally Skin: warm and dry; smooth, slightly elevated and erythematous plaques of variable size over her left upper chest and less so on bilateral upper arms; no ulcerations or vesicles Psychological: alert and cooperative; normal mood and affect  Allergies  Allergen Reactions   Ciprofloxacin     Arm numbness   Sulfonamide Derivatives     Hives, rash    Past Medical History:  Diagnosis Date   Allergic rhinitis    Allergy    DMII (diabetes mellitus, type 2)  ([facility])    Fibroids    uterine   GERD (gastroesophageal reflux disease)    HLD (hyperlipidemia)    HTN (hypertension)    Hx of colonic polyps 03/01/2016   Irritable bowel syndrome    Kidney stone    passed stone-no surgery required   Skin cancer of lip    basal cell   Varices of other sites    Social History   Socioeconomic History   Marital status: Widowed    Spouse name: Not on file   Number of children: Not on file   Years of education: Not on file   Highest education level: Not on file  Occupational History   Not on file  Tobacco Use   Smoking status: Never   Smokeless tobacco: Never  Substance and Sexual Activity   Alcohol use: Yes    Alcohol/week: 7.0 - 10.0 standard drinks of alcohol    Types: 7 - 10 Glasses of wine per week    Comment: occ   Drug use: No   Sexual activity: Yes    Birth control/protection: Post-menopausal  Other Topics Concern   Not on file  Social History Narrative   Walks for exercise; does yardwork too         Social Determinants of Health   Financial Resource Strain: Not on file  Food Insecurity: Not on file  Transportation Needs: Not on file  Physical Activity: Not on  file  Stress: Not on file  Social Connections: Not on file  Intimate Partner Violence: Not on file   Family History  Problem Relation Age of Onset   Coronary artery disease Mother    Hyperlipidemia Mother    Hypertension Mother    Osteoporosis Mother    Heart attack Mother        CABG in her 50's   Diabetes Father    Heart failure Father    Hypertension Father    Transient ischemic attack Father    Diabetes Unknown        GF   Hypertension Brother    Heart murmur Son    Brain cancer Sister    Colon cancer Neg Hx    Esophageal cancer Neg Hx    Stomach cancer Neg Hx    Rectal cancer Neg Hx    Past Surgical History:  Procedure Laterality Date   COLONOSCOPY  9/07   negative - gessner   ENDOMETRIAL BIOPSY  10/01   negative   TONSILLECTOMY     TOOTH  EXTRACTION        Vanessa Kick, MD 01/31/22 1819

## 2022-01-31 NOTE — ED Triage Notes (Signed)
Pt reports a rash on chest and a few spots on both arms. States the rash is spreading and is itchy. Denies any pain. Reports having the same rash in August and tried dermasil and Triamcinolone creme.  States she recently has started taking Vitamin E and Hair skin and nail supplements , had covid, flu and RSV shot recently as well.   Took a benadryl last night and again this morning.

## 2022-01-31 NOTE — Discharge Instructions (Signed)
Be aware, your blood sugars will be elevated while taking oral prednisone.

## 2022-03-07 ENCOUNTER — Other Ambulatory Visit: Payer: Self-pay | Admitting: Family Medicine

## 2022-03-15 DIAGNOSIS — H52221 Regular astigmatism, right eye: Secondary | ICD-10-CM | POA: Diagnosis not present

## 2022-03-15 DIAGNOSIS — H40043 Steroid responder, bilateral: Secondary | ICD-10-CM | POA: Diagnosis not present

## 2022-03-15 DIAGNOSIS — H524 Presbyopia: Secondary | ICD-10-CM | POA: Diagnosis not present

## 2022-03-15 DIAGNOSIS — H40013 Open angle with borderline findings, low risk, bilateral: Secondary | ICD-10-CM | POA: Diagnosis not present

## 2022-03-15 DIAGNOSIS — H40053 Ocular hypertension, bilateral: Secondary | ICD-10-CM | POA: Diagnosis not present

## 2022-03-15 DIAGNOSIS — H5213 Myopia, bilateral: Secondary | ICD-10-CM | POA: Diagnosis not present

## 2022-04-13 ENCOUNTER — Other Ambulatory Visit: Payer: Self-pay | Admitting: Family Medicine

## 2022-04-14 ENCOUNTER — Ambulatory Visit (INDEPENDENT_AMBULATORY_CARE_PROVIDER_SITE_OTHER): Payer: Medicare Other | Admitting: Family Medicine

## 2022-04-14 ENCOUNTER — Encounter: Payer: Self-pay | Admitting: Family Medicine

## 2022-04-14 ENCOUNTER — Other Ambulatory Visit: Payer: Self-pay | Admitting: Family Medicine

## 2022-04-14 VITALS — BP 141/62 | HR 63 | Temp 98.0°F | Ht 63.25 in | Wt 128.1 lb

## 2022-04-14 DIAGNOSIS — I10 Essential (primary) hypertension: Secondary | ICD-10-CM | POA: Diagnosis not present

## 2022-04-14 DIAGNOSIS — H579 Unspecified disorder of eye and adnexa: Secondary | ICD-10-CM

## 2022-04-14 DIAGNOSIS — K219 Gastro-esophageal reflux disease without esophagitis: Secondary | ICD-10-CM | POA: Diagnosis not present

## 2022-04-14 DIAGNOSIS — R0602 Shortness of breath: Secondary | ICD-10-CM | POA: Diagnosis not present

## 2022-04-14 DIAGNOSIS — R21 Rash and other nonspecific skin eruption: Secondary | ICD-10-CM

## 2022-04-14 MED ORDER — AMLODIPINE BESYLATE 5 MG PO TABS: 5.0000 mg | ORAL_TABLET | Freq: Every day | ORAL | 0 refills | Status: DC

## 2022-04-14 MED ORDER — OMEPRAZOLE 20 MG PO CPDR: 20.0000 mg | DELAYED_RELEASE_CAPSULE | Freq: Two times a day (BID) | ORAL | 1 refills | Status: DC

## 2022-04-14 MED ORDER — OMEPRAZOLE 40 MG PO CPDR: 40.0000 mg | DELAYED_RELEASE_CAPSULE | Freq: Every day | ORAL | 1 refills | Status: DC

## 2022-04-14 NOTE — Progress Notes (Unsigned)
   Subjective:    Patient ID: Donna Murphy, female    DOB: Apr 14, 1945, 77 y.o.   MRN: [id number]  HPI Pt presents for issues with acid reflux Rash  Positional dizziness Exertional sob since she had uri  Blood pressure- ? Up     Wt Readings from Last 3 Encounters:  04/14/22 128 lb 2 oz (58.1 kg)  04/13/21 131 lb (59.4 kg)  03/21/21 135 lb (61.2 kg)   22.52 kg/m  Vitals:   04/14/22 1041  BP: (!) 144/68  Pulse: 63  Temp: 98 F (36.7 C)  TempSrc: Temporal  SpO2: 100%  Weight: 128 lb 2 oz (58.1 kg)  Height: 5' 3.25" (1.607 m)   Seen in UC for rash in October  Dx with dermatitis  Px lidex 0.05% cream and prednisone pack Took benadryl also  Comes and goes  Is itchy  Arms  Occ on chest  Of note she had insect bites this summer     (? Spider) Has several hypopigmented areas (will get red at times as well(     HTN bp is stable today  No cp or palpitations or headaches or edema  No side effects to medicines  BP Readings from Last 3 Encounters:  04/14/22 (!) 144/68  01/31/22 (!) 166/76  04/13/21 122/80    Losartan 25 mg daily   Bp was normal at home yesterday    When she bends over and stands up she gets dizzy  Ever since stopping flonase    GERD- still causing a lot of problems  Wakes her up in the middle of the night  Burping helps  Omeprazole 20 mg daily   Pepcid 20 mg qhs -was added and did not help  No problems swallowing      Getting ready for cataract surgery  Also procedure for glaucoma  Has appt with eye surgeon next month  They inst her to stop flonase due to eye pressure    She was exp to daughter who was sick Then got some nasal congestion (no cough) wth some pnd  Felt terrible for 1-2 weeks  Since then she gets out of breath when she exerts herself    Skin /nail dryness  Biotin   Review of Systems     Objective:   Physical Exam        Assessment & Plan:

## 2022-04-14 NOTE — Telephone Encounter (Signed)
See note from pharmacy, it says insurance only pays for 1 caps daily (will pay for 40 mg Rx once daily), but not BID dosing, they asked to send in a 1 tab daily Rx or try and do a PA for BID dosing, please advise

## 2022-04-14 NOTE — Telephone Encounter (Signed)
I sent in the 40 instead Please let her know  Thanks

## 2022-04-14 NOTE — Patient Instructions (Addendum)
Stop the losartan  Start amlodipine 5 mg daily  If any intolerable side effects (or if bp is under 90/50) hold it and let me know   You can monitor it at home  Stop the steroid cream for now  Use moisturizers instead  Avoid hot water and harsh detergents   Increase the omeprazole to 20 mg twice daily   Follow up in approx 2 weeks   Take care of yourself

## 2022-04-16 NOTE — Assessment & Plan Note (Addendum)
Nl exam No chest pain  May be post viral from uri  ? If possible allergy to arb Will hold this and f/u  Consider cxr if not improving   ER precautions noted

## 2022-04-16 NOTE — Assessment & Plan Note (Signed)
Pt is experiencing itchy rash/ some exertional sob and postural dizziness along with labilt bp at home  ? If possible rxn to arb / losartan 25 mg   BP: (!) 141/62   Will hold this Try amlodiping 5 mg daily and plan f/u  She will monitor at home as well Overall good lifestyle habits

## 2022-04-16 NOTE — Assessment & Plan Note (Signed)
Not adequately controlled with omep 20 and pepcid 20 bid   Will inc omep to 40 mg (ins will not pay for bid) Disc diet  If not improved consider GI referral

## 2022-04-16 NOTE — Assessment & Plan Note (Signed)
Primarily on arms Dryness Some areas of hypopigmentation may be from rash or from the topical steroid use   ? If possible allergy to her arb Will change this med  Enc use of moisturizers Avoid fragrances/hot water and harsh detergents F/u planned

## 2022-04-16 NOTE — Assessment & Plan Note (Signed)
Noted poss early glaucoma at eye exam  This is being watched  Also planning cataract surgery

## 2022-04-19 DIAGNOSIS — I10 Essential (primary) hypertension: Secondary | ICD-10-CM | POA: Diagnosis not present

## 2022-04-19 DIAGNOSIS — E1165 Type 2 diabetes mellitus with hyperglycemia: Secondary | ICD-10-CM | POA: Diagnosis not present

## 2022-04-19 DIAGNOSIS — K219 Gastro-esophageal reflux disease without esophagitis: Secondary | ICD-10-CM | POA: Diagnosis not present

## 2022-04-19 DIAGNOSIS — E7800 Pure hypercholesterolemia, unspecified: Secondary | ICD-10-CM | POA: Diagnosis not present

## 2022-04-19 DIAGNOSIS — R21 Rash and other nonspecific skin eruption: Secondary | ICD-10-CM | POA: Diagnosis not present

## 2022-04-20 LAB — CBC AND DIFFERENTIAL
HCT: 28 — AB (ref 36–46)
Hemoglobin: 8 — AB (ref 12.0–16.0)
Platelets: 540 10*3/uL — AB (ref 150–400)

## 2022-04-27 ENCOUNTER — Telehealth: Payer: Self-pay | Admitting: *Deleted

## 2022-04-27 NOTE — Telephone Encounter (Signed)
Aware thanks for that update and I will see her then

## 2022-04-27 NOTE — Telephone Encounter (Signed)
Received labs from Dr. Chalmers Cater and pt's hemoglobin was 8.0,  Dr. Glori Bickers placed a note asking about this low level.  Called pt and she said Dr. Chalmers Cater did start her on iron tabs. She has been taking them and she told her to f/u with [place] because pt may need a colonoscopy or referral to hematology. Pt has f/u scheduled 05/02/22.  Pt said she was also advised that since she is allergic to sulfa to stop the Amaryl and use sulfa free shampoo. Pt also was advised to stop the collagen OTC tabs because nails splitting may be from anemia not low collagen.  FYI to PCP. Labs placed back in your inbox to review with pt at f/u on 05/02/22

## 2022-05-02 ENCOUNTER — Encounter: Payer: Self-pay | Admitting: Internal Medicine

## 2022-05-02 ENCOUNTER — Encounter: Payer: Self-pay | Admitting: Family Medicine

## 2022-05-02 ENCOUNTER — Ambulatory Visit (INDEPENDENT_AMBULATORY_CARE_PROVIDER_SITE_OTHER): Payer: Medicare Other | Admitting: Family Medicine

## 2022-05-02 ENCOUNTER — Telehealth: Payer: Self-pay

## 2022-05-02 VITALS — BP 132/70 | HR 85 | Temp 97.9°F | Ht 63.25 in | Wt 125.2 lb

## 2022-05-02 DIAGNOSIS — D509 Iron deficiency anemia, unspecified: Secondary | ICD-10-CM

## 2022-05-02 DIAGNOSIS — Z86010 Personal history of colonic polyps: Secondary | ICD-10-CM

## 2022-05-02 DIAGNOSIS — K219 Gastro-esophageal reflux disease without esophagitis: Secondary | ICD-10-CM

## 2022-05-02 DIAGNOSIS — I10 Essential (primary) hypertension: Secondary | ICD-10-CM

## 2022-05-02 LAB — CBC WITH DIFFERENTIAL/PLATELET
Basophils Absolute: 0.1 10*3/uL (ref 0.0–0.1)
Basophils Relative: 1 % (ref 0.0–3.0)
Eosinophils Absolute: 0.1 10*3/uL (ref 0.0–0.7)
Eosinophils Relative: 1.2 % (ref 0.0–5.0)
HCT: 24 % — ABNORMAL LOW (ref 36.0–46.0)
Hemoglobin: 7.5 g/dL — CL (ref 12.0–15.0)
Lymphocytes Relative: 21.9 % (ref 12.0–46.0)
Lymphs Abs: 1.4 10*3/uL (ref 0.7–4.0)
MCHC: 31.2 g/dL (ref 30.0–36.0)
MCV: 66.7 fl — ABNORMAL LOW (ref 78.0–100.0)
Monocytes Absolute: 0.4 10*3/uL (ref 0.1–1.0)
Monocytes Relative: 6.1 % (ref 3.0–12.0)
Neutro Abs: 4.6 10*3/uL (ref 1.4–7.7)
Neutrophils Relative %: 69.8 % (ref 43.0–77.0)
Platelets: 465 10*3/uL — ABNORMAL HIGH (ref 150.0–400.0)
RBC: 3.59 Mil/uL — ABNORMAL LOW (ref 3.87–5.11)
RDW: 18.2 % — ABNORMAL HIGH (ref 11.5–15.5)
WBC: 6.6 10*3/uL (ref 4.0–10.5)

## 2022-05-02 LAB — IRON: Iron: 135 ug/dL (ref 42–145)

## 2022-05-02 LAB — FERRITIN: Ferritin: 2 ng/mL — ABNORMAL LOW (ref 10.0–291.0)

## 2022-05-02 NOTE — Assessment & Plan Note (Signed)
Last colonscopy 02/2018 with opt to do 5 y recall Dysplasia in the more distant past   Now newly anemic  Sent urgent ref to GI/ Dr Carlean Purl

## 2022-05-02 NOTE — Telephone Encounter (Signed)
Pt notified of lab results and Dr. Marliss Coots pt does agree with hematology referral also she would like to see someone in [place] if possible. ER precautions given and she will keep appt with GI also

## 2022-05-02 NOTE — Assessment & Plan Note (Addendum)
New Mildly symptomatic- tired/exercise intol Hb of 8.0 (reviewed labs and note from endocrinology Dr Chalmers Cater)  Some fatigue and exercise intolerance  Vitals are stable/not hypotensive On iron now since mid month  Lab today for cbc and iron levels  (if not imp with oral may need to consider infusions)  Urgent ref made to GI for eval/tx (will likely need endoscopy)  Addendum: Lab today noted Hb 7.5, nl iron and very low ferritin Referral done to hematology- may need IV iron or transfusion and further eval  Pt voiced understanding Er precautions noted She has Gi appt on thursday

## 2022-05-02 NOTE — Telephone Encounter (Signed)
Referral done and marked urgent Let us know if she does not hear tomorrow

## 2022-05-02 NOTE — Addendum Note (Signed)
Addended by: Loura Pardon A on: 05/02/2022 05:31 PM   Modules accepted: Orders

## 2022-05-02 NOTE — Patient Instructions (Signed)
Labs today   Continue your iron   If you get more dizzy let us know   Call the GI office to get your appointment    [facility]  [phone]

## 2022-05-02 NOTE — Progress Notes (Signed)
Subjective:    Patient ID: Donna Murphy, female    DOB: Oct 02, 1945, 77 y.o.   MRN: [id number]  HPI Pt presents for f/u of HTN , labs and chronic medical problems   Wt Readings from Last 3 Encounters:  05/02/22 125 lb 4 oz (56.8 kg)  04/14/22 128 lb 2 oz (58.1 kg)  04/13/21 131 lb (59.4 kg)   22.01 kg/m  Vitals:   05/02/22 0915 05/02/22 0950  BP: (!) 150/74 132/70  Pulse: 85   Temp: 97.9 F (36.6 C)   SpO2: 99%      Pt had labs from endocrinology  Hb was 8.0  She was started on iron tabs with Dr Chalmers Cater   Wbc 7.3 Hb 8.0 Hct 28.4 MCV 74 low  Platelets 540 high   Had blood in stool one time in August  Within stool  None in water Dark   Abdominal pain  Indigestion and more gas - all her life  Iron makes it worse  On omeprazole 40 mg daily   (20 mg twice daily prefers)  Pepcid is 20 mg at bedtime   Some dizziness and sob off and on    Sugar free matamucil   Last here Lab Results  Component Value Date   WBC 5.9 04/13/2021   HGB 13.8 04/13/2021   HCT 42.9 04/13/2021   MCV 88.9 04/13/2021   PLT 362.0 04/13/2021   No results found for: "IRON", "TIBC", "FERRITIN"  Lab Results  Component Value Date   CREATININE 0.76 04/13/2021   BUN 22 04/13/2021   NA 139 04/13/2021   K 4.2 04/13/2021   CL 103 04/13/2021   CO2 28 04/13/2021     Nails are splitting   Personal h/o colon cancer  Colonoscopy 11/19    HTN bp is stable today  No cp or palpitations or headaches or edema  No side effects to medicines  BP Readings from Last 3 Encounters:  05/02/22 132/70  04/14/22 (!) 141/62  01/31/22 (!) 166/76    Amlodipine 5 mg daily   Had rxn to losartan   Patient Active Problem List   Diagnosis Date Noted   Microcytic anemia 05/02/2022   Eye pressure 04/14/2022   SOB (shortness of breath) on exertion 04/14/2022   Hyperlipidemia associated with type 2 diabetes mellitus ([place]) 02/18/2019   Screening mammogram, encounter for 02/18/2019   Malignant  neoplasm of ascending colon ([place]) 06/03/2018   History of colon polyps 05/24/2017   Hx of colonic polyps 03/01/2016   Encounter for Medicare annual wellness exam 11/12/2013   Rash and nonspecific skin eruption 06/03/2012   Other screening mammogram 05/24/2011   Gynecological examination 05/24/2011   DM (diabetes mellitus), type 2 ([place]) 12/04/2006   Essential hypertension 07/12/2006   VARICOSE VEIN 07/12/2006   ALLERGIC RHINITIS 07/12/2006   GERD 07/12/2006   IRRITABLE BOWEL SYNDROME 07/12/2006   Past Medical History:  Diagnosis Date   Allergic rhinitis    Allergy    DMII (diabetes mellitus, type 2) ([place])    Fibroids    uterine   GERD (gastroesophageal reflux disease)    HLD (hyperlipidemia)    HTN (hypertension)    Hx of colonic polyps 03/01/2016   Irritable bowel syndrome    Kidney stone    passed stone-no surgery required   Skin cancer of lip    basal cell   Varices of other sites    Past Surgical History:  Procedure Laterality Date   COLONOSCOPY  9/07  negative - gessner   ENDOMETRIAL BIOPSY  10/01   negative   TONSILLECTOMY     TOOTH EXTRACTION     Social History   Tobacco Use   Smoking status: Never   Smokeless tobacco: Never  Substance Use Topics   Alcohol use: Yes    Alcohol/week: 7.0 - 10.0 standard drinks of alcohol    Types: 7 - 10 Glasses of wine per week    Comment: occ   Drug use: No   Family History  Problem Relation Age of Onset   Coronary artery disease Mother    Hyperlipidemia Mother    Hypertension Mother    Osteoporosis Mother    Heart attack Mother        CABG in her 65's   Diabetes Father    Heart failure Father    Hypertension Father    Transient ischemic attack Father    Diabetes Unknown        GF   Hypertension Brother    Heart murmur Son    Brain cancer Sister    Colon cancer Neg Hx    Esophageal cancer Neg Hx    Stomach cancer Neg Hx    Rectal cancer Neg Hx    Allergies  Allergen Reactions   Ciprofloxacin     Arm  numbness   Sulfonamide Derivatives     Hives, rash   Current Outpatient Medications on File Prior to Visit  Medication Sig Dispense Refill   amLODipine (NORVASC) 5 MG tablet Take 1 tablet (5 mg total) by mouth daily. 90 tablet 0   aspirin 81 MG tablet Take 81 mg by mouth daily.     atorvastatin (LIPITOR) 40 MG tablet TAKE 1 TABLET BY MOUTH EVERY DAY 90 tablet 0   cetirizine (ZYRTEC) 10 MG tablet Take 10 mg by mouth daily.     Cholecalciferol (VITAMIN D) 2000 UNITS tablet Take 2,000 Units by mouth daily.     famotidine (PEPCID) 20 MG tablet TAKE 1 TABLET BY MOUTH EVERYDAY AT BEDTIME 90 tablet 0   FARXIGA 10 MG TABS tablet Take 10 mg by mouth daily.  6   ferrous gluconate (FERGON) 324 MG tablet Take 648 mg by mouth daily with breakfast.     glucose blood (ONETOUCH VERIO) test strip Ck blood sugar once daily and as directed. Dx E11.9 100 each 1   JANUVIA 100 MG tablet TAKE 1 TABLET BY MOUTH ONCE DAILY FOR 30 DAYS  5   Krill Oil 300 MG CAPS Take 1 capsule by mouth daily.     metFORMIN (GLUCOPHAGE) 500 MG tablet Take 1,000 mg by mouth 2 (two) times daily with a meal.     Multiple Vitamin (MULTIVITAMIN) tablet Take 1 tablet by mouth daily.     omeprazole (PRILOSEC) 40 MG capsule Take 1 capsule (40 mg total) by mouth daily. 90 capsule 1   psyllium (HYDROCIL/METAMUCIL) 95 % PACK Take 1 packet by mouth daily.     triamcinolone cream (KENALOG) 0.1 % Apply 1 Application topically daily as needed.     No current facility-administered medications on file prior to visit.    Review of Systems  Constitutional:  Positive for fatigue. Negative for activity change, appetite change, fever and unexpected weight change.  HENT:  Negative for congestion, ear pain, rhinorrhea, sinus pressure and sore throat.   Eyes:  Negative for pain, redness and visual disturbance.  Respiratory:  Negative for cough, shortness of breath and wheezing.  Occ sob on exertion   Cardiovascular:  Negative for chest pain and  palpitations.  Gastrointestinal:  Positive for blood in stool. Negative for abdominal distention, abdominal pain, constipation, diarrhea, nausea and vomiting.       Dyspepsia  Some GERD symptoms   Had blood in stool once   Endocrine: Negative for polydipsia and polyuria.  Genitourinary:  Negative for dysuria, frequency and urgency.  Musculoskeletal:  Negative for arthralgias, back pain and myalgias.  Skin:  Negative for pallor and rash.  Allergic/Immunologic: Negative for environmental allergies.  Neurological:  Negative for dizziness, syncope and headaches.  Hematological:  Negative for adenopathy. Does not bruise/bleed easily.  Psychiatric/Behavioral:  Negative for decreased concentration and dysphoric mood. The patient is not nervous/anxious.        Objective:   Physical Exam Constitutional:      General: She is not in acute distress.    Appearance: Normal appearance. She is well-developed and normal weight. She is not ill-appearing or diaphoretic.  HENT:     Head: Normocephalic and atraumatic.  Eyes:     General: No scleral icterus.       Right eye: No discharge.        Left eye: No discharge.     Conjunctiva/sclera: Conjunctivae normal.     Pupils: Pupils are equal, round, and reactive to light.  Neck:     Thyroid: No thyromegaly.     Vascular: No carotid bruit or JVD.  Cardiovascular:     Rate and Rhythm: Normal rate and regular rhythm.     Heart sounds: Normal heart sounds.     No gallop.  Pulmonary:     Effort: Pulmonary effort is normal. No respiratory distress.     Breath sounds: Normal breath sounds. No wheezing or rales.  Abdominal:     General: There is no distension or abdominal bruit.     Palpations: Abdomen is soft.  Musculoskeletal:     Cervical back: Normal range of motion and neck supple. No tenderness.     Right lower leg: No edema.     Left lower leg: No edema.  Lymphadenopathy:     Cervical: No cervical adenopathy.  Skin:    General: Skin is  warm and dry.     Coloration: Skin is pale.     Findings: No rash.  Neurological:     Mental Status: She is alert.     Cranial Nerves: No cranial nerve deficit.     Coordination: Coordination normal.     Deep Tendon Reflexes: Reflexes are normal and symmetric. Reflexes normal.  Psychiatric:        Mood and Affect: Mood normal.           Assessment & Plan:   Problem List Items Addressed This Visit       Cardiovascular and Mediastinum   Essential hypertension    bp in fair control at this time  BP Readings from Last 1 Encounters:  05/02/22 132/70  No changes needed Most recent labs reviewed  Disc lifstyle change with low sodium diet and exercise  Plan to continue amlodipine 5 mg         Digestive   GERD    Still struggling with this and dyspepsia  Also new anemia  Urgent ref to GI today  Reassuring exam      Relevant Orders   Ambulatory referral to Gastroenterology     Other   Hx of colonic polyps    Last colonscopy 02/2018 with  opt to do 5 y recall Dysplasia in the more distant past   Now newly anemic  Sent urgent ref to GI/ Dr Carlean Purl        Relevant Orders   Ambulatory referral to Gastroenterology   Microcytic anemia - Primary    New Mildly symptomatic- tired/exercise intol Hb of 8.0 (reviewed labs and note from endocrinology Dr Chalmers Cater)  Some fatigue and exercise intolerance  Vitals are stable/not hypotensive On iron now since mid month  Lab today for cbc and iron levels  (if not imp with oral may need to consider infusions)  Urgent ref made to GI for eval/tx (will likely need endoscopy)       Relevant Medications   ferrous gluconate (FERGON) 324 MG tablet   Other Relevant Orders   Iron   Ferritin   CBC with Differential/Platelet   Ambulatory referral to Gastroenterology

## 2022-05-02 NOTE — Assessment & Plan Note (Signed)
Still struggling with this and dyspepsia  Also new anemia  Urgent ref to GI today  Reassuring exam

## 2022-05-02 NOTE — Telephone Encounter (Signed)
Santiago Glad at [facility] lab called report for critical hgb result 7.5;sending note to Dr Glori Bickers, [employer] pool and will speak with Sh[place]ale CMA. Results noted in lab book also.

## 2022-05-02 NOTE — Assessment & Plan Note (Signed)
bp in fair control at this time  BP Readings from Last 1 Encounters:  05/02/22 132/70   No changes needed Most recent labs reviewed  Disc lifstyle change with low sodium diet and exercise  Plan to continue amlodipine 5 mg

## 2022-05-02 NOTE — Telephone Encounter (Signed)
Please let pt know hb is a bit lower (iron level is in the normal range-possibly because she is on it) but iron stores are low  Keep GI appt as planned   I want to add a hematology appt as soon as we can (may end up needing IV treatment or transfusion)  We discussed this poss at her visit  Ina to refer?   Please let me know   In the meantime if she feels worse then go to ER (as we discussed)

## 2022-05-03 ENCOUNTER — Telehealth: Payer: Self-pay | Admitting: Hematology and Oncology

## 2022-05-03 NOTE — Telephone Encounter (Signed)
Scheduled appt per 1/31 referral. Pt is aware of appt date and time. Pt is aware to arrive 15 mins prior to appt time and to bring and updated insurance card. Pt is aware of appt location.

## 2022-05-03 NOTE — Telephone Encounter (Signed)
Urgent referral teams notified

## 2022-05-04 ENCOUNTER — Encounter: Payer: Self-pay | Admitting: Internal Medicine

## 2022-05-04 ENCOUNTER — Ambulatory Visit (INDEPENDENT_AMBULATORY_CARE_PROVIDER_SITE_OTHER): Payer: Medicare Other | Admitting: Internal Medicine

## 2022-05-04 VITALS — BP 136/74 | HR 101 | Ht 63.0 in | Wt 125.0 lb

## 2022-05-04 DIAGNOSIS — K589 Irritable bowel syndrome without diarrhea: Secondary | ICD-10-CM

## 2022-05-04 DIAGNOSIS — D509 Iron deficiency anemia, unspecified: Secondary | ICD-10-CM

## 2022-05-04 DIAGNOSIS — K219 Gastro-esophageal reflux disease without esophagitis: Secondary | ICD-10-CM

## 2022-05-04 DIAGNOSIS — Z86010 Personal history of colonic polyps: Secondary | ICD-10-CM

## 2022-05-04 NOTE — Patient Instructions (Signed)
You have been scheduled for an endoscopy and colonoscopy. Please follow the written instructions given to you at your visit today. Please pick up your prep supplies at the pharmacy within the next 1-3 days. If you use inhalers (even only as needed), please bring them with you on the day of your procedure.  I appreciate the opportunity to care for you. Carl Gessner, MD, FACG 

## 2022-05-04 NOTE — Progress Notes (Signed)
Donna Murphy 77 y.o. 1945/10/14 [id number]  Assessment & Plan:   Encounter Diagnoses  Name Primary?   Iron deficiency anemia, unspecified iron deficiency anemia type Yes   Irritable bowel syndrome, unspecified type    Gastroesophageal reflux disease, unspecified whether esophagitis present    Hx of colonic polyps    Evaluate iron deficiency anemia with EGD and colonoscopy.  We will schedule this for May 10, 2022.  She will rearrange her hematology appointment.  She has requested no Dulcolax with her MiraLAX prep as that works well for her in the past when she takes Dulcolax the diarrhea was excessive.  The risks and benefits as well as alternatives of endoscopic procedure(s) have been discussed and reviewed. All questions answered. The patient agrees to proceed.  She has some dyspnea symptoms that are not surprising in the face of her current hemoglobin level.  She wants to continue to walk and I think that is okay but she should  modify as appropriate to reduce significant symptoms.  I do not think she is having any angina.  I appreciate the opportunity to care for this patient. CC: Tower, [name], MD   Subjective:   Chief Complaint: Iron deficiency anemia  HPI 77 year old white woman with a history of colon polyps who presents with a new diagnosis of iron deficiency anemia, son is present and participates.  Hemoglobin and CBC were normal a year ago and then this month she was found to have a microcytic anemia.  Denies any bleeding problems except 1 episode in the past few months of the sort of red gelatinous bleeding episode very small volume and self-limited.  She says she has been having some more heartburn and indigestion problems as well as gas with belching.  No early satiety dysphagia or difficulty eating.  Appetite is good.  Has a life long history of IBS.  A grandson had celiac disease and she has been tested for that and reports she is negative.  She does not donate  blood.  She does not restrict her diet. She walks 3 miles a day still she is avoiding hills she gets somewhat dyspneic but does not have any symptoms of angina.  She has noted some chest tightness when sitting in the chair in the evenings but this is relieved by removing her bra.  No known cardiac history.  Dr. Lovena Le increased her omeprazole and she is taking some famotidine at night at recent visit.  I reviewed January primary care visits.  She was having some belching at night as well and may move her bowels when she awakens.  Started iron, ferrous gluconate a week or so ago.  Colonoscopy history: 2007, diverticulosis no polyps, indication was history of prior polyps (elsewhere)  2017 2 polyps 3 and 7 mm in the ascending colon, pathology was sessile serrated polyp with a focus of high-grade dysplasia and a tubular adenoma  November 2019 no polyps persistent diverticulosis  No prior EGD     Latest Ref Rng & Units 05/02/2022   10:09 AM 04/20/2022   12:00 AM 04/13/2021   12:16 PM  CBC  WBC 4.0 - 10.5 K/uL 6.6   5.9   Hemoglobin 12.0 - 15.0 g/dL 7.5 Repeated and verified X2.  8.0     13.8   Hematocrit 36.0 - 46.0 % 24.0  28     42.9   Platelets 150.0 - 400.0 K/uL 465.0  540     362.0      This  result is from an external source.   Lab Results  Component Value Date   FERRITIN 2.0 (L) 05/02/2022    Allergies  Allergen Reactions   Ciprofloxacin     Arm numbness   Sulfonamide Derivatives     Hives, rash   Current Meds  Medication Sig   amLODipine (NORVASC) 5 MG tablet Take 1 tablet (5 mg total) by mouth daily.   aspirin 81 MG tablet Take 81 mg by mouth daily.   atorvastatin (LIPITOR) 40 MG tablet TAKE 1 TABLET BY MOUTH EVERY DAY   cetirizine (ZYRTEC) 10 MG tablet Take 10 mg by mouth daily.   Cholecalciferol (VITAMIN D) 2000 UNITS tablet Take 2,000 Units by mouth daily.   famotidine (PEPCID) 20 MG tablet TAKE 1 TABLET BY MOUTH EVERYDAY AT BEDTIME   FARXIGA 10 MG TABS tablet Take  10 mg by mouth daily.   ferrous gluconate (FERGON) 324 MG tablet Take 648 mg by mouth daily with breakfast.   glucose blood (ONETOUCH VERIO) test strip Ck blood sugar once daily and as directed. Dx E11.9   JANUVIA 100 MG tablet TAKE 1 TABLET BY MOUTH ONCE DAILY FOR 30 DAYS   Krill Oil 300 MG CAPS Take 1 capsule by mouth daily.   metFORMIN (GLUCOPHAGE) 500 MG tablet Take 1,000 mg by mouth 2 (two) times daily with a meal.   Multiple Vitamin (MULTIVITAMIN) tablet Take 1 tablet by mouth daily.   omeprazole (PRILOSEC) 40 MG capsule Take 1 capsule (40 mg total) by mouth daily.   psyllium (HYDROCIL/METAMUCIL) 95 % PACK Take 1 packet by mouth daily.   Past Medical History:  Diagnosis Date   Allergic rhinitis    Allergy    DMII (diabetes mellitus, type 2) ([facility])    Fibroids    uterine   GERD (gastroesophageal reflux disease)    HLD (hyperlipidemia)    HTN (hypertension)    Hx of colonic polyps 03/01/2016   Irritable bowel syndrome    Kidney stone    passed stone-no surgery required   Skin cancer of lip    basal cell   Varices of other sites    Past Surgical History:  Procedure Laterality Date   COLONOSCOPY  9/07   negative - Cia Garretson   ENDOMETRIAL BIOPSY  10/01   negative   TONSILLECTOMY     TOOTH EXTRACTION     Social History   Social History Narrative   Widowed, 2 sons 1 daughter.  1 son is married to an anesthesiologist.   Retired.   Never smoker   Alcohol 1 beverage 3 times a week   2 caffeinated beverages daily   No other tobacco no drug use   Walks for exercise; does yardwork too   family history includes Brain cancer in her sister; Coronary artery disease in her mother; Diabetes in her father and unknown relative; Heart attack in her mother; Heart failure in her father; Heart murmur in her son; Hyperlipidemia in her mother; Hypertension in her brother, father, and mother; Osteoporosis in her mother; Transient ischemic attack in her father.   Review of Systems As per  HPI, some allergy problems insomnia myalgia and some skin itching.  Otherwise negative.  Objective:   Physical Exam '@BP'$  136/74   Pulse (!) 101   Ht '5\' 3"'$  (1.6 m)   Wt 125 lb (56.7 kg)   BMI 22.14 kg/m @  General:  Well-developed, well-nourished and in no acute distress Eyes:  anicteric Lungs: Clear to auscultation bilaterally. Heart:  S1S2, no rubs, murmurs, gallops. Abdomen:  soft, non-tender, no hepatosplenomegaly, hernia, or mass and BS+.  Rectal: deferred Lymph:  no cervical or supraclavicular adenopathy. Extremities:   no edema, cyanosis or clubbing Skin   no rash. Somewhat pale Neuro:  A&O x 3.  Psych:  appropriate mood and  Affect.   Data Reviewed: See HPI

## 2022-05-05 ENCOUNTER — Telehealth: Payer: Self-pay | Admitting: Hematology and Oncology

## 2022-05-05 ENCOUNTER — Encounter (HOSPITAL_BASED_OUTPATIENT_CLINIC_OR_DEPARTMENT_OTHER): Payer: Self-pay

## 2022-05-05 ENCOUNTER — Telehealth: Payer: Self-pay | Admitting: Family Medicine

## 2022-05-05 ENCOUNTER — Other Ambulatory Visit: Payer: Self-pay

## 2022-05-05 ENCOUNTER — Observation Stay (HOSPITAL_BASED_OUTPATIENT_CLINIC_OR_DEPARTMENT_OTHER)
Admission: EM | Admit: 2022-05-05 | Discharge: 2022-05-06 | Disposition: A | Discharge status: Home / Self Care | Payer: Medicare Other | Attending: Internal Medicine | Admitting: Internal Medicine

## 2022-05-05 DIAGNOSIS — K219 Gastro-esophageal reflux disease without esophagitis: Secondary | ICD-10-CM | POA: Diagnosis not present

## 2022-05-05 DIAGNOSIS — R0602 Shortness of breath: Secondary | ICD-10-CM | POA: Diagnosis not present

## 2022-05-05 DIAGNOSIS — E1169 Type 2 diabetes mellitus with other specified complication: Secondary | ICD-10-CM | POA: Diagnosis present

## 2022-05-05 DIAGNOSIS — E119 Type 2 diabetes mellitus without complications: Secondary | ICD-10-CM | POA: Diagnosis not present

## 2022-05-05 DIAGNOSIS — R2681 Unsteadiness on feet: Secondary | ICD-10-CM | POA: Insufficient documentation

## 2022-05-05 DIAGNOSIS — E876 Hypokalemia: Secondary | ICD-10-CM | POA: Diagnosis not present

## 2022-05-05 DIAGNOSIS — D649 Anemia, unspecified: Secondary | ICD-10-CM

## 2022-05-05 DIAGNOSIS — Z85828 Personal history of other malignant neoplasm of skin: Secondary | ICD-10-CM | POA: Insufficient documentation

## 2022-05-05 DIAGNOSIS — Z79899 Other long term (current) drug therapy: Secondary | ICD-10-CM | POA: Diagnosis not present

## 2022-05-05 DIAGNOSIS — D509 Iron deficiency anemia, unspecified: Secondary | ICD-10-CM | POA: Diagnosis not present

## 2022-05-05 DIAGNOSIS — E1165 Type 2 diabetes mellitus with hyperglycemia: Secondary | ICD-10-CM | POA: Insufficient documentation

## 2022-05-05 DIAGNOSIS — Z7982 Long term (current) use of aspirin: Secondary | ICD-10-CM | POA: Diagnosis not present

## 2022-05-05 DIAGNOSIS — E785 Hyperlipidemia, unspecified: Secondary | ICD-10-CM | POA: Insufficient documentation

## 2022-05-05 DIAGNOSIS — Z7984 Long term (current) use of oral hypoglycemic drugs: Secondary | ICD-10-CM | POA: Diagnosis not present

## 2022-05-05 DIAGNOSIS — I10 Essential (primary) hypertension: Secondary | ICD-10-CM | POA: Insufficient documentation

## 2022-05-05 DIAGNOSIS — R42 Dizziness and giddiness: Secondary | ICD-10-CM | POA: Diagnosis not present

## 2022-05-05 LAB — ABO/RH: ABO/RH(D): A NEG

## 2022-05-05 LAB — COMPREHENSIVE METABOLIC PANEL
ALT: 61 U/L — ABNORMAL HIGH (ref 0–44)
AST: 41 U/L (ref 15–41)
Albumin: 4.5 g/dL (ref 3.5–5.0)
Alkaline Phosphatase: 52 U/L (ref 38–126)
Anion gap: 13 (ref 5–15)
BUN: 14 mg/dL (ref 8–23)
CO2: 22 mmol/L (ref 22–32)
Calcium: 9.4 mg/dL (ref 8.9–10.3)
Chloride: 107 mmol/L (ref 98–111)
Creatinine, Ser: 0.61 mg/dL (ref 0.44–1.00)
GFR, Estimated: >60 mL/min (ref >60)
Glucose, Bld: 159 mg/dL — ABNORMAL HIGH (ref 70–99)
Potassium: 3.8 mmol/L (ref 3.5–5.1)
Sodium: 142 mmol/L (ref 135–145)
Total Bilirubin: 0.3 mg/dL (ref 0.3–1.2)
Total Protein: 7 g/dL (ref 6.5–8.1)

## 2022-05-05 LAB — PROTIME-INR
INR: 0.9 (ref 0.8–1.2)
Prothrombin Time: 12.4 seconds (ref 11.4–15.2)

## 2022-05-05 LAB — CBC
HCT: 24.3 % — ABNORMAL LOW (ref 36.0–46.0)
Hemoglobin: 7.1 g/dL — ABNORMAL LOW (ref 12.0–15.0)
MCH: 20.3 pg — ABNORMAL LOW (ref 26.0–34.0)
MCHC: 29.2 g/dL — ABNORMAL LOW (ref 30.0–36.0)
MCV: 69.4 fL — ABNORMAL LOW (ref 80.0–100.0)
Platelets: 478 10*3/uL — ABNORMAL HIGH (ref 150–400)
RBC: 3.5 MIL/uL — ABNORMAL LOW (ref 3.87–5.11)
RDW: 17.3 % — ABNORMAL HIGH (ref 11.5–15.5)
WBC: 8.6 10*3/uL (ref 4.0–10.5)
nRBC: 0 % (ref 0.0–0.2)

## 2022-05-05 LAB — URINALYSIS, ROUTINE W REFLEX MICROSCOPIC
Bacteria, UA: NONE SEEN
Bilirubin Urine: NEGATIVE
Glucose, UA: >1000 mg/dL — AB
Hgb urine dipstick: NEGATIVE
Ketones, ur: NEGATIVE mg/dL
Leukocytes,Ua: NEGATIVE
Nitrite: NEGATIVE
Protein, ur: NEGATIVE mg/dL
Specific Gravity, Urine: 1.007 (ref 1.005–1.030)
pH: 5 (ref 5.0–8.0)

## 2022-05-05 LAB — PREPARE RBC (CROSSMATCH)

## 2022-05-05 LAB — OCCULT BLOOD X 1 CARD TO LAB, STOOL: Fecal Occult Bld: NEGATIVE

## 2022-05-05 MED ORDER — SODIUM CHLORIDE 0.9% IV SOLUTION: Freq: Once | INTRAVENOUS | Status: DC

## 2022-05-05 MED ORDER — LINAGLIPTIN 5 MG PO TABS
5.0000 mg | ORAL_TABLET | Freq: Every day | ORAL | Status: DC
  Administered 2022-05-06: 5 mg via ORAL
  Filled 2022-05-05: qty 1

## 2022-05-05 MED ORDER — AMLODIPINE BESYLATE 5 MG PO TABS
5.0000 mg | ORAL_TABLET | Freq: Every day | ORAL | Status: DC
  Administered 2022-05-06: 5 mg via ORAL
  Filled 2022-05-05: qty 1

## 2022-05-05 MED ORDER — DAPAGLIFLOZIN PROPANEDIOL 10 MG PO TABS
10.0000 mg | ORAL_TABLET | Freq: Every day | ORAL | Status: DC
  Administered 2022-05-06: 10 mg via ORAL
  Filled 2022-05-05: qty 1

## 2022-05-05 MED ORDER — PANTOPRAZOLE SODIUM 40 MG PO TBEC
40.0000 mg | DELAYED_RELEASE_TABLET | Freq: Every evening | ORAL | Status: DC
  Administered 2022-05-05: 40 mg via ORAL
  Filled 2022-05-05: qty 1

## 2022-05-05 MED ORDER — ATORVASTATIN CALCIUM 40 MG PO TABS
40.0000 mg | ORAL_TABLET | Freq: Every evening | ORAL | Status: DC
  Administered 2022-05-05: 40 mg via ORAL
  Filled 2022-05-05: qty 1

## 2022-05-05 MED ORDER — SODIUM CHLORIDE 0.9% FLUSH
3.0000 mL | Freq: Two times a day (BID) | INTRAVENOUS | Status: DC
  Administered 2022-05-06: 3 mL via INTRAVENOUS

## 2022-05-05 MED ORDER — FAMOTIDINE 20 MG PO TABS
20.0000 mg | ORAL_TABLET | Freq: Every day | ORAL | Status: DC
  Administered 2022-05-05: 20 mg via ORAL
  Filled 2022-05-05: qty 1

## 2022-05-05 MED ORDER — POLYETHYLENE GLYCOL 3350 17 G PO PACK: 17.0000 g | PACK | Freq: Every day | ORAL | Status: DC | PRN

## 2022-05-05 MED ORDER — METFORMIN HCL 500 MG PO TABS
500.0000 mg | ORAL_TABLET | Freq: Two times a day (BID) | ORAL | Status: DC
  Administered 2022-05-06: 500 mg via ORAL
  Filled 2022-05-05: qty 1

## 2022-05-05 MED ORDER — ACETAMINOPHEN 650 MG RE SUPP: 650.0000 mg | Freq: Four times a day (QID) | RECTAL | Status: DC | PRN

## 2022-05-05 MED ORDER — ACETAMINOPHEN 325 MG PO TABS: 650.0000 mg | ORAL_TABLET | Freq: Four times a day (QID) | ORAL | Status: DC | PRN

## 2022-05-05 NOTE — H&P (Incomplete)
History and Physical   Donna Murphy ZTI:[id number] DOB: 1945-09-06 DOA: 05/05/2022  PCP: Abner Greenspan, MD   Patient coming from: Home  Chief Complaint: Dizziness, shortness of breath, anemia  HPI: Donna Murphy is a 77 y.o. female with medical history significant of hypertension, hyperlipidemia, GERD, diabetes, fibroids, iron deficiency anemia presenting with shortness of breath, dizziness, known anemia.  Patient has had ongoing dyspnea on exertion for the past 6 months as well light headedness/dizziness after leaning over and standing back up (initially had attributed dizziness to possible side effect of medication but it persisted).  Noted to have anemia last month during outpatient workup.  In the last couple weeks hemoglobin is dropped from 8 >> 7.5 >> 7.1.  She is scheduled for outpatient workup, thus far showing microcytic anemia with low ferritin. Has endoscopy scheduled with GI already.  Today she had a change in symptoms in that she used to get lightheaded that resolves after a couple minutes now her lightheadedness is persistent.  Denies any rectal bleeding or dark stools.  Denies fevers, chills, chest pain, abdominal pain, constipation, diarrhea, nausea, vomiting.  Has known iron deficiency anemia and has been worked up outpatient and following with GI with plan for colonoscopy.  ED Course: Vital signs in the ED significant for blood pressure in the 825K to 539J systolic.  Lab workup included CMP with glucose 159, ALT 61.  CBC with hemoglobin 7.1 down from 7.53 days ago and 8 2 weeks ago.  Platelets 478.  PT and INR within normal limits.  FOBT negative.  Urinalysis with glucose only.  Iron studies from a couple days ago showed iron of 135 and ferritin of 2.0.  No intervention in the ED/med center as they do not have nonemergent blood transfusion available.  Patient admitted for transfusion.  Review of Systems: As per HPI otherwise all other systems reviewed and are negative.  Past  Medical History:  Diagnosis Date  . Allergic rhinitis   . Allergy   . DMII (diabetes mellitus, type 2) ([place])   . Fibroids    uterine  . GERD (gastroesophageal reflux disease)   . HLD (hyperlipidemia)   . HTN (hypertension)   . Hx of colonic polyps 03/01/2016  . Irritable bowel syndrome   . Kidney stone    passed stone-no surgery required  . Skin cancer of lip    basal cell  . Varices of other sites     Past Surgical History:  Procedure Laterality Date  . COLONOSCOPY  9/07   negative - gessner  . ENDOMETRIAL BIOPSY  10/01   negative  . TONSILLECTOMY    . TOOTH EXTRACTION      Social History  reports that she has never smoked. She has never used smokeless tobacco. She reports current alcohol use of about 7.0 - 10.0 standard drinks of alcohol per week. She reports that she does not use drugs.  Allergies  Allergen Reactions  . Ciprofloxacin     Arm numbness  . Sulfonamide Derivatives     Hives, rash    Family History  Problem Relation Age of Onset  . Coronary artery disease Mother   . Hyperlipidemia Mother   . Hypertension Mother   . Osteoporosis Mother   . Heart attack Mother        CABG in her 31's  . Diabetes Father   . Heart failure Father   . Hypertension Father   . Transient ischemic attack Father   . Diabetes Unknown  GF  . Hypertension Brother   . Heart murmur Son   . Brain cancer Sister   . Colon cancer Neg Hx   . Esophageal cancer Neg Hx   . Stomach cancer Neg Hx   . Rectal cancer Neg Hx   Reviewed on admission  Prior to Admission medications   Medication Sig Start Date End Date Taking? Authorizing Provider  amLODipine (NORVASC) 5 MG tablet Take 1 tablet (5 mg total) by mouth daily. 04/14/22   Tower, [name], MD  aspirin 81 MG tablet Take 81 mg by mouth daily.    [provider]  atorvastatin (LIPITOR) 40 MG tablet TAKE 1 TABLET BY MOUTH EVERY DAY 03/07/22   Tower, [name], MD  cetirizine (ZYRTEC) 10 MG tablet Take 10 mg by mouth  daily.    [provider]  Cholecalciferol (VITAMIN D) 2000 UNITS tablet Take 2,000 Units by mouth daily.    [provider]  famotidine (PEPCID) 20 MG tablet TAKE 1 TABLET BY MOUTH EVERYDAY AT BEDTIME Patient taking differently: Take 20 mg by mouth at bedtime. 03/07/22   Tower, [name], MD  FARXIGA 10 MG TABS tablet Take 10 mg by mouth daily. 05/07/17   [provider]  ferrous gluconate (FERGON) 324 MG tablet Take 648 mg by mouth daily with breakfast.    [provider]  glucose blood (ONETOUCH VERIO) test strip Ck blood sugar once daily and as directed. Dx E11.9 08/11/15   Tower, [name], MD  JANUVIA 100 MG tablet Take 100 mg by mouth daily. 08/23/16   [provider]  Javier Docker Oil 300 MG CAPS Take 1 capsule by mouth daily.    [provider]  metFORMIN (GLUCOPHAGE) 500 MG tablet Take 1,000 mg by mouth 2 (two) times daily with a meal.    [provider]  Multiple Vitamin (MULTIVITAMIN) tablet Take 1 tablet by mouth daily.    [provider]  NON FORMULARY     [provider]  omeprazole (PRILOSEC) 40 MG capsule Take 1 capsule (40 mg total) by mouth daily. 04/14/22   Tower, Marne A, MD  psyllium (HYDROCIL/METAMUCIL) 95 % PACK Take 1 packet by mouth daily.    [provider]  triamcinolone cream (KENALOG) 0.1 % Apply 1 Application topically daily as needed. Patient not taking: Reported on 05/04/2022    [provider]    Physical Exam: Vitals:   05/05/22 1226 05/05/22 1321 05/05/22 1600 05/05/22 1647  BP: (!) 159/77  138/63 (!) 145/66  Pulse: 100  91 86  Resp: '16  20 14  '$ Temp: 98 F (36.7 C)  98 F (36.7 C) 99 F (37.2 C)  TempSrc: Oral  Oral Oral  SpO2: 99%  95% 99%  Weight:  56.7 kg  58 kg  Height:  '5\' 3"'$  (1.6 m)  5' 3.5" (1.613 m)    Physical Exam Constitutional:      General: She is not in acute distress.    Appearance: Normal appearance.  HENT:     Head: Normocephalic and atraumatic.      Mouth/Throat:     Mouth: Mucous membranes are moist.     Pharynx: Oropharynx is clear.  Eyes:     Extraocular Movements: Extraocular movements intact.     Pupils: Pupils are equal, round, and reactive to light.  Cardiovascular:     Rate and Rhythm: Normal rate and regular rhythm.     Pulses: Normal pulses.     Heart sounds: Normal  heart sounds.  Pulmonary:     Effort: Pulmonary effort is normal. No respiratory distress.     Breath sounds: Normal breath sounds.  Abdominal:     General: Bowel sounds are normal. There is no distension.     Palpations: Abdomen is soft.     Tenderness: There is no abdominal tenderness.  Musculoskeletal:        General: No swelling or deformity.  Skin:    General: Skin is warm and dry.  Neurological:     General: No focal deficit present.     Mental Status: Mental status is at baseline.    Labs on Admission: I have personally reviewed following labs and imaging studies  CBC: Recent Labs  Lab 05/02/22 1009 05/05/22 1252  WBC 6.6 8.6  NEUTROABS 4.6  --   HGB 7.5 Repeated and verified X2.* 7.1*  HCT 24.0* 24.3*  MCV 66.7 Repeated and verified X2.* 69.4*  PLT 465.0* 478*    Basic Metabolic Panel: Recent Labs  Lab 05/05/22 1252  NA 142  K 3.8  CL 107  CO2 22  GLUCOSE 159*  BUN 14  CREATININE 0.61  CALCIUM 9.4    GFR: Estimated Creatinine Clearance: 51.4 mL/min (by C-G formula based on SCr of 0.61 mg/dL).  Liver Function Tests: Recent Labs  Lab 05/05/22 1252  AST 41  ALT 61*  ALKPHOS 52  BILITOT 0.3  PROT 7.0  ALBUMIN 4.5    Urine analysis:    Component Value Date/Time   COLORURINE YELLOW 05/05/2022 1252   APPEARANCEUR CLEAR 05/05/2022 1252   LABSPEC 1.007 05/05/2022 1252   PHURINE 5.0 05/05/2022 1252   GLUCOSEU >1,000 (A) 05/05/2022 1252   HGBUR NEGATIVE 05/05/2022 1252   HGBUR negative 10/23/2006 1155   BILIRUBINUR NEGATIVE 05/05/2022 1252   BILIRUBINUR negative 06/19/2019 1146   KETONESUR NEGATIVE  05/05/2022 1252   PROTEINUR NEGATIVE 05/05/2022 1252   UROBILINOGEN 0.2 06/19/2019 1146   UROBILINOGEN 0.2 10/23/2006 1155   NITRITE NEGATIVE 05/05/2022 1252   LEUKOCYTESUR NEGATIVE 05/05/2022 1252    Radiological Exams on Admission: No results found.  EKG: Independently reviewed.  Sinus rhythm at 98 bpm.  Significant baseline wander.  Low voltage multiple leads.  Nonspecific ST changes.  Assessment/Plan Principal Problem:   Symptomatic anemia Active Problems:   DM (diabetes mellitus), type 2 ([place])   Essential hypertension   GERD   Hyperlipidemia associated with type 2 diabetes mellitus (H[facility])   Microcytic anemia   Symptomatic anemia Iron deficiency anemia > Known history of iron-deficiency anemia being worked up outpatient.  He is following with GI and there is a plan for colonoscopy.  No dark or bloody stools.  FOBT negative. > Having more symptoms from her anemia has had 6 months of dyspnea on exertion but today she is having persistent lightheadedness on standing when before it used to resolve after 1 to 2 minutes. > Hemoglobin 7.1 in the ED down from 8 2 weeks ago. - Monitor on telemetry - Transfuse 1 unit - Consider iron infusion after blood infusion - Trend CBC  Hypertension - Continue home amlodipine  Hyperlipidemia - Continue home atorvastatin  Diabetes -Continue home  GERD - Continue PPI and Pepcid ***  DVT prophylaxis: SCDs for now (unclear if could be having intermittent bleeding) Code Status:   Full,*** Family Communication:  ***  Disposition Plan:   Patient is from:  Home  Anticipated DC to:  Home  Anticipated DC date:  1 to 2 days  Anticipated DC barriers:  None  Consults called:  None Admission status:  Observation, telemetry  Severity of Illness: The appropriate patient status for this patient is OBSERVATION. Observation status is judged to be reasonable and necessary in order to provide the required intensity of service to ensure the patient's  safety. The patient's presenting symptoms, physical exam findings, and initial radiographic and laboratory data in the context of their medical condition is felt to place them at decreased risk for further clinical deterioration. Furthermore, it is anticipated that the patient will be medically stable for discharge from the hospital within 2 midnights of admission.    Marcelyn Bruins MD [facility]  How to contact the [facility] Attending or Consulting provider [place] or covering provider during after hours [place], for this patient?   Check the care team in [facility] and look for a) attending/consulting TRH provider listed and b) the [facility] team listed Log into [email or web address] and use [place]'s universal password to access. If you do not have the password, please contact the hospital operator. Locate the [facility] provider you are looking for under [facility] and page to a number that you can be directly reached. If you still have difficulty reaching the provider, please page the [facility] (Director on Call) for the Hospitalists listed on amion for assistance.  05/05/2022, 5:39 PM

## 2022-05-05 NOTE — Telephone Encounter (Signed)
Patient called in to let Dr Glori Bickers know that she is more dizzy today,so she is headed over to the ED to get checked out

## 2022-05-05 NOTE — H&P (Signed)
History and Physical    Donna Murphy [id number]:[id number] DOB: Nov 17, 1945 DOA: 05/05/2022   PCP: Abner Greenspan, MD    Patient coming from: Home   Chief Complaint: Dizziness, shortness of breath, anemia   HPI: Donna Murphy is a 77 y.o. female with medical history significant of hypertension, hyperlipidemia, GERD, diabetes, fibroids, iron deficiency anemia presenting with shortness of breath, dizziness, known anemia.   Patient has had ongoing dyspnea on exertion for the past 6 months as well light headedness/dizziness after leaning over and standing back up (initially had attributed dizziness to possible side effect of medication but it persisted).  Noted to have anemia last month during outpatient workup.  In the last couple weeks hemoglobin is dropped from 8 >> 7.5 >> 7.1.  She is scheduled for outpatient workup, thus far showing microcytic anemia with low ferritin. Has endoscopy scheduled with GI already.   Today she had a change in symptoms in that she used to get lightheaded that resolves after a couple minutes now her lightheadedness is persistent.  Denies any rectal bleeding or dark stools.   Denies fevers, chills, chest pain, abdominal pain, constipation, diarrhea, nausea, vomiting.   Has known iron deficiency anemia and has been worked up outpatient and following with GI with plan for colonoscopy.   ED Course: Vital signs in the ED significant for blood pressure in the 448J to 856D systolic.  Lab workup included CMP with glucose 159, ALT 61.  CBC with hemoglobin 7.1 down from 7.53 days ago and 8 2 weeks ago.  Platelets 478.  PT and INR within normal limits.  FOBT negative.  Urinalysis with glucose only.  Iron studies from a couple days ago showed iron of 135 and ferritin of 2.0.  No intervention in the ED/med center as they do not have nonemergent blood transfusion available.  Patient admitted for transfusion.   Review of Systems: As per HPI otherwise all other systems reviewed and are  negative.       Past Medical History:  Diagnosis Date   Allergic rhinitis     Allergy     DMII (diabetes mellitus, type 2) (HCC)     Fibroids      uterine   GERD (gastroesophageal reflux disease)     HLD (hyperlipidemia)     HTN (hypertension)     Hx of colonic polyps 03/01/2016   Irritable bowel syndrome     Kidney stone      passed stone-no surgery required   Skin cancer of lip      basal cell   Varices of other sites             Past Surgical History:  Procedure Laterality Date   COLONOSCOPY   9/07    negative - gessner   ENDOMETRIAL BIOPSY   10/01    negative   TONSILLECTOMY       TOOTH EXTRACTION          Social History  reports that she has never smoked. She has never used smokeless tobacco. She reports current alcohol use of about 7.0 - 10.0 standard drinks of alcohol per week. She reports that she does not use drugs.        Allergies  Allergen Reactions   Ciprofloxacin        Arm numbness   Sulfonamide Derivatives        Hives, rash           Family History  Problem Relation Age of  Onset   Coronary artery disease Mother     Hyperlipidemia Mother     Hypertension Mother     Osteoporosis Mother     Heart attack Mother          CABG in her 53's   Diabetes Father     Heart failure Father     Hypertension Father     Transient ischemic attack Father     Diabetes Unknown          GF   Hypertension Brother     Heart murmur Son     Brain cancer Sister     Colon cancer Neg Hx     Esophageal cancer Neg Hx     Stomach cancer Neg Hx     Rectal cancer Neg Hx    Reviewed on admission          Prior to Admission medications   Medication Sig Start Date End Date Taking? Authorizing Provider  amLODipine (NORVASC) 5 MG tablet Take 1 tablet (5 mg total) by mouth daily. 04/14/22     Tower, [name], MD  aspirin 81 MG tablet Take 81 mg by mouth daily.       [provider]  atorvastatin (LIPITOR) 40 MG tablet TAKE 1 TABLET BY MOUTH EVERY DAY 03/07/22      Tower, [name], MD  cetirizine (ZYRTEC) 10 MG tablet Take 10 mg by mouth daily.       [provider]  Cholecalciferol (VITAMIN D) 2000 UNITS tablet Take 2,000 Units by mouth daily.       [provider]  famotidine (PEPCID) 20 MG tablet TAKE 1 TABLET BY MOUTH EVERYDAY AT BEDTIME Patient taking differently: Take 20 mg by mouth at bedtime. 03/07/22     Tower, [name], MD  FARXIGA 10 MG TABS tablet Take 10 mg by mouth daily. 05/07/17     [provider]  ferrous gluconate (FERGON) 324 MG tablet Take 648 mg by mouth daily with breakfast.       [provider]  glucose blood (ONETOUCH VERIO) test strip Ck blood sugar once daily and as directed. Dx E11.9 08/11/15     Tower, [name], MD  JANUVIA 100 MG tablet Take 100 mg by mouth daily. 08/23/16     [provider]  Javier Docker Oil 300 MG CAPS Take 1 capsule by mouth daily.       [provider]  metFORMIN (GLUCOPHAGE) 500 MG tablet Take 1,000 mg by mouth 2 (two) times daily with a meal.       [provider]  Multiple Vitamin (MULTIVITAMIN) tablet Take 1 tablet by mouth daily.       [provider]  NON FORMULARY         [provider]  omeprazole (PRILOSEC) 40 MG capsule Take 1 capsule (40 mg total) by mouth daily. 04/14/22     Tower, Marne A, MD  psyllium (HYDROCIL/METAMUCIL) 95 % PACK Take 1 packet by mouth daily.       [provider]  triamcinolone cream (KENALOG) 0.1 % Apply 1 Application topically daily as needed. Patient not taking: Reported on 05/04/2022       [provider]      Physical Exam:       Vitals:    05/05/22 1226 05/05/22 1321 05/05/22 1600 05/05/22 1647  BP: (!) 159/77   138/63 (!) 145/66  Pulse: 100   91 86  Resp: 16   20  14  Temp: 98 F (36.7 C)   98 F (36.7 C) 99 F (37.2 C)  TempSrc: Oral   Oral Oral  SpO2: 99%   95% 99%  Weight:   56.7 kg   58 kg  Height:   '5\' 3"'$  (1.6 m)   5' 3.5" (1.613 m)      Physical  Exam Constitutional:      General: She is not in acute distress.    Appearance: Normal appearance.  HENT:     Head: Normocephalic and atraumatic.     Mouth/Throat:     Mouth: Mucous membranes are moist.     Pharynx: Oropharynx is clear.  Eyes:     Extraocular Movements: Extraocular movements intact.     Pupils: Pupils are equal, round, and reactive to light.  Cardiovascular:     Rate and Rhythm: Normal rate and regular rhythm.     Pulses: Normal pulses.     Heart sounds: Normal heart sounds.  Pulmonary:     Effort: Pulmonary effort is normal. No respiratory distress.     Breath sounds: Normal breath sounds.  Abdominal:     General: Bowel sounds are normal. There is no distension.     Palpations: Abdomen is soft.     Tenderness: There is no abdominal tenderness.  Musculoskeletal:        General: No swelling or deformity.  Skin:    General: Skin is warm and dry.  Neurological:     General: No focal deficit present.     Mental Status: Mental status is at baseline.        Labs on Admission: I have personally reviewed following labs and imaging studies   CBC: Last Labs       Recent Labs  Lab 05/02/22 1009 05/05/22 1252  WBC 6.6 8.6  NEUTROABS 4.6  --   HGB 7.5 Repeated and verified X2.* 7.1*  HCT 24.0* 24.3*  MCV 66.7 Repeated and verified X2.* 69.4*  PLT 465.0* 478*        Basic Metabolic Panel: Last Labs      Recent Labs  Lab 05/05/22 1252  NA 142  K 3.8  CL 107  CO2 22  GLUCOSE 159*  BUN 14  CREATININE 0.61  CALCIUM 9.4        GFR: Estimated Creatinine Clearance: 51.4 mL/min (by C-G formula based on SCr of 0.61 mg/dL).   Liver Function Tests: Last Labs      Recent Labs  Lab 05/05/22 1252  AST 41  ALT 61*  ALKPHOS 52  BILITOT 0.3  PROT 7.0  ALBUMIN 4.5        Urine analysis: Labs (Brief)          Component Value Date/Time    COLORURINE YELLOW 05/05/2022 [place] 05/05/2022 1252    LABSPEC 1.007 05/05/2022 1252     PHURINE 5.0 05/05/2022 1252    GLUCOSEU >1,000 (A) 05/05/2022 1252    HGBUR NEGATIVE 05/05/2022 1252    HGBUR negative 10/23/2006 1155    BILIRUBINUR NEGATIVE 05/05/2022 1252    BILIRUBINUR negative 06/19/2019 1146    KETONESUR NEGATIVE 05/05/2022 1252    PROTEINUR NEGATIVE 05/05/2022 1252    UROBILINOGEN 0.2 06/19/2019 1146    UROBILINOGEN 0.2 10/23/2006 1155    NITRITE NEGATIVE 05/05/2022 1252    LEUKOCYTESUR NEGATIVE 05/05/2022 1252        Radiological Exams on Admission: Imaging Results (Last 48 hours)  No results found.  EKG: Independently reviewed.  Sinus rhythm at 98 bpm.  Significant baseline wander.  Low voltage multiple leads.  Nonspecific ST changes.   Assessment/Plan Principal Problem:   Symptomatic anemia Active Problems:   DM (diabetes mellitus), type 2 ([place])   Essential hypertension   GERD   Hyperlipidemia associated with type 2 diabetes mellitus (HCC)   Microcytic anemia   Symptomatic anemia Iron deficiency anemia > Known history of iron-deficiency anemia being worked up outpatient.  He is following with GI and there is a plan for colonoscopy.  No dark or bloody stools.  FOBT negative. > Having more symptoms from her anemia has had 6 months of dyspnea on exertion but today she is having persistent lightheadedness on standing when before it used to resolve after 1 to 2 minutes. > Hemoglobin 7.1 in the ED down from 8 2 weeks ago. - Monitor on telemetry - Transfuse 1 unit - Consider iron infusion after blood infusion - Trend CBC   Hypertension - Continue home amlodipine   Hyperlipidemia - Continue home atorvastatin   Diabetes - Continue home metformin, Farxiga, Januvia   GERD - Continue PPI and Pepcid    DVT prophylaxis:      SCDs for now (unclear if could be having intermittent bleeding) Code Status:              Full Family Communication:       Updated at bedside  Disposition Plan:              Patient is from:                         Home             Anticipated DC to:                   Home             Anticipated DC date:               1 to 2 days             Anticipated DC barriers:         None    Consults called:        None Admission status:     Observation, telemetry   Severity of Illness: The appropriate patient status for this patient is OBSERVATION. Observation status is judged to be reasonable and necessary in order to provide the required intensity of service to ensure the patient's safety. The patient's presenting symptoms, physical exam findings, and initial radiographic and laboratory data in the context of their medical condition is felt to place them at decreased risk for further clinical deterioration. Furthermore, it is anticipated that the patient will be medically stable for discharge from the hospital within 2 midnights of admission.      Marcelyn Bruins MD [facility]   How to contact the [facility] Attending or Consulting provider [place] or covering provider during after hours [place], for this patient?    Check the care team in [facility] and look for a) attending/consulting TRH provider listed and b) the [facility] team listed Log into [email or web address] and use [place]'s universal password to access. If you do not have the password, please contact the hospital operator. Locate the [facility] provider you are looking for under [facility] and page to a number that you can be directly reached. If you still have difficulty reaching  the provider, please page the [facility] (Director on Call) for the Hospitalists listed on amion for assistance.   05/05/2022, 5:39 PM

## 2022-05-05 NOTE — ED Notes (Signed)
[facility] at [facility] will send transport for [facility] Bed Ready 5W Rm#12.-ABB(NS)

## 2022-05-05 NOTE — Telephone Encounter (Signed)
I got in touch with her over secure chat and the are sending to [facility] to transfuse

## 2022-05-05 NOTE — ED Provider Notes (Signed)
[facility] Provider Note   CSN: [id number] Arrival date & time: 05/05/22  1211     History  Chief Complaint  Patient presents with   Shortness of Breath   Dizziness    Donna Murphy is a 77 y.o. female.  HPI 77 year old female presents today with lightheadedness.  She reports that she has had some dyspnea with exertion over the past 6 months.  She was found to have a low hemoglobin in the middle of January.  Had decreased from 8-7 over approximately a week.  She is scheduled for outpatient workup and is to see my and have colonoscopy next week.  Today she had sudden change in that when she stands up brings her head back up she has been getting lightheaded but usually resolves after couple minutes today she continued to feel lightheaded.  She denies any headache, head injury, chest pain.  She has had some dyspnea on exertion but was able to walk her usual route yesterday and did not have any significant lightheadedness or dyspnea.  She has not noted any bleeding.  She does not think she has had gross blood in her stool.  Her stool has not previously been checked.     Home Medications Prior to Admission medications   Medication Sig Start Date End Date Taking? Authorizing Provider  amLODipine (NORVASC) 5 MG tablet Take 1 tablet (5 mg total) by mouth daily. 04/14/22   Tower, [name], MD  aspirin 81 MG tablet Take 81 mg by mouth daily.    [provider]  atorvastatin (LIPITOR) 40 MG tablet TAKE 1 TABLET BY MOUTH EVERY DAY 03/07/22   Tower, [name], MD  cetirizine (ZYRTEC) 10 MG tablet Take 10 mg by mouth daily.    [provider]  Cholecalciferol (VITAMIN D) 2000 UNITS tablet Take 2,000 Units by mouth daily.    [provider]  famotidine (PEPCID) 20 MG tablet TAKE 1 TABLET BY MOUTH EVERYDAY AT BEDTIME 03/07/22   Tower, Marne A, MD  FARXIGA 10 MG TABS tablet Take 10 mg by mouth daily. 05/07/17   [provider]   ferrous gluconate (FERGON) 324 MG tablet Take 648 mg by mouth daily with breakfast.    [provider]  glucose blood (ONETOUCH VERIO) test strip Ck blood sugar once daily and as directed. Dx E11.9 08/11/15   Tower, [name], MD  JANUVIA 100 MG tablet TAKE 1 TABLET BY MOUTH ONCE DAILY FOR 30 DAYS 08/23/16   [provider]  Javier Docker Oil 300 MG CAPS Take 1 capsule by mouth daily.    [provider]  metFORMIN (GLUCOPHAGE) 500 MG tablet Take 1,000 mg by mouth 2 (two) times daily with a meal.    [provider]  Multiple Vitamin (MULTIVITAMIN) tablet Take 1 tablet by mouth daily.    [provider]  NON FORMULARY     [provider]  omeprazole (PRILOSEC) 40 MG capsule Take 1 capsule (40 mg total) by mouth daily. 04/14/22   Tower, Marne A, MD  psyllium (HYDROCIL/METAMUCIL) 95 % PACK Take 1 packet by mouth daily.    [provider]  triamcinolone cream (KENALOG) 0.1 % Apply 1 Application topically daily as needed. Patient not taking: Reported on 05/04/2022    [provider]      Allergies    Ciprofloxacin and Sulfonamide derivatives    Review of Systems   Review of Systems  Physical Exam Updated Vital Signs BP [name])  159/77 (BP Location: Left Arm)   Pulse 100   Temp 98 F (36.7 C) (Oral)   Resp 16   Ht 1.6 m ('5\' 3"'$ )   Wt 56.7 kg   SpO2 99%   BMI 22.14 kg/m  Physical Exam Vitals reviewed.  Constitutional:      Appearance: She is well-developed.  HENT:     Head: Normocephalic.     Mouth/Throat:     Mouth: Mucous membranes are moist.  Eyes:     Pupils: Pupils are equal, round, and reactive to light.  Cardiovascular:     Rate and Rhythm: Normal rate and regular rhythm.  Pulmonary:     Effort: Pulmonary effort is normal.  Abdominal:     General: Bowel sounds are normal.     Palpations: Abdomen is soft. There is no mass.  Genitourinary:    Comments: Rectal performed scant amount of stool noted Musculoskeletal:         General: Normal range of motion.     Cervical back: Normal range of motion.  Skin:    General: Skin is warm and dry.     Capillary Refill: Capillary refill takes less than 2 seconds.     Comments: Some maculopapular rash noted on upper chest  Neurological:     General: No focal deficit present.     Mental Status: She is alert.  Psychiatric:        Mood and Affect: Mood normal.     ED Results / Procedures / Treatments   Labs (all labs ordered are listed, but only abnormal results are displayed) Labs Reviewed  CBC - Abnormal; Notable for the following components:      Result Value   RBC 3.50 (*)    Hemoglobin 7.1 (*)    HCT 24.3 (*)    MCV 69.4 (*)    MCH 20.3 (*)    MCHC 29.2 (*)    RDW 17.3 (*)    Platelets 478 (*)    All other components within normal limits  URINALYSIS, ROUTINE W REFLEX MICROSCOPIC - Abnormal; Notable for the following components:   Glucose, UA >1,000 (*)    All other components within normal limits  COMPREHENSIVE METABOLIC PANEL - Abnormal; Notable for the following components:   Glucose, Bld 159 (*)    ALT 61 (*)    All other components within normal limits  OCCULT BLOOD X 1 CARD TO LAB, STOOL  PROTIME-INR    EKG EKG Interpretation  Date/Time:  Friday May 05 2022 12:27:15 EST Ventricular Rate:  98 PR Interval:  138 QRS Duration: 104 QT Interval:  353 QTC Calculation: 451 R Axis:   42 Text Interpretation: Sinus rhythm Low voltage, extremity leads Confirmed by Pattricia Boss [phone]) on 05/05/2022 2:13:36 PM  Radiology No results found.  Procedures Procedures    Medications Ordered in ED Medications - No data to display  ED Course/ Medical Decision Making/ A&P Clinical Course as of 05/05/22 1444  Fri May 05, 2022  1335 CBC reviewed interpreted significant for anemia with hemoglobin of 7.1 [DR]  1335 Platelets are 478,000 White blood cell count is normal [DR]  1335 Hemoccult was negative [DR]  1335 INR is normal at 0.9 [DR]   2774 Complete metabolic panel is reviewed interpreted significant for hyperglycemia with glucose of 159 and ALT of 61 otherwise within normal limits [DR]    Clinical Course User Index [DR] Pattricia Boss, MD  Medical Decision Making Amount and/or Complexity of Data Reviewed Labs: ordered.   Discussed risk benefits with the patient and son at bedside.  Patient's hemoglobin has decreased to 7.1.  This has been somewhat gradual.  Here she is hemodynamically stable but is having some symptoms of lightheadedness.  I have attempted to contact Dr. Glori Bickers via epic chat. Discussed via chat with Dr. Glori Bickers and with patient's daughter-in-law who is an anesthesiologist.  Plan admission for transfusion Care discussed with Dr.  Tamala Julian.  Will place admission orders        Final Clinical Impression(s) / ED Diagnoses Final diagnoses:  Symptomatic anemia    Rx / DC Orders ED Discharge Orders     None         Pattricia Boss, MD 05/05/22 1444

## 2022-05-05 NOTE — Progress Notes (Signed)
Blood consent signed and placed in chart.

## 2022-05-05 NOTE — ED Triage Notes (Signed)
Pt presents POV today from home. Pt reports she has been having dizziness when bending over that usually subsides immediately after standing upright since August and SOB x2 months that has gradually gotten worse with exertion the last few weeks.   Pt is currently seeing an endodontologist, hematologist, gastroenterologist per her PCP referral.   Pt noted to have low Hgb with PCP which is why she was referred to these specialist. PCP called her with an abnormal Hgb of 7.5, 3 days earlier it Hgb 8.0.  Pt presents to ED today due to dizziness "lingering"   Pt in NAD, speaking complete sentences, ambulatory from triage to room.

## 2022-05-05 NOTE — Progress Notes (Signed)
Pt arrived to floor around 1630 from [facility] ED via stretcher by [facility]. Pt alert and oriented x4 in no acute distress upon arrival. Pt amb from stretcher to hospital bed with steady gait. VSS. Respirations even and unlabored on room air. Pt oriented to room. Pt instructed not to get up unassisted and to call for assistance. Call bell within reach. Bed in low position.

## 2022-05-05 NOTE — Plan of Care (Signed)
Transfer from [place] 77 y/o Female pmh of HTN, HLD, nephrolithiasis, DM type II, fibroids, and GERD who presented for symptomatic anemia.  Hemoglobin 7.1 g/dL with stool guaiacs negative.  Plan is for outpatient follow-up with Dr. Carlean Purl next week for further workup.  Blood pressures stable.  Excepted to a medical telemetry bed for for transfusion of blood here at [facility] as blood is not available at their facility.

## 2022-05-05 NOTE — ED Notes (Signed)
Pt report given to IP RN DesiRay Arbutus Ped.

## 2022-05-05 NOTE — Telephone Encounter (Signed)
Aware They saw her and sending to [facility] for [facility] transfusion  I corresponded with the ER doctor

## 2022-05-05 NOTE — Telephone Encounter (Signed)
R/s pt's appt per pt request. Called pt, no answer. Left msg with new appt date/time. Requested for pt to call back to confirm new appt.

## 2022-05-05 NOTE — Telephone Encounter (Signed)
Patient called and stated Dr. Jeanell Sparrow from ER would like a phone call from Dr. Glori Bickers and wanted to know if to give patient a blood transfusion. Call back number [phone].

## 2022-05-06 DIAGNOSIS — D509 Iron deficiency anemia, unspecified: Secondary | ICD-10-CM | POA: Diagnosis not present

## 2022-05-06 DIAGNOSIS — Z5189 Encounter for other specified aftercare: Secondary | ICD-10-CM

## 2022-05-06 DIAGNOSIS — D649 Anemia, unspecified: Secondary | ICD-10-CM | POA: Diagnosis not present

## 2022-05-06 HISTORY — DX: Encounter for other specified aftercare: Z51.89

## 2022-05-06 LAB — BASIC METABOLIC PANEL
Anion gap: 8 (ref 5–15)
BUN: 10 mg/dL (ref 8–23)
CO2: 23 mmol/L (ref 22–32)
Calcium: 8.7 mg/dL — ABNORMAL LOW (ref 8.9–10.3)
Chloride: 108 mmol/L (ref 98–111)
Creatinine, Ser: 0.63 mg/dL (ref 0.44–1.00)
GFR, Estimated: >60 mL/min (ref >60)
Glucose, Bld: 142 mg/dL — ABNORMAL HIGH (ref 70–99)
Potassium: 3.3 mmol/L — ABNORMAL LOW (ref 3.5–5.1)
Sodium: 139 mmol/L (ref 135–145)

## 2022-05-06 LAB — CBC
HCT: 27.4 % — ABNORMAL LOW (ref 36.0–46.0)
Hemoglobin: 8.5 g/dL — ABNORMAL LOW (ref 12.0–15.0)
MCH: 21.9 pg — ABNORMAL LOW (ref 26.0–34.0)
MCHC: 31 g/dL (ref 30.0–36.0)
MCV: 70.6 fL — ABNORMAL LOW (ref 80.0–100.0)
Platelets: 384 10*3/uL (ref 150–400)
RBC: 3.88 MIL/uL (ref 3.87–5.11)
RDW: 18.5 % — ABNORMAL HIGH (ref 11.5–15.5)
WBC: 7 10*3/uL (ref 4.0–10.5)
nRBC: 0 % (ref 0.0–0.2)

## 2022-05-06 LAB — BPAM RBC
Blood Product Expiration Date: 202402182359
ISSUE DATE / TIME: 202402022236
Unit Type and Rh: 600

## 2022-05-06 LAB — RETICULOCYTES
Immature Retic Fract: 32.2 % — ABNORMAL HIGH (ref 2.3–15.9)
RBC.: 3.78 MIL/uL — ABNORMAL LOW (ref 3.87–5.11)
Retic Count, Absolute: 67.7 10*3/uL (ref 19.0–186.0)
Retic Ct Pct: 1.8 % (ref 0.4–3.1)

## 2022-05-06 LAB — TYPE AND SCREEN
ABO/RH(D): A NEG
Antibody Screen: NEGATIVE
Unit division: 0

## 2022-05-06 LAB — IRON AND TIBC
Iron: 10 ug/dL — ABNORMAL LOW (ref 28–170)
Saturation Ratios: 2 % — ABNORMAL LOW (ref 10.4–31.8)
TIBC: 503 ug/dL — ABNORMAL HIGH (ref 250–450)
UIBC: 493 ug/dL

## 2022-05-06 LAB — FERRITIN: Ferritin: 3 ng/mL — ABNORMAL LOW (ref 11–307)

## 2022-05-06 LAB — VITAMIN B12: Vitamin B-12: 283 pg/mL (ref 180–914)

## 2022-05-06 LAB — FOLATE: Folate: 38.2 ng/mL (ref >5.9)

## 2022-05-06 MED ORDER — FERROUS SULFATE 325 (65 FE) MG PO TABS: 325.0000 mg | ORAL_TABLET | Freq: Two times a day (BID) | ORAL | Status: DC

## 2022-05-06 MED ORDER — FOLIC ACID 1 MG PO TABS: 1.0000 mg | ORAL_TABLET | Freq: Every day | ORAL | 0 refills | Status: DC

## 2022-05-06 MED ORDER — CYANOCOBALAMIN 1000 MCG/ML IJ SOLN
1000.0000 ug | Freq: Once | INTRAMUSCULAR | Status: AC
  Administered 2022-05-06: 1000 ug via INTRAMUSCULAR
  Filled 2022-05-06: qty 1

## 2022-05-06 MED ORDER — POTASSIUM CHLORIDE CRYS ER 20 MEQ PO TBCR
40.0000 meq | EXTENDED_RELEASE_TABLET | Freq: Once | ORAL | Status: AC
  Administered 2022-05-06: 40 meq via ORAL
  Filled 2022-05-06: qty 2

## 2022-05-06 MED ORDER — FERROUS GLUCONATE 324 (38 FE) MG PO TABS
648.0000 mg | ORAL_TABLET | Freq: Every day | ORAL | Status: DC
  Administered 2022-05-06: 648 mg via ORAL
  Filled 2022-05-06: qty 2

## 2022-05-06 MED ORDER — VITAMIN B-12 1000 MCG PO TABS: 1000.0000 ug | ORAL_TABLET | Freq: Every day | ORAL | 0 refills | Status: DC

## 2022-05-06 MED ORDER — FOLIC ACID 1 MG PO TABS
1.0000 mg | ORAL_TABLET | Freq: Every day | ORAL | Status: DC
  Administered 2022-05-06: 1 mg via ORAL
  Filled 2022-05-06: qty 1

## 2022-05-06 NOTE — Discharge Instructions (Signed)
Follow with Primary MD Tower, [name], MD in 7 days   Get CBC, CMP, anemia panel including B12, TSH-  checked next visit with your primary MD    Activity: As tolerated with Full fall precautions use walker/cane & assistance as needed  Disposition   Diet: Heart Healthy low carbohydrate diet.  Special Instructions: If you have smoked or chewed Tobacco  in the last 2 yrs please stop smoking, stop any regular Alcohol  and or any Recreational drug use.  On your next visit with your primary care physician please Get Medicines reviewed and adjusted.  Please request your Prim.MD to go over all Hospital Tests and Procedure/Radiological results at the follow up, please get all Hospital records sent to your Prim MD by signing hospital release before you go home.  If you experience worsening of your admission symptoms, develop shortness of breath, life threatening emergency, suicidal or homicidal thoughts you must seek medical attention immediately by calling 911 or calling your MD immediately  if symptoms less severe.  You Must read complete instructions/literature along with all the possible adverse reactions/side effects for all the Medicines you take and that have been prescribed to you. Take any new Medicines after you have completely understood and accpet all the possible adverse reactions/side effects.

## 2022-05-06 NOTE — Discharge Summary (Signed)
Donna Murphy LYY:[id number] DOB: Sep 02, 1945 DOA: 05/05/2022  PCP: Donna Greenspan, MD  Admit date: 05/05/2022  Discharge date: 05/06/2022  Admitted From: Home   Disposition:  Home   Recommendations for Outpatient Follow-up:   Follow up with PCP in 1-2 weeks  PCP Please obtain BMP/CBC, 2 view CXR in 1week,  (see Discharge instructions)   PCP Please follow up on the following pending results: Monitor CBC, anemia panel, B12, TSH and BMP closely.  Close outpatient follow-up with her gastroenterologist for outpatient colonoscopy and EGD which is scheduled for the coming week.   Home Health: None   Equipment/Devices: None  Consultations: None  Discharge Condition: Stable    CODE STATUS: Full    Diet Recommendation: Heart Healthy Low Carb    Chief Complaint  Patient presents with   Shortness of Breath   Dizziness     Brief history of present illness from the day of admission and additional interim summary    77 y.o. female with medical history significant of hypertension, hyperlipidemia, GERD, diabetes, fibroids, iron deficiency anemia presenting with shortness of breath, dizziness, known anemia.  Has been having symptomatic anemia with symptoms of exertional shortness of breath and some lightheadedness upon ambulating, she was diagnosed outpatient with microcytic anemia with a baseline hemoglobin of around 7.5, she is seeing Donna Murphy and is due for outpatient EGD colonoscopy in the next few days.  Since she was getting lightheaded upon walking she was sent to the ER to get a unit of blood transfusions, she was kept under observation for 1 unit of blood transfused.                                                                   Hospital Course   Symptomatic iron deficiency anemia.  Known history of the same, stable  with gradual chronic blood loss related anemia, she has received 1 unit of packed RBC last night with stable posttransfusion H&H, no significant or frank blood loss, brown stools, after transfusion she is symptom-free and wants to be discharged right away and follow-up with her PCP and gastroenterologist, she is due for EGD and colonoscopy in the next few days.  She is already on oral iron supplementation, B12 is borderline and will be placed on oral L27 and folic acid supplementation as well.  She will continue her twice daily PPI and follow-up with PCP and her primary gastroenterologist postdischarge within a week.  Currently she is symptom-free ambulating without any problems whatsoever.   Hypertension.  Continue home regimen of Norvasc.  Dyslipidemia.  Continue statin.  GERD continue home regimen of Pepcid and PPI  DM type II.  Home medications which include metformin, Farxiga and Januvia will be continued unchanged.  Hypokalemia.  Replaced.   Discharge diagnosis  Principal Problem:   Symptomatic anemia Active Problems:   DM (diabetes mellitus), type 2 ([place])   Essential hypertension   GERD   Hyperlipidemia associated with type 2 diabetes mellitus ([place])   Microcytic anemia    Discharge instructions    Discharge Instructions     Discharge instructions   Complete by: As directed    Follow with Primary MD Tower, [name], MD in 7 days   Get CBC, CMP, anemia panel including B12, TSH-  checked next visit with your primary MD    Activity: As tolerated with Full fall precautions use walker/cane & assistance as needed  Disposition   Diet: Heart Healthy low carbohydrate diet.  Special Instructions: If you have smoked or chewed Tobacco  in the last 2 yrs please stop smoking, stop any regular Alcohol  and or any Recreational drug use.  On your next visit with your primary care physician please Get Medicines reviewed and adjusted.  Please request your Prim.MD to go over all  Hospital Tests and Procedure/Radiological results at the follow up, please get all Hospital records sent to your Prim MD by signing hospital release before you go home.  If you experience worsening of your admission symptoms, develop shortness of breath, life threatening emergency, suicidal or homicidal thoughts you must seek medical attention immediately by calling 911 or calling your MD immediately  if symptoms less severe.  You Must read complete instructions/literature along with all the possible adverse reactions/side effects for all the Medicines you take and that have been prescribed to you. Take any new Medicines after you have completely understood and accpet all the possible adverse reactions/side effects.   Increase activity slowly   Complete by: As directed        Discharge Medications   Allergies as of 05/06/2022       Reactions   Ciprofloxacin    Arm numbness   Sulfonamide Derivatives    Hives, rash        Medication List     STOP taking these medications    triamcinolone cream 0.1 % Commonly known as: KENALOG       TAKE these medications    amLODipine 5 MG tablet Commonly known as: NORVASC Take 1 tablet (5 mg total) by mouth daily.   aspirin 81 MG tablet Take 81 mg by mouth daily.   atorvastatin 40 MG tablet Commonly known as: LIPITOR TAKE 1 TABLET BY MOUTH EVERY DAY   cetirizine 10 MG tablet Commonly known as: ZYRTEC Take 10 mg by mouth daily.   cyanocobalamin 1000 MCG tablet Commonly known as: VITAMIN B12 Take 1 tablet (1,000 mcg total) by mouth daily.   famotidine 20 MG tablet Commonly known as: PEPCID TAKE 1 TABLET BY MOUTH EVERYDAY AT BEDTIME What changed: See the new instructions.   Farxiga 10 MG Tabs tablet Generic drug: dapagliflozin propanediol Take 10 mg by mouth daily.   ferrous gluconate 324 MG tablet Commonly known as: FERGON Take 648 mg by mouth daily with breakfast.   folic acid 1 MG tablet Commonly known as:  FOLVITE Take 1 tablet (1 mg total) by mouth daily.   glucose blood test strip Commonly known as: OneTouch Verio Ck blood sugar once daily and as directed. Dx E11.9   Januvia 100 MG tablet Generic drug: sitaGLIPtin Take 100 mg by mouth daily.   Krill Oil 300 MG Caps Take 1 capsule by mouth daily.   metFORMIN 500 MG tablet Commonly known as: GLUCOPHAGE Take 1,000 mg by mouth  2 (two) times daily with a meal.   multivitamin tablet Take 1 tablet by mouth daily.   omeprazole 20 MG capsule Commonly known as: PRILOSEC Take 20 mg by mouth 2 (two) times daily before a meal. What changed: Another medication with the same name was removed. Continue taking this medication, and follow the directions you see here.   psyllium 95 % Pack Commonly known as: HYDROCIL/METAMUCIL Take 1 packet by mouth daily.   Vitamin D 50 MCG (2000 UT) tablet Take 2,000 Units by mouth daily.         Follow-up Information     Tower, [name], MD. Schedule an appointment as soon as possible for a visit in 1 week(s).   Specialties: Family Medicine, Radiology Why: Also follow with your gastroenterologist within a week of discharge Contact information: [address] [address] [phone]                 Major procedures and Radiology Reports - PLEASE review detailed and final reports thoroughly  -       No results found.  Micro Results    No results found for this or any previous visit (from the past 240 hour(s)).  Today   Subjective    Donna Murphy today has no headache,no chest abdominal pain,no new weakness tingling or numbness, feels much better wants to go home today.    Objective   Blood pressure (!) 143/88, pulse 78, temperature 98.7 F (37.1 C), temperature source Oral, resp. rate 16, height 5' 3.5" (1.613 m), weight 58 kg, SpO2 96 %.   Intake/Output Summary (Last 24 hours) at 05/06/2022 0859 Last data filed at 05/06/2022 0800 Gross per 24 hour  Intake  875 ml  Output --  Net 875 ml    Exam  Awake Alert, No new F.N deficits,    Chloride.AT,PERRAL Supple Neck,   Symmetrical Chest wall movement, Good air movement bilaterally, CTAB RRR,No Gallops,   +ve B.Sounds, Abd Soft, Non tender,  No Cyanosis, Clubbing or edema    Data Review   Recent Labs  Lab 05/02/22 1009 05/05/22 1252 05/06/22 0344  WBC 6.6 8.6 7.0  HGB 7.5 Repeated and verified X2.* 7.1* 8.5*  HCT 24.0* 24.3* 27.4*  PLT 465.0* 478* 384  MCV 66.7 Repeated and verified X2.* 69.4* 70.6*  MCH  --  20.3* 21.9*  MCHC 31.2 29.2* 31.0  RDW 18.2* 17.3* 18.5*  LYMPHSABS 1.4  --   --   MONOABS 0.4  --   --   EOSABS 0.1  --   --   BASOSABS 0.1  --   --     Recent Labs  Lab 05/05/22 1252 05/06/22 0344  NA 142 139  K 3.8 3.3*  CL 107 108  CO2 22 23  ANIONGAP 13 8  GLUCOSE 159* 142*  BUN 14 10  CREATININE 0.61 0.63  AST 41  --   ALT 61*  --   ALKPHOS 52  --   BILITOT 0.3  --   ALBUMIN 4.5  --   INR 0.9  --   CALCIUM 9.4 8.7*     Total Time in preparing paper work, data evaluation and todays exam - 35 minutes  Signature  -    Lala Lund M.D on 05/06/2022 at 8:59 AM   -  To page go to [email or web address]

## 2022-05-06 NOTE — Progress Notes (Signed)
PT Cancellation Note  Patient Details Name: Donna Murphy MRN: [id number] DOB: 11-Oct-1945   Cancelled Treatment:    Reason Eval/Treat Not Completed: PT screened, no needs identified, will sign off. Per conversation with OT, pt back to her baseline with no concerns with mobility upon discharge. Pt with no current acute PT needs or needs upon discharge. PT will sign off, thank you.    Luvenia Heller 05/06/2022, 10:08 AM

## 2022-05-06 NOTE — Evaluation (Signed)
Occupational Therapy Evaluation and Discharge Patient Details Name: Donna Murphy MRN: [id number] DOB: 12-05-1945 Today's Date: 05/06/2022   History of Present Illness Donna Murphy is a 77 y.o. female presenting with shortness of breath, dizziness, known anemia. Found to have symptomatic anemia of 7.1 (down from 8.0 2 weeks ago). PHMx: hypertension, hyperlipidemia, GERD, diabetes, fibroids, iron deficiency anemia   Clinical Impression   This 77 yo female admitted with above presents to acute OT at an independent level for basic ADLs and based on how she moved about her room do not foresee her having any issues with IADLs. I did recommend that she use a reacher or squat to get items lower down instead of bending over until they figure out what is going on with causing the dizziness. No further OT needs, and no PT needs identified (have made them aware). Acute OT will sign off.      Recommendations for follow up therapy are one component of a multi-disciplinary discharge planning process, led by the attending physician.  Recommendations may be updated based on patient status, additional functional criteria and insurance authorization.   Follow Up Recommendations  No OT follow up     Assistance Recommended at Discharge None     Functional Status Assessment  Patient has not had a recent decline in their functional status  Equipment Recommendations  None recommended by OT       Precautions / Restrictions Precautions Precautions: Other (comment) Precaution Comments: intermittent dizziness from anemia--no falls Restrictions Weight Bearing Restrictions: No      Mobility Bed Mobility Overal bed mobility: Independent                  Transfers Overall transfer level: Independent                        Balance Overall balance assessment: Independent (for everyday activities, for higher level balance (tandem and single leg stance she does have balance issues) reports bad  knees.)                                         ADL either performed or assessed with clinical judgement   ADL Overall ADL's : Independent                                       General ADL Comments: Able to gather clothes and get dressed; as well move around her room without issues.     Vision Baseline Vision/History: 1 Wears glasses Ability to See in Adequate Light: 0 Adequate Patient Visual Report: No change from baseline              Pertinent Vitals/Pain Pain Assessment Pain Assessment: No/denies pain     Hand Dominance Right   Extremity/Trunk Assessment Upper Extremity Assessment Upper Extremity Assessment: Overall WFL for tasks assessed           Communication Communication Communication: No difficulties   Cognition Arousal/Alertness: Awake/alert Behavior During Therapy: WFL for tasks assessed/performed Overall Cognitive Status: Within Functional Limits for tasks assessed                                       General  Comments  Pt is very active with walking 3 miles a day (hills and flat), doing her own errands, doing her own IADLs, driving to see her grandchildren play sports. She does not report today that she feels any change from baseline (actually feels a little better)    Exercises Other Exercises Other Exercises: Pt asked about some exercises that could strengthen her legs and balance (demonstrated to her toe rises, squats, and one legged stance standing at the sink) and tandem walking with something beside her to hold onto if needed. She also talked about getting a stationary bike that is easily portable. She says she has hand weights at home, I informed her that she could get wrap weights that she could put at her ankles and then do exercises with them for knee flexion/extension, hip flexion/extension, and hip adduction/abduction--demonstrated to patient.        Home Living Family/patient expects to  be discharged to:: Private residence Living Arrangements: Alone Available Help at Discharge: Family;Available 24 hours/day Type of Home: House Home Access: Stairs to enter [employer] of Steps: 4 Entrance Stairs-Rails: Right;Left;Can reach both Home Layout: Able to live on main level with bedroom/bathroom;Two level     Bathroom Shower/Tub: Occupational psychologist: Handicapped height     Home Equipment: None          Prior Functioning/Environment Prior Level of Function : Independent/Modified Independent;Driving                        OT Problem List: Impaired balance (sitting and/or standing) (for high level balance only)         OT Goals(Current goals can be found in the care plan section) Acute Rehab OT Goals Patient Stated Goal: to go home today         AM-PAC OT "6 Clicks" Daily Activity     Outcome Measure Help from another person eating meals?: None Help from another person taking care of personal grooming?: None Help from another person toileting, which includes using toliet, bedpan, or urinal?: None Help from another person bathing (including washing, rinsing, drying)?: None Help from another person to put on and taking off regular upper body clothing?: None Help from another person to put on and taking off regular lower body clothing?: None 6 Click Score: 24   End of Session Equipment Utilized During Treatment:  (none) Nurse Communication:  (pt good to go from therapy standpoint (via chat text))  Activity Tolerance: Patient tolerated treatment well Patient left:  (sitting EOB talking to her dtr from [place] who is in town)  OT Visit Diagnosis: Unsteadiness on feet (R26.81) (for high balance activities only)                Time: 0355-9741 OT Time Calculation (min): 31 min Charges:  OT General Charges $OT Visit: 1 Visit OT Evaluation $OT Eval Moderate Complexity: 1 Mod OT Treatments $Self Care/Home Management : 8-22  mins  [place] Office [phone]    Almon Register 05/06/2022, 9:57 AM

## 2022-05-10 ENCOUNTER — Encounter: Payer: Medicare Other | Admitting: Hematology and Oncology

## 2022-05-10 ENCOUNTER — Encounter: Payer: Self-pay | Admitting: Internal Medicine

## 2022-05-10 ENCOUNTER — Ambulatory Visit (AMBULATORY_SURGERY_CENTER): Payer: Medicare Other | Admitting: Internal Medicine

## 2022-05-10 VITALS — BP 134/76 | HR 67 | Temp 96.9°F | Resp 16 | Ht 63.0 in | Wt 125.0 lb

## 2022-05-10 DIAGNOSIS — K6389 Other specified diseases of intestine: Secondary | ICD-10-CM | POA: Diagnosis not present

## 2022-05-10 DIAGNOSIS — C182 Malignant neoplasm of ascending colon: Secondary | ICD-10-CM

## 2022-05-10 DIAGNOSIS — K219 Gastro-esophageal reflux disease without esophagitis: Secondary | ICD-10-CM

## 2022-05-10 DIAGNOSIS — D509 Iron deficiency anemia, unspecified: Secondary | ICD-10-CM

## 2022-05-10 DIAGNOSIS — K589 Irritable bowel syndrome without diarrhea: Secondary | ICD-10-CM | POA: Diagnosis not present

## 2022-05-10 DIAGNOSIS — Z09 Encounter for follow-up examination after completed treatment for conditions other than malignant neoplasm: Secondary | ICD-10-CM | POA: Diagnosis not present

## 2022-05-10 DIAGNOSIS — Z86010 Personal history of colonic polyps: Secondary | ICD-10-CM

## 2022-05-10 HISTORY — PX: COLONOSCOPY WITH ESOPHAGOGASTRODUODENOSCOPY (EGD): SHX5779

## 2022-05-10 MED ORDER — SODIUM CHLORIDE 0.9 % IV SOLN: 500.0000 mL | Freq: Once | INTRAVENOUS | Status: DC

## 2022-05-10 NOTE — Op Note (Signed)
[facility] Patient Name: Donna Murphy Procedure Date: 05/10/2022 3:39 PM MRN: [id number] Endoscopist: Gatha Mayer , MD, [id number] Age: 77 Referring MD:  Date of Birth: 10/29/1945 Gender: Female Account #: [id number] Procedure:                Upper GI endoscopy Indications:              Iron deficiency anemia Medicines:                Monitored Anesthesia Care Procedure:                Pre-Anesthesia Assessment:                           - Prior to the procedure, a History and Physical                            was performed, and patient medications and                            allergies were reviewed. The patient's tolerance of                            previous anesthesia was also reviewed. The risks                            and benefits of the procedure and the sedation                            options and risks were discussed with the patient.                            All questions were answered, and informed consent                            was obtained. Prior Anticoagulants: The patient has                            taken no anticoagulant or antiplatelet agents. ASA                            Grade Assessment: III - A patient with severe                            systemic disease. After reviewing the risks and                            benefits, the patient was deemed in satisfactory                            condition to undergo the procedure.                           After obtaining informed consent, the endoscope was  passed under direct vision. Throughout the                            procedure, the patient's blood pressure, pulse, and                            oxygen saturations were monitored continuously. The                            GIF [id number] was introduced through the                            mouth, and advanced to the second part of duodenum.                            The upper GI endoscopy was  accomplished without                            difficulty. The patient tolerated the procedure                            well. Scope In: Scope Out: Findings:                 The esophagus was normal.                           The stomach was normal.                           The examined duodenum was normal.                           The cardia and gastric fundus were normal on                            retroflexion. Complications:            No immediate complications. Estimated Blood Loss:     Estimated blood loss: none. Impression:               - Normal esophagus.                           - Normal stomach.                           - Normal examined duodenum.                           - No specimens collected. Recommendation:           - Patient has a contact number available for                            emergencies. The signs and symptoms of potential  delayed complications were discussed with the                            patient. Return to normal activities tomorrow.                            Written discharge instructions were provided to the                            patient.                           - Resume previous diet.                           - Continue present medications.                           - See the other procedure note for documentation of                            additional recommendations. Colonoscopy next Gatha Mayer, MD 05/10/2022 4:12:33 PM This report has been signed electronically.

## 2022-05-10 NOTE — Progress Notes (Unsigned)
Pt's states no medical or surgical changes since previsit or office visit except transfusion on 05/06/22.

## 2022-05-10 NOTE — Patient Instructions (Addendum)
There is a lesion in the colon that is the source of blood loss and low iron. It may be a cancer, probably is. Biopsies were taken and I hope to have an answer by Friday. Once I get those results we will plan the next steps.  If it is a cancer then will need additional blood tests and CT scans, + referral to surgeon and cancer specilist.  I will call with biopsy results when they are in.  I appreciate the opportunity to care for you. Gatha Mayer, MD, FACG  YOU HAD AN ENDOSCOPIC PROCEDURE TODAY AT [facility]:   Refer to the procedure report that was given to you for any specific questions about what was found during the examination.  If the procedure report does not answer your questions, please call your gastroenterologist to clarify.  If you requested that your care partner not be given the details of your procedure findings, then the procedure report has been included in a sealed envelope for you to review at your convenience later.  YOU SHOULD EXPECT: Some feelings of bloating in the abdomen. Passage of more gas than usual.  Walking can help get rid of the air that was put into your GI tract during the procedure and reduce the bloating. If you had a lower endoscopy (such as a colonoscopy or flexible sigmoidoscopy) you may notice spotting of blood in your stool or on the toilet paper. If you underwent a bowel prep for your procedure, you may not have a normal bowel movement for a few days.  Please Note:  You might notice some irritation and congestion in your nose or some drainage.  This is from the oxygen used during your procedure.  There is no need for concern and it should clear up in a day or so.  SYMPTOMS TO REPORT IMMEDIATELY:  Following lower endoscopy (colonoscopy or flexible sigmoidoscopy):  Excessive amounts of blood in the stool  Significant tenderness or worsening of abdominal pains  Swelling of the abdomen that is new, acute  Fever of 100F or  higher  Following upper endoscopy (EGD)  Vomiting of blood or coffee ground material  New chest pain or pain under the shoulder blades  Painful or persistently difficult swallowing  New shortness of breath  Fever of 100F or higher  Black, tarry-looking stools  For urgent or emergent issues, a gastroenterologist can be reached at any hour by calling [phone]. Do not use MyChart messaging for urgent concerns.    DIET:  We do recommend a small meal at first, but then you may proceed to your regular diet.  Drink plenty of fluids but you should avoid alcoholic beverages for 24 hours.  ACTIVITY:  You should plan to take it easy for the rest of today and you should NOT DRIVE or use heavy machinery until tomorrow (because of the sedation medicines used during the test).    FOLLOW UP: Our staff will call the number listed on your records the next business day following your procedure.  We will call around 7:15- 8:00 am to check on you and address any questions or concerns that you may have regarding the information given to you following your procedure. If we do not reach you, we will leave a message.     If any biopsies were taken you will be contacted by phone or by letter within the next 1-3 weeks.  Please call us at [phone] if you have not heard about the  biopsies in 3 weeks.    SIGNATURES/CONFIDENTIALITY: You and/or your care partner have signed paperwork which will be entered into your electronic medical record.  These signatures attest to the fact that that the information above on your After Visit Summary has been reviewed and is understood.  Full responsibility of the confidentiality of this discharge information lies with you and/or your care-partner.

## 2022-05-10 NOTE — Progress Notes (Unsigned)
History and Physical Interval Note:  05/10/2022 3:36 PM  Donna Murphy  has presented today for endoscopic procedure(s), with the diagnosis of  Encounter Diagnoses  Name Primary?   Iron deficiency anemia, unspecified iron deficiency anemia type Yes   Irritable bowel syndrome, unspecified type    Gastroesophageal reflux disease, unspecified whether esophagitis present    Hx of colonic polyps   .  The various methods of evaluation and treatment have been discussed with the patient and/or family. After consideration of risks, benefits and other options for treatment, the patient has consented to  the endoscopic procedure(s).   The patient's history has been reviewed, patient examined, no change in status, stable for endoscopic procedure(s).  I have reviewed the patient's chart and labs.  Questions were answered to the patient's satisfaction.     Gatha Mayer, MD, [facility]

## 2022-05-10 NOTE — Progress Notes (Unsigned)
Report to pacu rn. Vss. Care resumed by rn. 

## 2022-05-10 NOTE — Progress Notes (Signed)
Called to room to assist during endoscopic procedure.  Patient ID and intended procedure confirmed with present staff. Received instructions for my participation in the procedure from the performing physician.  

## 2022-05-10 NOTE — Op Note (Signed)
[facility] Patient Name: Verl Whitmore Procedure Date: 05/10/2022 3:38 PM MRN: [id number] Endoscopist: Gatha Mayer , MD, [id number] Age: 77 Referring MD:  Date of Birth: 1945/11/04 Gender: Female Account #: [id number] Procedure:                Colonoscopy Indications:              Iron deficiency anemia Medicines:                Monitored Anesthesia Care Procedure:                Pre-Anesthesia Assessment:                           - Prior to the procedure, a History and Physical                            was performed, and patient medications and                            allergies were reviewed. The patient's tolerance of                            previous anesthesia was also reviewed. The risks                            and benefits of the procedure and the sedation                            options and risks were discussed with the patient.                            All questions were answered, and informed consent                            was obtained. Prior Anticoagulants: The patient has                            taken no anticoagulant or antiplatelet agents. ASA                            Grade Assessment: III - A patient with severe                            systemic disease. After reviewing the risks and                            benefits, the patient was deemed in satisfactory                            condition to undergo the procedure.                           After obtaining informed consent, the colonoscope  was passed under direct vision. Throughout the                            procedure, the patient's blood pressure, pulse, and                            oxygen saturations were monitored continuously. The                            CF [id number] #[id number] was introduced through the anus                            and advanced to the the cecum, identified by                            appendiceal orifice and ileocecal  valve. The                            colonoscopy was performed without difficulty. The                            patient tolerated the procedure well. The quality                            of the bowel preparation was good. The ileocecal                            valve, appendiceal orifice, and rectum were                            photographed. Scope In: 3:50:58 PM Scope Out: 4:04:41 PM Scope Withdrawal Time: 0 hours 10 minutes 14 seconds  Total Procedure Duration: 0 hours 13 minutes 43 seconds  Findings:                 The perianal and digital rectal examinations were                            normal.                           An ulcerated small mass was found in the ascending                            colon. The mass was non-circumferential. In                            addition, its diameter measured 20-25 mm. No                            bleeding was present. Biopsies were taken with a                            cold forceps for histology. Verification of patient  identification for the specimen was done. Estimated                            blood loss was minimal. Area was tattooed with an                            injection of 3 mL of Spot (carbon black).                           Multiple diverticula were found in the sigmoid                            colon.                           Internal hemorrhoids were found.                           The exam was otherwise without abnormality on                            direct and retroflexion views. Complications:            No immediate complications. Estimated Blood Loss:     Estimated blood loss was minimal. Impression:               - Rule out malignancy, tumor in the ascending                            colon. Biopsied. Tattooed.                           - Diverticulosis in the sigmoid colon.                           - The examination was otherwise normal on direct                             and retroflexion views.                           - Personal history of colonic polyps. 02/2016 -                            ssp/a w/ focus high grade dysplasia and tubular                            adenoma - both in ascending colon                           02/21/2018 no polyps Recommendation:           - Patient has a contact number available for                            emergencies. The signs and symptoms of potential  delayed complications were discussed with the                            patient. Return to normal activities tomorrow.                            Written discharge instructions were provided to the                            patient.                           - Resume previous diet.                           - Continue present medications.                           - Await pathology results.                           - Repeat colonoscopy is recommended. The                            colonoscopy date will be determined after pathology                            results from today's exam become available for                            review. Gatha Mayer, MD 05/10/2022 4:19:54 PM This report has been signed electronically.

## 2022-05-11 ENCOUNTER — Telehealth: Payer: Self-pay

## 2022-05-11 ENCOUNTER — Telehealth: Payer: Self-pay | Admitting: Internal Medicine

## 2022-05-11 DIAGNOSIS — C182 Malignant neoplasm of ascending colon: Secondary | ICD-10-CM

## 2022-05-11 NOTE — Telephone Encounter (Signed)
I called patient after receiving notification from pathology that the biopsies showed adenocarcinoma of the ascending colon.   She needs the following:  1) Schedule CT of chest abdomen pelvis with contrast, rule out metastases in ascending colon cancer.  She has a recent BMET so no labs needed.   2) She needs to be referred to Dr. Burr Medico or Dr. Benay Spice of oncology.  She currently has an appointment with Dr. Alvy Bimler for 05/15/2022 regarding her iron deficiency anemia and we should have them cancel that and make an appointment with one of the other 2 doctors above.  3) refer to Dr. Marcello Moores or Dr. Dema Severin of [facility] surgery (colorectal surgeons) regarding the colon cancer   She is aware that we will be working on this and giving her updates.  I have copied Dr. Glori Bickers her PCP as an Juluis Rainier

## 2022-05-11 NOTE — Telephone Encounter (Signed)
Left message on follow up call. 

## 2022-05-12 ENCOUNTER — Other Ambulatory Visit: Payer: Self-pay

## 2022-05-12 ENCOUNTER — Encounter: Payer: Self-pay | Admitting: *Deleted

## 2022-05-12 DIAGNOSIS — C182 Malignant neoplasm of ascending colon: Secondary | ICD-10-CM

## 2022-05-12 NOTE — Progress Notes (Signed)
PATIENT NAVIGATOR PROGRESS NOTE  Name: Donna Murphy Date: 05/12/2022 MRN: [id number]  DOB: 01-Oct-1945   Reason for visit:  Introductory phone call  Comments:  Called and spoke with pt and have scheduled her with Dr Benay Spice on 05/16/22 at 1:40pm Reviewed directions to building and parking as well as one support person allowed in the appointment.   Also have requested MMR testing from accession number [id number] as well    Time spent counseling/coordinating care: 30-45 minutes

## 2022-05-12 NOTE — Telephone Encounter (Signed)
Pt scheduled for CT of Chest abdomen pelvis with contrast, rule out metastases in ascending colon cancer on 05/23/2022 at [facility] at 5:00 PM. Pt to arrive at 2:30 PM. Start drinking contrast at 3:00 PM. NPO at 1:00 PM.   Spoke with Lockie Pares RN at [facility] and was informed that she will cancel the appt with Dr. Alvy Bimler for 05/15/2022 and place the referral to Dr. Benay Spice office and they will contact pt and schedule an office visit.  Referral  to Dr. Marcello Moores or Dr. Dema Severin of [facility] surgery (colorectal surgeons) regarding the colon cancer faxed along with pt records.   Left message for pt to call back

## 2022-05-12 NOTE — Telephone Encounter (Signed)
Pt made aware aware of Dr. Carlean Purl recommendations: Pt scheduled for CT of Chest abdomen pelvis with contrast, rule out metastases in ascending colon cancer on 05/23/2022 at [facility] at 5:00 PM. Pt to arrive at 2:30 PM. Start drinking contrast at 3:00 PM. NPO at 1:00 PM.  Pt made aware.    Spoke with Lockie Pares RN at [facility] and was informed that she will cancel the appt with Dr. Alvy Bimler for 05/15/2022 and place the referral to Dr. Benay Spice office and they will contact pt and schedule an office visit. Pt stated that Dr. Benay Spice office has already contacted pt and pt was scheduled for 05/16/2022 at 1:40:    Referral  to Dr. Marcello Moores or Dr. Dema Severin of [facility] surgery (colorectal surgeons) regarding the colon cancer faxed along with pt records. Pt made aware. Pt notified that if she has not heard anything from there office in 1 week to give our office a call so that we can check on the status of the referral.   Pt verbalized understanding with all questions answered.

## 2022-05-15 ENCOUNTER — Inpatient Hospital Stay: Payer: Medicare Other | Admitting: Hematology and Oncology

## 2022-05-16 ENCOUNTER — Encounter: Payer: Self-pay | Admitting: *Deleted

## 2022-05-16 ENCOUNTER — Inpatient Hospital Stay: Payer: Medicare Other

## 2022-05-16 ENCOUNTER — Inpatient Hospital Stay: Payer: Medicare Other | Attending: Oncology | Admitting: Oncology

## 2022-05-16 VITALS — BP 170/68 | HR 88 | Temp 98.1°F | Resp 18 | Ht 63.0 in | Wt 124.6 lb

## 2022-05-16 DIAGNOSIS — C182 Malignant neoplasm of ascending colon: Secondary | ICD-10-CM | POA: Diagnosis not present

## 2022-05-16 DIAGNOSIS — E119 Type 2 diabetes mellitus without complications: Secondary | ICD-10-CM | POA: Diagnosis not present

## 2022-05-16 DIAGNOSIS — I10 Essential (primary) hypertension: Secondary | ICD-10-CM | POA: Insufficient documentation

## 2022-05-16 LAB — CMP (CANCER CENTER ONLY)
ALT: 44 U/L (ref 0–44)
AST: 25 U/L (ref 15–41)
Albumin: 4.5 g/dL (ref 3.5–5.0)
Alkaline Phosphatase: 51 U/L (ref 38–126)
Anion gap: 12 (ref 5–15)
BUN: 13 mg/dL (ref 8–23)
CO2: 24 mmol/L (ref 22–32)
Calcium: 9.9 mg/dL (ref 8.9–10.3)
Chloride: 105 mmol/L (ref 98–111)
Creatinine: 0.65 mg/dL (ref 0.44–1.00)
GFR, Estimated: >60 mL/min (ref >60)
Glucose, Bld: 144 mg/dL — ABNORMAL HIGH (ref 70–99)
Potassium: 3.9 mmol/L (ref 3.5–5.1)
Sodium: 141 mmol/L (ref 135–145)
Total Bilirubin: 0.3 mg/dL (ref 0.3–1.2)
Total Protein: 7.3 g/dL (ref 6.5–8.1)

## 2022-05-16 LAB — CBC WITH DIFFERENTIAL (CANCER CENTER ONLY)
Abs Immature Granulocytes: 0.02 10*3/uL (ref 0.00–0.07)
Basophils Absolute: 0 10*3/uL (ref 0.0–0.1)
Basophils Relative: 0 %
Eosinophils Absolute: 0.1 10*3/uL (ref 0.0–0.5)
Eosinophils Relative: 1 %
HCT: 32.1 % — ABNORMAL LOW (ref 36.0–46.0)
Hemoglobin: 9.3 g/dL — ABNORMAL LOW (ref 12.0–15.0)
Immature Granulocytes: 0 %
Lymphocytes Relative: 22 %
Lymphs Abs: 1.7 10*3/uL (ref 0.7–4.0)
MCH: 20.9 pg — ABNORMAL LOW (ref 26.0–34.0)
MCHC: 29 g/dL — ABNORMAL LOW (ref 30.0–36.0)
MCV: 72.3 fL — ABNORMAL LOW (ref 80.0–100.0)
Monocytes Absolute: 0.4 10*3/uL (ref 0.1–1.0)
Monocytes Relative: 5 %
Neutro Abs: 5.5 10*3/uL (ref 1.7–7.7)
Neutrophils Relative %: 72 %
Platelet Count: 531 10*3/uL — ABNORMAL HIGH (ref 150–400)
RBC: 4.44 MIL/uL (ref 3.87–5.11)
RDW: 19.9 % — ABNORMAL HIGH (ref 11.5–15.5)
WBC Count: 7.7 10*3/uL (ref 4.0–10.5)
nRBC: 0 % (ref 0.0–0.2)

## 2022-05-16 LAB — SAMPLE TO BLOOD BANK

## 2022-05-16 LAB — CEA (ACCESS): CEA (CHCC): 1.89 ng/mL (ref 0.00–5.00)

## 2022-05-16 NOTE — Progress Notes (Signed)
PATIENT NAVIGATOR PROGRESS NOTE  Name: Donna Murphy Date: 05/16/2022 MRN: [id number]  DOB: 18-Jan-1946   Reason for visit:  Initial visit with Dr Benay Spice  Comments:  Met with [name] and daughter during visit with Dr Benay Spice Scheduled for CT scans on 2/20, will follow Labs drawn today will continue to follow Awaiting surgical consult with Dr Dema Severin Given contact information to call with any issues or questions    Time spent counseling/coordinating care: > 60 minutes

## 2022-05-16 NOTE — Progress Notes (Signed)
Donna Murphy Consult   Requesting MD: Abner Greenspan, [facility] [place],  [place] [address]   Donna Murphy 77 y.o.  Mar 14, 1946    Reason for Consult: Colon cancer   HPI: Donna Murphy reports developing "dizziness "when bending over to pick up objects in August of last year.  She felt this may have been related to a change in her allergy medications.  She developed exertional dyspnea beginning in December 2023.  She saw her endocrinologist.  A CBC confirmed anemia.  She was referred to Dr. Glori Bickers a CBC on 05/02/2022 found the hemoglobin at 7.5, MCV 66.7, platelets 465,000, WBC 6.6, and absolute neutrophil count 4.6.  The ferritin returned at 2. She had increased exertional dyspnea.  She was admitted and received a Red cell transfusion on 05/05/2022.  She began iron therapy.  She was referred to Dr. Arelia Longest and was taken to a colonoscopy on 05/10/2022.  An ulcerated mass was found in the ascending colon.  No bleeding was present.  Biopsies were obtained.  The area was tattooed.  The pathology revealed invasive moderately differentiated adenocarcinoma.  She had a previous colonoscopy in 02/17/16.  Polyps were removed from the ascending colon with the pathology revealing a sessile serrated polyp with a focus of high-grade dysplasia and a tubular adenoma.  A colonoscopy in Feb 16, 2018 revealed no polyps.  Past Medical History:  Diagnosis Date   Allergic rhinitis    Allergy    Blood transfusion without reported diagnosis 05/06/2022   DMII (diabetes mellitus, type 2) (HCC)    Fibroids    uterine   GERD (gastroesophageal reflux disease)    HLD (hyperlipidemia)    HTN (hypertension)    Hx of colonic polyps 03/01/2016   Irritable bowel syndrome    Kidney stone    passed stone-no surgery required   Skin cancer of lip    basal cell   Varices of other sites     .  G4 P3, 1 miscarriage   .  Glaucoma  Past Surgical History:  Procedure Laterality Date    COLONOSCOPY  12/2005   negative - gessner   COLONOSCOPY WITH ESOPHAGOGASTRODUODENOSCOPY (EGD)  05/10/2022   ENDOMETRIAL BIOPSY  01/2000   negative   TONSILLECTOMY     TOOTH EXTRACTION      Medications: Reviewed  Allergies:  Allergies  Allergen Reactions   Ciprofloxacin     Arm numbness   Sulfonamide Derivatives     Hives, rash   Family history: Her paternal grandmother had a gynecologic malignancy, she believes it may have been uterine cancer  Social History:   She lives alone in [place].  Her husband died in Feb 16, 2021.  She is retired from a window treatment business.  She does not use cigarettes.  She has 1 glass of wine per night.  No risk factor for hepatitis.  ROS:   Positives include: Exertional dyspnea, intermittent rash at the chest and arms, 1 episode of rectal bleeding in August 2023  A complete ROS was otherwise negative.  Physical Exam:  Blood pressure (!) 170/68, pulse 88, temperature 98.1 F (36.7 C), temperature source Oral, resp. rate 18, height 5' 3"$  (1.6 m), weight 124 lb 9.6 oz (56.5 kg), SpO2 100 %.  HEENT: Oropharynx without visible mass, neck without mass Lungs: Clear bilaterally Cardiac: Regular rate and rhythm Abdomen: No hepatosplenomegaly, no mass, nontender  Vascular: No leg edema Lymph nodes: No cervical, supraclavicular, axillary, or inguinal  nodes Neurologic: Alert and oriented, the motor exam is intact in the upper and lower extremities bilaterally Skin: No rash Musculoskeletal: No spine tenderness   LAB:  CBC  Lab Results  Component Value Date   WBC 7.7 05/16/2022   HGB 9.3 (L) 05/16/2022   HCT 32.1 (L) 05/16/2022   MCV 72.3 (L) 05/16/2022   PLT 531 (H) 05/16/2022   NEUTROABS 5.5 05/16/2022        CMP  Lab Results  Component Value Date   NA 141 05/16/2022   K 3.9 05/16/2022   CL 105 05/16/2022   CO2 24 05/16/2022   GLUCOSE 144 (H) 05/16/2022   BUN 13 05/16/2022   CREATININE 0.65 05/16/2022   CALCIUM 9.9  05/16/2022   PROT 7.3 05/16/2022   ALBUMIN 4.5 05/16/2022   AST 25 05/16/2022   ALT 44 05/16/2022   ALKPHOS 51 05/16/2022   BILITOT 0.3 05/16/2022   GFRNONAA >60 05/16/2022   GFRAA 131 02/13/2008    CEA-1.89     Assessment/Plan:   Ascending colon cancer Colonoscopy 05/10/2022-small ulcerated mass in the ascending colon, invasive moderately differentiated adenocarcinoma Normal CEA History of colon polyps-sessile serrated polyp and tubular adenoma in the ascending colon member 2017 Colonoscopy November 2019-no polyps 3.  Type 2 diabetes 4.  Gastroesophageal reflux disease 5.   Seasonal allergies 6.   Diarrhea illness, colitis?  As a child 7.   IBS 8.   Hypertension   Disposition:   DonnaMurphy has been diagnosed with adenocarcinoma of the ascending colon.  There is no clinical evidence of distant metastatic disease.  She is scheduled for staging CTs next week.  She has been referred to Dr. Dema Severin.  I discussed the treatment of colon cancer with DonnaMurphy.  She understands standard treatment is surgical resection if the staging CTs reveal no evidence of metastatic disease.  I will see her following surgery to review the surgical pathology and decide on the indication for adjuvant therapy.  We will follow-up on mismatch repair protein testing from the colonoscopy biopsy.  She developed a cancer and a short interval from the last colonoscopy suggesting the possibility of an inherited colon cancer syndrome.  Her family members are at increased risk of developing colorectal cancer and should receive appropriate screening.  She will continue oral iron therapy.  The hemoglobin is improved today.    Betsy Coder, MD  05/16/2022, 4:30 PM

## 2022-05-17 ENCOUNTER — Encounter: Payer: Self-pay | Admitting: *Deleted

## 2022-05-19 ENCOUNTER — Encounter: Payer: Self-pay | Admitting: *Deleted

## 2022-05-22 ENCOUNTER — Other Ambulatory Visit: Payer: Self-pay | Admitting: *Deleted

## 2022-05-22 ENCOUNTER — Inpatient Hospital Stay: Payer: Medicare Other

## 2022-05-22 DIAGNOSIS — C182 Malignant neoplasm of ascending colon: Secondary | ICD-10-CM

## 2022-05-23 ENCOUNTER — Ambulatory Visit (HOSPITAL_COMMUNITY)
Admission: RE | Admit: 2022-05-23 | Discharge: 2022-05-23 | Disposition: A | Discharge status: Home / Self Care | Payer: Medicare Other | Source: Ambulatory Visit | Attending: Internal Medicine | Admitting: Internal Medicine

## 2022-05-23 DIAGNOSIS — I700 Atherosclerosis of aorta: Secondary | ICD-10-CM | POA: Diagnosis not present

## 2022-05-23 DIAGNOSIS — N289 Disorder of kidney and ureter, unspecified: Secondary | ICD-10-CM | POA: Diagnosis not present

## 2022-05-23 DIAGNOSIS — Z85038 Personal history of other malignant neoplasm of large intestine: Secondary | ICD-10-CM | POA: Diagnosis not present

## 2022-05-23 DIAGNOSIS — I2510 Atherosclerotic heart disease of native coronary artery without angina pectoris: Secondary | ICD-10-CM | POA: Diagnosis not present

## 2022-05-23 DIAGNOSIS — C182 Malignant neoplasm of ascending colon: Secondary | ICD-10-CM | POA: Insufficient documentation

## 2022-05-23 DIAGNOSIS — K769 Liver disease, unspecified: Secondary | ICD-10-CM | POA: Diagnosis not present

## 2022-05-23 DIAGNOSIS — K5730 Diverticulosis of large intestine without perforation or abscess without bleeding: Secondary | ICD-10-CM | POA: Diagnosis not present

## 2022-05-23 MED ORDER — SODIUM CHLORIDE (PF) 0.9 % IJ SOLN
INTRAMUSCULAR | Status: AC
  Filled 2022-05-23: qty 50

## 2022-05-23 MED ORDER — IOHEXOL 9 MG/ML PO SOLN
500.0000 mL | ORAL | Status: AC
  Administered 2022-05-23 (×2): 500 mL via ORAL

## 2022-05-23 MED ORDER — IOHEXOL 9 MG/ML PO SOLN
ORAL | Status: AC
  Filled 2022-05-23: qty 1000

## 2022-05-23 MED ORDER — IOHEXOL 300 MG/ML  SOLN
80.0000 mL | Freq: Once | INTRAMUSCULAR | Status: AC | PRN
  Administered 2022-05-23: 80 mL via INTRAVENOUS

## 2022-05-24 DIAGNOSIS — C182 Malignant neoplasm of ascending colon: Secondary | ICD-10-CM | POA: Diagnosis not present

## 2022-05-24 DIAGNOSIS — N2889 Other specified disorders of kidney and ureter: Secondary | ICD-10-CM | POA: Diagnosis not present

## 2022-05-25 ENCOUNTER — Other Ambulatory Visit: Payer: Self-pay | Admitting: Surgery

## 2022-05-25 ENCOUNTER — Telehealth: Payer: Self-pay | Admitting: Family Medicine

## 2022-05-25 ENCOUNTER — Other Ambulatory Visit: Payer: Self-pay | Admitting: Family Medicine

## 2022-05-25 DIAGNOSIS — N2889 Other specified disorders of kidney and ureter: Secondary | ICD-10-CM

## 2022-05-25 DIAGNOSIS — C182 Malignant neoplasm of ascending colon: Secondary | ICD-10-CM

## 2022-05-25 NOTE — Telephone Encounter (Signed)
Error

## 2022-05-31 ENCOUNTER — Ambulatory Visit (INDEPENDENT_AMBULATORY_CARE_PROVIDER_SITE_OTHER): Payer: Medicare Other | Admitting: Family Medicine

## 2022-05-31 ENCOUNTER — Encounter: Payer: Self-pay | Admitting: Family Medicine

## 2022-05-31 VITALS — BP 136/80 | HR 88 | Temp 97.4°F | Ht 63.0 in | Wt 120.5 lb

## 2022-05-31 DIAGNOSIS — E538 Deficiency of other specified B group vitamins: Secondary | ICD-10-CM | POA: Diagnosis not present

## 2022-05-31 DIAGNOSIS — E119 Type 2 diabetes mellitus without complications: Secondary | ICD-10-CM | POA: Diagnosis not present

## 2022-05-31 DIAGNOSIS — Z01818 Encounter for other preprocedural examination: Secondary | ICD-10-CM | POA: Diagnosis not present

## 2022-05-31 DIAGNOSIS — H579 Unspecified disorder of eye and adnexa: Secondary | ICD-10-CM

## 2022-05-31 DIAGNOSIS — D509 Iron deficiency anemia, unspecified: Secondary | ICD-10-CM

## 2022-05-31 DIAGNOSIS — C182 Malignant neoplasm of ascending colon: Secondary | ICD-10-CM | POA: Diagnosis not present

## 2022-05-31 DIAGNOSIS — E785 Hyperlipidemia, unspecified: Secondary | ICD-10-CM | POA: Diagnosis not present

## 2022-05-31 DIAGNOSIS — I10 Essential (primary) hypertension: Secondary | ICD-10-CM

## 2022-05-31 DIAGNOSIS — I700 Atherosclerosis of aorta: Secondary | ICD-10-CM

## 2022-05-31 DIAGNOSIS — I2510 Atherosclerotic heart disease of native coronary artery without angina pectoris: Secondary | ICD-10-CM

## 2022-05-31 DIAGNOSIS — E1169 Type 2 diabetes mellitus with other specified complication: Secondary | ICD-10-CM | POA: Diagnosis not present

## 2022-05-31 LAB — VITAMIN B12: Vitamin B-12: 811 pg/mL (ref 211–911)

## 2022-05-31 LAB — CBC WITH DIFFERENTIAL/PLATELET
Basophils Absolute: 0.1 10*3/uL (ref 0.0–0.1)
Basophils Relative: 0.9 % (ref 0.0–3.0)
Eosinophils Absolute: 0.1 10*3/uL (ref 0.0–0.7)
Eosinophils Relative: 0.9 % (ref 0.0–5.0)
HCT: 28.4 % — ABNORMAL LOW (ref 36.0–46.0)
Hemoglobin: 8.8 g/dL — ABNORMAL LOW (ref 12.0–15.0)
Lymphocytes Relative: 25.2 % (ref 12.0–46.0)
Lymphs Abs: 1.7 10*3/uL (ref 0.7–4.0)
MCHC: 31 g/dL (ref 30.0–36.0)
MCV: 67.6 fl — ABNORMAL LOW (ref 78.0–100.0)
Monocytes Absolute: 0.4 10*3/uL (ref 0.1–1.0)
Monocytes Relative: 6.5 % (ref 3.0–12.0)
Neutro Abs: 4.6 10*3/uL (ref 1.4–7.7)
Neutrophils Relative %: 66.5 % (ref 43.0–77.0)
Platelets: 501 10*3/uL — ABNORMAL HIGH (ref 150.0–400.0)
RBC: 4.19 Mil/uL (ref 3.87–5.11)
RDW: 22.6 % — ABNORMAL HIGH (ref 11.5–15.5)
WBC: 6.9 10*3/uL (ref 4.0–10.5)

## 2022-05-31 LAB — IRON: Iron: 32 ug/dL — ABNORMAL LOW (ref 42–145)

## 2022-05-31 MED ORDER — VITAMIN B-12 1000 MCG PO TABS: 1000.0000 ug | ORAL_TABLET | Freq: Every day | ORAL | 0 refills | Status: DC

## 2022-05-31 MED ORDER — FOLIC ACID 1 MG PO TABS: 1.0000 mg | ORAL_TABLET | Freq: Every day | ORAL | 0 refills | Status: DC

## 2022-05-31 NOTE — Assessment & Plan Note (Signed)
Planning lap R hemi colectomy  Anemia noted Lab today

## 2022-05-31 NOTE — Assessment & Plan Note (Signed)
Noted incidentally on CT scan  Takes atorvastatin- and LDL is well controlled Good bp control No clinical symptoms  Will continue to follow

## 2022-05-31 NOTE — Assessment & Plan Note (Signed)
Presumably from colon cancer  Hb up to 9.3 last check after 1 u transfusion and oral iron  Much clinical improvement Lab today in prep for upcoming hemicolectomy

## 2022-05-31 NOTE — Assessment & Plan Note (Signed)
In setting of some atherosclerosis  Disc goals for lipids and reasons to control them Rev last labs with pt Rev low sat fat diet in detail LDL of 73 Continues atorvastatin 40 mg daily with good diet

## 2022-05-31 NOTE — Patient Instructions (Addendum)
I will work on the surgery clearance paperwork /pending anemia labs   I would like you to see cardiology in the future  I put the referral in  Please let us know if you don't hear in 1-2 weeks    I will refill [id number] and folic acid   Lab today for anemia   If symptoms change please let us know

## 2022-05-31 NOTE — Assessment & Plan Note (Signed)
Per pt very good control  Last A1c was 6.1  Sees endocrinology  No hypoglycemia

## 2022-05-31 NOTE — Assessment & Plan Note (Signed)
Now taking B12 1000 mcg daily  Takes omeprazole  Level today

## 2022-05-31 NOTE — Progress Notes (Signed)
Subjective:    Patient ID: Donna Murphy, female    DOB: 1945/08/11, 77 y.o.   MRN: [id number]  HPI Pt presents for surgical clearance for colectomy  Wt Readings from Last 3 Encounters:  05/31/22 120 lb 8 oz (54.7 kg)  05/16/22 124 lb 9.6 oz (56.5 kg)  05/10/22 125 lb (56.7 kg)   21.35 kg/m  Vitals:   05/31/22 1054  BP: 136/80  Pulse: 88  Temp: (!) 97.4 F (36.3 C)  SpO2: 99%      Planning lab R hemicolectomy under gen anesthesia for colon cancer  Will do gatorade as part of clean out     Doing better with iron and better cbc Not out of breath  Still walking  Not dizzy like she was     Allergies  Allergen Reactions   Ciprofloxacin     Arm numbness   Sulfonamide Derivatives     Hives, rash   No topical allergies   No asthma or lung problems Has never smokes    HTN bp is stable today  No cp or palpitations or headaches or edema  No side effects to medicines  BP Readings from Last 3 Encounters:  05/31/22 136/80  05/16/22 (!) 170/68  05/10/22 134/76    Pulse Readings from Last 3 Encounters:  05/31/22 88  05/16/22 88  05/10/22 67    Amlodipine 5 mg daily  On recent CT Aortic atherosclerosis Also 3 vessel CAD Calcif of aortic valve   DM2 Sees Dr Donna Murphy  Doing well overall - 6.1 was last A1c  No low readings     Onc is Dr Donna Murphy   Metformin  Januvia  Farxiga   Hyperlipidemia Lab Results  Component Value Date   CHOL 149 04/13/2021   HDL 52.50 04/13/2021   LDLCALC 73 04/13/2021   TRIG 113.0 04/13/2021   CHOLHDL 3 04/13/2021   Atorvastatin 40 mg daily   Anemia Lab Results  Component Value Date   WBC 7.7 05/16/2022   HGB 9.3 (L) 05/16/2022   HCT 32.1 (L) 05/16/2022   MCV 72.3 (L) 05/16/2022   PLT 531 (H) 05/16/2022   Lab Results  Component Value Date   IRON 10 (L) 05/05/2022   TIBC 503 (H) 05/05/2022   FERRITIN 3 (L) 05/05/2022    Taking iron  Has 1 u transfusion on 2/3  Also [id number]  Also folic acid   Taking  fergon 648 mg daily (with metamucil for constipation)    Has to plan glaucoma surgery after the colon surgery    Surgeon did note lesion in upper pole of L kidney on her CT  Is open to seeing cardiology  Takes asa 81 mg daily  ? Why Has always taken  Patient Active Problem List   Diagnosis Date Noted   Vitamin B12 deficiency 05/31/2022   Aortic atherosclerosis ([place]) 05/31/2022   CAD (coronary artery disease) 05/31/2022   Pre-operative examination for internal medicine 05/31/2022   Primary adenocarcinoma of ascending colon ([place]) 05/11/2022   Symptomatic anemia 05/05/2022   Microcytic anemia 05/02/2022   Eye pressure 04/14/2022   SOB (shortness of breath) on exertion 04/14/2022   Hyperlipidemia associated with type 2 diabetes mellitus ([place]) 02/18/2019   Screening mammogram, encounter for 02/18/2019   Hx of colonic polyps 03/01/2016   Encounter for Medicare annual wellness exam 11/12/2013   Rash and nonspecific skin eruption 06/03/2012   Other screening mammogram 05/24/2011   Gynecological examination 05/24/2011   DM (diabetes mellitus), type 2 (  [place]) 12/04/2006   Essential hypertension 07/12/2006   VARICOSE VEIN 07/12/2006   ALLERGIC RHINITIS 07/12/2006   GERD 07/12/2006   IRRITABLE BOWEL SYNDROME 07/12/2006   Past Medical History:  Diagnosis Date   Allergic rhinitis    Allergy    Blood transfusion without reported diagnosis 05/06/2022   DMII (diabetes mellitus, type 2) ([place])    Fibroids    uterine   GERD (gastroesophageal reflux disease)    HLD (hyperlipidemia)    HTN (hypertension)    Hx of colonic polyps 03/01/2016   Irritable bowel syndrome    Kidney stone    passed stone-no surgery required   Skin cancer of lip    basal cell   Varices of other sites    Past Surgical History:  Procedure Laterality Date   COLONOSCOPY  12/2005   negative - gessner   COLONOSCOPY WITH ESOPHAGOGASTRODUODENOSCOPY (EGD)  05/10/2022   ENDOMETRIAL BIOPSY  01/2000   negative    TONSILLECTOMY     TOOTH EXTRACTION     Social History   Tobacco Use   Smoking status: Never   Smokeless tobacco: Never  Vaping Use   Vaping Use: Never used  Substance Use Topics   Alcohol use: Yes    Alcohol/week: 7.0 - 10.0 standard drinks of alcohol    Types: 7 - 10 Glasses of wine per week    Comment: a glass a wine 3 times a week   Drug use: No   Family History  Problem Relation Age of Onset   Coronary artery disease Mother    Hyperlipidemia Mother    Hypertension Mother    Osteoporosis Mother    Heart attack Mother        CABG in her 64's   Diabetes Father    Heart failure Father    Hypertension Father    Transient ischemic attack Father    Diabetes Unknown        GF   Hypertension Brother    Heart murmur Son    Brain cancer Sister    Colon cancer Neg Hx    Esophageal cancer Neg Hx    Stomach cancer Neg Hx    Rectal cancer Neg Hx    Allergies  Allergen Reactions   Ciprofloxacin     Arm numbness   Sulfonamide Derivatives     Hives, rash   Current Outpatient Medications on File Prior to Visit  Medication Sig Dispense Refill   amLODipine (NORVASC) 5 MG tablet Take 1 tablet (5 mg total) by mouth daily. 90 tablet 0   aspirin 81 MG tablet Take 81 mg by mouth daily.     atorvastatin (LIPITOR) 40 MG tablet TAKE 1 TABLET BY MOUTH EVERY DAY 90 tablet 0   cetirizine (ZYRTEC) 10 MG tablet Take 10 mg by mouth daily.     Cholecalciferol (VITAMIN D) 2000 UNITS tablet Take 2,000 Units by mouth daily.     famotidine (PEPCID) 20 MG tablet TAKE 1 TABLET BY MOUTH EVERYDAY AT BEDTIME (Patient taking differently: Take 20 mg by mouth at bedtime.) 90 tablet 0   FARXIGA 10 MG TABS tablet Take 10 mg by mouth daily.  6   ferrous gluconate (FERGON) 324 MG tablet Take 648 mg by mouth daily with breakfast.     glucose blood (ONETOUCH VERIO) test strip Ck blood sugar once daily and as directed. Dx E11.9 100 each 1   JANUVIA 100 MG tablet Take 100 mg by mouth daily.  Evant  300  MG CAPS Take 1 capsule by mouth daily.     metFORMIN (GLUCOPHAGE) 500 MG tablet Take 1,000 mg by mouth 2 (two) times daily with a meal.     Multiple Vitamin (MULTIVITAMIN) tablet Take 1 tablet by mouth daily.     omeprazole (PRILOSEC) 20 MG capsule Take 20 mg by mouth 2 (two) times daily before a meal.     psyllium (HYDROCIL/METAMUCIL) 95 % PACK Take 1 packet by mouth daily.     No current facility-administered medications on file prior to visit.    Review of Systems  Constitutional:  Positive for fatigue. Negative for activity change, appetite change, fever and unexpected weight change.  HENT:  Negative for congestion, ear pain, rhinorrhea, sinus pressure and sore throat.   Eyes:  Negative for pain, redness and visual disturbance.  Respiratory:  Negative for cough, shortness of breath and wheezing.        No longer sob with improved anemia   Cardiovascular:  Negative for chest pain and palpitations.  Gastrointestinal:  Negative for abdominal pain, blood in stool, constipation and diarrhea.  Endocrine: Negative for polydipsia and polyuria.  Genitourinary:  Negative for dysuria, frequency and urgency.  Musculoskeletal:  Negative for arthralgias, back pain and myalgias.  Skin:  Negative for pallor and rash.  Allergic/Immunologic: Negative for environmental allergies.  Neurological:  Negative for dizziness, syncope and headaches.  Hematological:  Negative for adenopathy. Does not bruise/bleed easily.  Psychiatric/Behavioral:  Negative for decreased concentration and dysphoric mood. The patient is not nervous/anxious.        Objective:   Physical Exam Constitutional:      General: She is not in acute distress.    Appearance: Normal appearance. She is well-developed and normal weight. She is not ill-appearing or diaphoretic.  HENT:     Head: Normocephalic and atraumatic.     Right Ear: Tympanic membrane and ear canal normal.     Left Ear: Tympanic membrane and ear canal normal.      Nose: Nose normal. No congestion.     Mouth/Throat:     Mouth: Mucous membranes are moist.     Pharynx: Oropharynx is clear. No posterior oropharyngeal erythema.  Eyes:     General: No scleral icterus.    Extraocular Movements: Extraocular movements intact.     Conjunctiva/sclera: Conjunctivae normal.     Pupils: Pupils are equal, round, and reactive to light.  Neck:     Thyroid: No thyromegaly.     Vascular: No carotid bruit or JVD.  Cardiovascular:     Rate and Rhythm: Normal rate and regular rhythm.     Pulses: Normal pulses.     Heart sounds: Normal heart sounds.     No gallop.  Pulmonary:     Effort: Pulmonary effort is normal. No respiratory distress.     Breath sounds: Normal breath sounds. No stridor. No wheezing or rhonchi.     Comments: Good air exch Chest:     Chest wall: No tenderness.  Abdominal:     General: Bowel sounds are normal. There is no distension or abdominal bruit.     Palpations: Abdomen is soft. There is no mass.     Tenderness: There is no abdominal tenderness. There is no guarding or rebound.     Hernia: No hernia is present.  Genitourinary:    Comments: Breast exam: No mass, nodules, thickening, tenderness, bulging, retraction, inflamation, nipple discharge or skin changes noted.  No axillary or clavicular LA.  Musculoskeletal:        General: No tenderness. Normal range of motion.     Cervical back: Normal range of motion and neck supple. No rigidity. No muscular tenderness.     Right lower leg: No edema.     Left lower leg: No edema.     Comments: No kyphosis   Lymphadenopathy:     Cervical: No cervical adenopathy.  Skin:    General: Skin is warm and dry.     Coloration: Skin is not pale.     Findings: No erythema or rash.  Neurological:     Mental Status: She is alert. Mental status is at baseline.     Cranial Nerves: No cranial nerve deficit.     Motor: No abnormal muscle tone.     Coordination: Coordination normal.     Gait: Gait  normal.     Deep Tendon Reflexes: Reflexes are normal and symmetric. Reflexes normal.  Psychiatric:        Mood and Affect: Mood normal.        Cognition and Memory: Cognition and memory normal.           Assessment & Plan:   Problem List Items Addressed This Visit       Cardiovascular and Mediastinum   Aortic atherosclerosis ([place])    Noted incidentally on CT scan  Takes atorvastatin- and LDL is well controlled Good bp control No clinical symptoms  Will continue to follow       CAD (coronary artery disease)    Incidental finding on CT scan, 3 vessel  No symptoms of angina ever  Diabetic Well controlled bp Well controlled lipids with atorvastatin 40 mg   Interested in consult with cardiology  Takes 81 mg asa daily (pt started herself years ago)  ER precautions noted for any anginal symptoms       Essential hypertension    bp in fair control at this time  BP Readings from Last 1 Encounters:  05/31/22 136/80  No changes needed Most recent labs reviewed  Disc lifstyle change with low sodium diet and exercise  Plan to continue amlodipine 5 mg         Digestive   Primary adenocarcinoma of ascending colon ([place])    Planning lap R hemi colectomy  Anemia noted Lab today       Relevant Medications   folic acid (FOLVITE) 1 MG tablet     Endocrine   DM (diabetes mellitus), type 2 ([place])    Per pt very good control  Last A1c was 6.1  Sees endocrinology  No hypoglycemia       Hyperlipidemia associated with type 2 diabetes mellitus ([place])    In setting of some atherosclerosis  Disc goals for lipids and reasons to control them Rev last labs with pt Rev low sat fat diet in detail LDL of 73 Continues atorvastatin 40 mg daily with good diet         Other   Eye pressure    Pt is planning eye surgery after her colon surgery      Microcytic anemia    Presumably from colon cancer  Hb up to 9.3 last check after 1 u transfusion and oral iron  Much clinical  improvement Lab today in prep for upcoming hemicolectomy      Relevant Medications   cyanocobalamin (VITAMIN B12) 123XX123 MCG tablet   folic acid (FOLVITE) 1 MG tablet   Other Relevant Orders   CBC with Differential/Platelet  Iron   Pre-operative examination for internal medicine - Primary    Pt is prepping for R hemi colectomy/ laparoscopic with gen anesth  On asa 81 mg- will need to hold per surgeon  Mod risk status in light of chronic medical problems of DM and HTN (well controlled) along with signs of atherosclerosis on her recent CT scan (taking statin) without symptoms  Rev allergies No prob with anesthesia in past  Rev last EKG  Reassuring exam today  Lab pending for her iron def anemia      Vitamin B12 deficiency    Now taking B12 1000 mcg daily  Takes omeprazole  Level today      Relevant Orders   Vitamin B12

## 2022-05-31 NOTE — Assessment & Plan Note (Signed)
bp in fair control at this time  BP Readings from Last 1 Encounters:  05/31/22 136/80   No changes needed Most recent labs reviewed  Disc lifstyle change with low sodium diet and exercise  Plan to continue amlodipine 5 mg

## 2022-05-31 NOTE — Assessment & Plan Note (Signed)
Pt is planning eye surgery after her colon surgery

## 2022-05-31 NOTE — Assessment & Plan Note (Signed)
Incidental finding on CT scan, 3 vessel  No symptoms of angina ever  Diabetic Well controlled bp Well controlled lipids with atorvastatin 40 mg   Interested in consult with cardiology  Takes 81 mg asa daily (pt started herself years ago)  ER precautions noted for any anginal symptoms

## 2022-05-31 NOTE — Assessment & Plan Note (Signed)
Pt is prepping for R hemi colectomy/ laparoscopic with gen anesth  On asa 81 mg- will need to hold per surgeon  Mod risk status in light of chronic medical problems of DM and HTN (well controlled) along with signs of atherosclerosis on her recent CT scan (taking statin) without symptoms  Rev allergies No prob with anesthesia in past  Rev last EKG  Reassuring exam today  Lab pending for her iron def anemia

## 2022-06-02 ENCOUNTER — Encounter: Payer: Self-pay | Admitting: *Deleted

## 2022-06-02 ENCOUNTER — Other Ambulatory Visit: Payer: Self-pay | Admitting: *Deleted

## 2022-06-02 DIAGNOSIS — C182 Malignant neoplasm of ascending colon: Secondary | ICD-10-CM

## 2022-06-02 NOTE — Progress Notes (Signed)
PATIENT NAVIGATOR PROGRESS NOTE  Name: Donna Murphy Date: 06/02/2022 MRN: [id number]  DOB: 29-Jun-1945   Reason for visit:  Telephone call regarding most recent labs  Comments:  Donna Murphy called regarding lab work from Donna Murphy visit and concerned that her Hgb was 8.8 although iron level had improved. She stated that she is walking an hour day just not going up hills and not experiencing any shortness of breath.  She asked if her hgb was too low for surgery.    Discussed with Donna Card NP and advised pt to come in for labs on Monday and based on those results IV iron could be considered.  Scheduled for lab appt on Monday, 06/05/22 at 0945. Lab orders entered   Pt verbalized understanding of plan.     Time spent counseling/coordinating care: 30-45 minutes

## 2022-06-05 ENCOUNTER — Inpatient Hospital Stay: Payer: Medicare Other | Attending: Oncology

## 2022-06-05 DIAGNOSIS — C182 Malignant neoplasm of ascending colon: Secondary | ICD-10-CM | POA: Insufficient documentation

## 2022-06-05 DIAGNOSIS — K219 Gastro-esophageal reflux disease without esophagitis: Secondary | ICD-10-CM | POA: Insufficient documentation

## 2022-06-05 DIAGNOSIS — R0609 Other forms of dyspnea: Secondary | ICD-10-CM | POA: Insufficient documentation

## 2022-06-05 DIAGNOSIS — I10 Essential (primary) hypertension: Secondary | ICD-10-CM | POA: Insufficient documentation

## 2022-06-05 DIAGNOSIS — K589 Irritable bowel syndrome without diarrhea: Secondary | ICD-10-CM | POA: Insufficient documentation

## 2022-06-05 DIAGNOSIS — R42 Dizziness and giddiness: Secondary | ICD-10-CM | POA: Insufficient documentation

## 2022-06-05 DIAGNOSIS — E119 Type 2 diabetes mellitus without complications: Secondary | ICD-10-CM | POA: Diagnosis not present

## 2022-06-05 DIAGNOSIS — D509 Iron deficiency anemia, unspecified: Secondary | ICD-10-CM | POA: Diagnosis not present

## 2022-06-05 LAB — CBC WITH DIFFERENTIAL (CANCER CENTER ONLY)
Abs Immature Granulocytes: 0.03 10*3/uL (ref 0.00–0.07)
Basophils Absolute: 0.1 10*3/uL (ref 0.0–0.1)
Basophils Relative: 1 %
Eosinophils Absolute: 0.1 10*3/uL (ref 0.0–0.5)
Eosinophils Relative: 2 %
HCT: 30.3 % — ABNORMAL LOW (ref 36.0–46.0)
Hemoglobin: 8.9 g/dL — ABNORMAL LOW (ref 12.0–15.0)
Immature Granulocytes: 0 %
Lymphocytes Relative: 28 %
Lymphs Abs: 2.4 10*3/uL (ref 0.7–4.0)
MCH: 20.6 pg — ABNORMAL LOW (ref 26.0–34.0)
MCHC: 29.4 g/dL — ABNORMAL LOW (ref 30.0–36.0)
MCV: 70.3 fL — ABNORMAL LOW (ref 80.0–100.0)
Monocytes Absolute: 0.5 10*3/uL (ref 0.1–1.0)
Monocytes Relative: 6 %
Neutro Abs: 5.4 10*3/uL (ref 1.7–7.7)
Neutrophils Relative %: 63 %
Platelet Count: 389 10*3/uL (ref 150–400)
RBC: 4.31 MIL/uL (ref 3.87–5.11)
RDW: 20.6 % — ABNORMAL HIGH (ref 11.5–15.5)
WBC Count: 8.5 10*3/uL (ref 4.0–10.5)
nRBC: 0 % (ref 0.0–0.2)

## 2022-06-05 LAB — IRON AND TIBC
Iron: 45 ug/dL (ref 28–170)
Saturation Ratios: 9 % — ABNORMAL LOW (ref 10.4–31.8)
TIBC: 501 ug/dL — ABNORMAL HIGH (ref 250–450)
UIBC: 456 ug/dL

## 2022-06-05 LAB — FERRITIN: Ferritin: 4 ng/mL — ABNORMAL LOW (ref 11–307)

## 2022-06-06 ENCOUNTER — Other Ambulatory Visit: Payer: Self-pay | Admitting: Family Medicine

## 2022-06-07 ENCOUNTER — Ambulatory Visit: Payer: Medicare Other

## 2022-06-12 ENCOUNTER — Ambulatory Visit
Admission: RE | Admit: 2022-06-12 | Discharge: 2022-06-12 | Disposition: A | Discharge status: Home / Self Care | Payer: Medicare Other | Source: Ambulatory Visit | Attending: Surgery | Admitting: Surgery

## 2022-06-12 DIAGNOSIS — N2889 Other specified disorders of kidney and ureter: Secondary | ICD-10-CM | POA: Diagnosis not present

## 2022-06-12 DIAGNOSIS — C182 Malignant neoplasm of ascending colon: Secondary | ICD-10-CM | POA: Diagnosis not present

## 2022-06-12 MED ORDER — GADOPICLENOL 0.5 MMOL/ML IV SOLN
6.0000 mL | Freq: Once | INTRAVENOUS | Status: AC | PRN
  Administered 2022-06-12: 6 mL via INTRAVENOUS

## 2022-06-16 ENCOUNTER — Other Ambulatory Visit: Payer: Self-pay | Admitting: *Deleted

## 2022-06-16 ENCOUNTER — Encounter: Payer: Self-pay | Admitting: *Deleted

## 2022-06-16 DIAGNOSIS — C182 Malignant neoplasm of ascending colon: Secondary | ICD-10-CM

## 2022-06-16 NOTE — Progress Notes (Signed)
PATIENT NAVIGATOR PROGRESS NOTE  Name: Donna Murphy Date: 06/16/2022 MRN: [id number]  DOB: 24-Nov-1945   Reason for visit:  F/U visit  Comments:  Spoke with pt and arranged for an office visit with labs on 3/18 to discuss possible Iron infusion prior to surgery    Time spent counseling/coordinating care: 30-45 minutes

## 2022-06-19 ENCOUNTER — Ambulatory Visit: Payer: Self-pay | Admitting: Surgery

## 2022-06-19 ENCOUNTER — Inpatient Hospital Stay: Payer: Medicare Other

## 2022-06-19 ENCOUNTER — Encounter: Payer: Self-pay | Admitting: Nurse Practitioner

## 2022-06-19 ENCOUNTER — Inpatient Hospital Stay (HOSPITAL_BASED_OUTPATIENT_CLINIC_OR_DEPARTMENT_OTHER): Payer: Medicare Other | Admitting: Nurse Practitioner

## 2022-06-19 VITALS — BP 148/74 | HR 88 | Temp 98.1°F | Resp 18 | Ht 63.0 in | Wt 121.0 lb

## 2022-06-19 DIAGNOSIS — C182 Malignant neoplasm of ascending colon: Secondary | ICD-10-CM

## 2022-06-19 DIAGNOSIS — I10 Essential (primary) hypertension: Secondary | ICD-10-CM | POA: Diagnosis not present

## 2022-06-19 DIAGNOSIS — Z01818 Encounter for other preprocedural examination: Secondary | ICD-10-CM

## 2022-06-19 DIAGNOSIS — E119 Type 2 diabetes mellitus without complications: Secondary | ICD-10-CM | POA: Diagnosis not present

## 2022-06-19 DIAGNOSIS — D509 Iron deficiency anemia, unspecified: Secondary | ICD-10-CM | POA: Diagnosis not present

## 2022-06-19 DIAGNOSIS — K219 Gastro-esophageal reflux disease without esophagitis: Secondary | ICD-10-CM | POA: Diagnosis not present

## 2022-06-19 DIAGNOSIS — K589 Irritable bowel syndrome without diarrhea: Secondary | ICD-10-CM | POA: Diagnosis not present

## 2022-06-19 DIAGNOSIS — R739 Hyperglycemia, unspecified: Secondary | ICD-10-CM

## 2022-06-19 LAB — CBC WITH DIFFERENTIAL (CANCER CENTER ONLY)
Abs Immature Granulocytes: 0.01 10*3/uL (ref 0.00–0.07)
Basophils Absolute: 0.1 10*3/uL (ref 0.0–0.1)
Basophils Relative: 1 %
Eosinophils Absolute: 0.1 10*3/uL (ref 0.0–0.5)
Eosinophils Relative: 2 %
HCT: 29.2 % — ABNORMAL LOW (ref 36.0–46.0)
Hemoglobin: 8.3 g/dL — ABNORMAL LOW (ref 12.0–15.0)
Immature Granulocytes: 0 %
Lymphocytes Relative: 22 %
Lymphs Abs: 1.6 10*3/uL (ref 0.7–4.0)
MCH: 20.1 pg — ABNORMAL LOW (ref 26.0–34.0)
MCHC: 28.4 g/dL — ABNORMAL LOW (ref 30.0–36.0)
MCV: 70.7 fL — ABNORMAL LOW (ref 80.0–100.0)
Monocytes Absolute: 0.5 10*3/uL (ref 0.1–1.0)
Monocytes Relative: 6 %
Neutro Abs: 5 10*3/uL (ref 1.7–7.7)
Neutrophils Relative %: 69 %
Platelet Count: 441 10*3/uL — ABNORMAL HIGH (ref 150–400)
RBC: 4.13 MIL/uL (ref 3.87–5.11)
RDW: 20.2 % — ABNORMAL HIGH (ref 11.5–15.5)
WBC Count: 7.2 10*3/uL (ref 4.0–10.5)
nRBC: 0 % (ref 0.0–0.2)

## 2022-06-19 LAB — IRON AND TIBC
Iron: 15 ug/dL — ABNORMAL LOW (ref 28–170)
Saturation Ratios: 3 % — ABNORMAL LOW (ref 10.4–31.8)
TIBC: 490 ug/dL — ABNORMAL HIGH (ref 250–450)
UIBC: 475 ug/dL

## 2022-06-19 LAB — FERRITIN: Ferritin: 3 ng/mL — ABNORMAL LOW (ref 11–307)

## 2022-06-19 NOTE — Progress Notes (Signed)
Sent message, via epic in basket, requesting orders in epic from surgeon.  

## 2022-06-19 NOTE — Progress Notes (Signed)
  Donna Murphy OFFICE PROGRESS NOTE   Diagnosis: Colon cancer  INTERVAL HISTORY:   Donna Murphy returns for follow-up due to persistent iron deficiency anemia.  To review, she has adenocarcinoma of the ascending colon and is scheduled for surgery by Donna Murphy on 07/07/2022.  She has persistent iron deficiency anemia despite oral supplementation.  She is seen today to discuss IV iron.  She reports taking ferrous gluconate 2 tabs on a daily basis consistently since her last office visit.  No constipation.  No nausea.  She is not aware of any bleeding.  She has mild dyspnea on exertion.  She notes dizziness when she leans over.  Objective:  Vital signs in last 24 hours:  Blood pressure (!) 148/74, pulse 88, temperature 98.1 F (36.7 C), temperature source Oral, resp. rate 18, height 5\' 3"  (1.6 m), weight 121 lb (54.9 kg), SpO2 100 %.    HEENT: Conjunctival pallor. Resp: Lungs clear bilaterally. Cardio: Regular rate and rhythm. GI: Abdomen soft and nontender.  No hepatosplenomegaly. Vascular: No leg edema.   Lab Results:  Lab Results  Component Value Date   WBC 7.2 06/19/2022   HGB 8.3 (L) 06/19/2022   HCT 29.2 (L) 06/19/2022   MCV 70.7 (L) 06/19/2022   PLT 441 (H) 06/19/2022   NEUTROABS 5.0 06/19/2022    Imaging:  No results found.  Medications: I have reviewed the patient's current medications.  Assessment/Plan: Ascending colon cancer Colonoscopy 05/10/2022-small ulcerated mass in the ascending colon, invasive moderately differentiated adenocarcinoma Normal CEA CTs 05/23/2022-no evidence of metastatic disease.  Indeterminate potentially enhancing lesion anterior aspect of the upper pole of the left kidney MRI abdomen 06/12/2022-small simple renal cyst.  No suspicious renal masses. 2.  History of colon polyps-sessile serrated polyp and tubular adenoma in the ascending colon 2017 Colonoscopy November 2019-no polyps 3.  Type 2 diabetes 4.  Gastroesophageal reflux  disease 5.   Seasonal allergies 6.   Diarrhea illness, colitis?  As a child 7.   IBS 8.   Hypertension 9.   Aortic atherosclerosis, calcifications of the aortic valve on the CT scan from 05/23/2022.  She has been referred to cardiology. 10.  Tiny nonaggressive nonenhancing 0.3 cm superior pancreatic body cystic lesion on abdominal MRI 06/12/2022-follow-up MRI abdomen with and without IV contrast recommended at a 2-year interval. 11.  Iron deficiency anemia  Disposition: Donna Murphy appears stable.  She is scheduled for the colon surgery on 07/07/2022.  We reviewed the CBC and ferritin from today.  She has persistent iron deficiency anemia despite compliance with oral iron.  We discussed a trial of IV iron including the potential for an allergic reaction.  She does not want to proceed with IV iron.  She would agree to a blood transfusion if Donna Murphy felt this was necessary prior to surgery.  We discussed the potential for an allergic reaction and the infection risk.  She would like for [place] to message Donna Murphy with her labs from today which we have done.  She understands we can make arrangements for the blood transfusion if Donna Murphy feels this is necessary prior to the upcoming surgery.  She will return for follow-up 2 to 3 weeks after surgery.  We are available to see her sooner if needed.  Plan reviewed with Dr. Benay Spice.    Ned Card ANP/GNP-BC   06/19/2022  11:33 AM

## 2022-06-21 ENCOUNTER — Other Ambulatory Visit: Payer: Self-pay | Admitting: Family Medicine

## 2022-06-21 NOTE — Progress Notes (Addendum)
Anesthesia Review:  PCP: [place] [facility] [place] 05/31/22  Cardiologist : none  Endocrinologistg- DR Chalmers Cater  Chest x-ray : CT Chest- 05/24/22  EKG : 05/08/22  Echo : Stress test: Cardiac Cath :  Activity level: can do a flight of stairs without difficutly  Sleep Study/ CPAP : none  Fasting Blood Sugar :      / Checks Blood Sugar -- times a day:   Blood Thinner/ Instructions /Last Dose: ASA / Instructions/ Last Dose :   DM- type 2 - checks glucose  every other week per pt  Hgba1c- 06/26/22- 7.1 routed ot Dr Dema Severin on 06/27/22.   Wilder Glade- hold for 72 hours prior to surgery- Last dose 07/03/22.   Januvia and Metformin- no doses day of surgery.    PT was instructed to notify Endocrinologist in regards to surgery and bowel prep instructions and med instructions in regards to diabetic meds.  Informed pt at rpeop that diabetic instructions are per hospital protocol.   PT also concerned about being covered with Insulin while in hospital.  Preop nurse informed pt that Insulin coverage if needed would be on siliding  scale basis.    CBCdone 06/26/22 routed to DR Nadeen Landau.  HGb- 8.3.  Janett Billow [facility] aware.

## 2022-06-23 NOTE — Patient Instructions (Signed)
SURGICAL WAITING ROOM VISITATION  Patients having surgery or a procedure may have no more than 2 support people in the waiting area - these visitors may rotate.    Children under the age of 47 must have an adult with them who is not the patient.  Due to an increase in RSV and influenza rates and associated hospitalizations, children ages 26 and under may not visit patients in [place].  If the patient needs to stay at the hospital during part of their recovery, the visitor guidelines for inpatient rooms apply. Pre-op nurse will coordinate an appropriate time for 1 support person to accompany patient in pre-op.  This support person may not rotate.    Please refer to the [facility] website for the visitor guidelines for Inpatients (after your surgery is over and you are in a regular room).       Your procedure is scheduled on:  07/07/2022    Report to [facility] Main Entrance    Report to admitting at      1030AM   Call this number if you have problems the morning of surgery [phone]      Clear liquid diet on day of bowel prep.    After Midnight you may have the following liquids until _0930_____ AM DAY OF SURGERY  Water Non-Citrus Juices (without pulp, NO RED-Apple, White grape, White cranberry) Black Coffee (NO MILK/CREAM OR CREAMERS, sugar ok)  Clear Tea (NO MILK/CREAM OR CREAMERS, sugar ok) regular and decaf                             Plain Jell-O (NO RED)                                           Fruit ices (not with fruit pulp, NO RED)                                     Popsicles (NO RED)                                                               Sports drinks like Gatorade (NO RED)                    The day of surgery:  Drink ONE (1) Pre-Surgery Clear Ensure or G2 at   0930AM the morning of surgery. Drink in one sitting. Do not sip.  This drink was given to you during your hospital  pre-op appointment visit. Nothing else to drink after  completing the  Pre-Surgery Clear Ensure or G2.          If you have questions, please contact your surgeon's office.   FOLLOW BOWEL PREP AND ANY ADDITIONAL PRE OP INSTRUCTIONS YOU RECEIVED FROM YOUR SURGEON'S OFFICE!!!     Oral Hygiene is also important to reduce your risk of infection.  Remember - BRUSH YOUR TEETH THE MORNING OF SURGERY WITH YOUR REGULAR TOOTHPASTE  DENTURES WILL BE REMOVED PRIOR TO SURGERY PLEASE DO NOT APPLY "Poly grip" OR ADHESIVES!!!   Do NOT smoke after Midnight   Take these medicines the morning of surgery with A SIP OF WATER:  amlodipine, zyrtec, omeprazole     DO NOT TAKE ANY ORAL DIABETIC MEDICATIONS DAY OF YOUR SURGERY  Bring CPAP mask and tubing day of surgery.                              You may not have any metal on your body including hair pins, jewelry, and body piercing             Do not wear make-up, lotions, powders, perfumes/cologne, or deodorant  Do not wear nail polish including gel and S&S, artificial/acrylic nails, or any other type of covering on natural nails including finger and toenails. If you have artificial nails, gel coating, etc. that needs to be removed by a nail salon please have this removed prior to surgery or surgery may need to be canceled/ delayed if the surgeon/ anesthesia feels like they are unable to be safely monitored.   Do not shave  48 hours prior to surgery.               Men may shave face and neck.   Do not bring valuables to the hospital. [place].   Contacts, glasses, dentures or bridgework may not be worn into surgery.   Bring small overnight bag day of surgery.   DO NOT [place]. PHARMACY WILL DISPENSE MEDICATIONS LISTED ON YOUR MEDICATION LIST TO YOU DURING YOUR ADMISSION [place]!    Patients discharged on the day of surgery will not be allowed to drive home.  Someone NEEDS  to stay with you for the first 24 hours after anesthesia.   Special Instructions: Bring a copy of your healthcare power of attorney and living will documents the day of surgery if you haven't scanned them before.              Please read over the following fact sheets you were given: IF [place] [phone]   If you received a COVID test during your pre-op visit  it is requested that you wear a mask when out in public, stay away from anyone that may not be feeling well and notify your surgeon if you develop symptoms. If you test positive for Covid or have been in contact with anyone that has tested positive in the last 10 days please notify you surgeon.    [place] - Preparing for Surgery Before surgery, you can play an important role.  Because skin is not sterile, your skin needs to be as free of germs as possible.  You can reduce the number of germs on your skin by washing with CHG (chlorahexidine gluconate) soap before surgery.  CHG is an antiseptic cleaner which kills germs and bonds with the skin to continue killing germs even after washing. Please DO NOT use if you have an allergy to CHG or antibacterial soaps.  If your skin becomes reddened/irritated stop using the CHG and inform your nurse when you arrive at Short Stay. Do not shave (including  legs and underarms) for at least 48 hours prior to the first CHG shower.  You may shave your face/neck. Please follow these instructions carefully:  1.  Shower with CHG Soap the night before surgery and the  morning of Surgery.  2.  If you choose to wash your hair, wash your hair first as usual with your  normal  shampoo.  3.  After you shampoo, rinse your hair and body thoroughly to remove the  shampoo.                           4.  Use CHG as you would any other liquid soap.  You can apply chg directly  to the skin and wash                       Gently with a scrungie or clean washcloth.  5.   Apply the CHG Soap to your body ONLY FROM THE NECK DOWN.   Do not use on face/ open                           Wound or open sores. Avoid contact with eyes, ears mouth and genitals (private parts).                       Wash face,  Genitals (private parts) with your normal soap.             6.  Wash thoroughly, paying special attention to the area where your surgery  will be performed.  7.  Thoroughly rinse your body with warm water from the neck down.  8.  DO NOT shower/wash with your normal soap after using and rinsing off  the CHG Soap.                9.  Pat yourself dry with a clean towel.            10.  Wear clean pajamas.            11.  Place clean sheets on your bed the night of your first shower and do not  sleep with pets. Day of Surgery : Do not apply any lotions/deodorants the morning of surgery.  Please wear clean clothes to the hospital/surgery center.  FAILURE TO FOLLOW THESE INSTRUCTIONS MAY RESULT IN THE CANCELLATION OF YOUR SURGERY PATIENT SIGNATURE_________________________________  NURSE SIGNATURE__________________________________  ________________________________________________________________________

## 2022-06-26 ENCOUNTER — Encounter (HOSPITAL_COMMUNITY): Payer: Self-pay

## 2022-06-26 ENCOUNTER — Encounter (HOSPITAL_COMMUNITY)
Admission: RE | Admit: 2022-06-26 | Discharge: 2022-06-26 | Disposition: A | Discharge status: Home / Self Care | Payer: Medicare Other | Source: Ambulatory Visit | Attending: Surgery | Admitting: Surgery

## 2022-06-26 ENCOUNTER — Other Ambulatory Visit: Payer: Self-pay

## 2022-06-26 DIAGNOSIS — Z01818 Encounter for other preprocedural examination: Secondary | ICD-10-CM

## 2022-06-26 DIAGNOSIS — Z01812 Encounter for preprocedural laboratory examination: Secondary | ICD-10-CM | POA: Diagnosis not present

## 2022-06-26 DIAGNOSIS — R739 Hyperglycemia, unspecified: Secondary | ICD-10-CM | POA: Insufficient documentation

## 2022-06-26 HISTORY — DX: Anemia, unspecified: D64.9

## 2022-06-26 HISTORY — DX: Dyspnea, unspecified: R06.00

## 2022-06-26 HISTORY — DX: Unspecified osteoarthritis, unspecified site: M19.90

## 2022-06-26 HISTORY — DX: Personal history of urinary calculi: Z87.442

## 2022-06-26 LAB — CBC WITH DIFFERENTIAL/PLATELET
Abs Immature Granulocytes: 0.02 10*3/uL (ref 0.00–0.07)
Basophils Absolute: 0 10*3/uL (ref 0.0–0.1)
Basophils Relative: 0 %
Eosinophils Absolute: 0.1 10*3/uL (ref 0.0–0.5)
Eosinophils Relative: 2 %
HCT: 29.6 % — ABNORMAL LOW (ref 36.0–46.0)
Hemoglobin: 8.3 g/dL — ABNORMAL LOW (ref 12.0–15.0)
Immature Granulocytes: 0 %
Lymphocytes Relative: 27 %
Lymphs Abs: 1.8 10*3/uL (ref 0.7–4.0)
MCH: 20 pg — ABNORMAL LOW (ref 26.0–34.0)
MCHC: 28 g/dL — ABNORMAL LOW (ref 30.0–36.0)
MCV: 71.3 fL — ABNORMAL LOW (ref 80.0–100.0)
Monocytes Absolute: 0.4 10*3/uL (ref 0.1–1.0)
Monocytes Relative: 7 %
Neutro Abs: 4.3 10*3/uL (ref 1.7–7.7)
Neutrophils Relative %: 64 %
Platelets: 457 10*3/uL — ABNORMAL HIGH (ref 150–400)
RBC: 4.15 MIL/uL (ref 3.87–5.11)
RDW: 19.9 % — ABNORMAL HIGH (ref 11.5–15.5)
WBC: 6.7 10*3/uL (ref 4.0–10.5)
nRBC: 0 % (ref 0.0–0.2)

## 2022-06-26 LAB — COMPREHENSIVE METABOLIC PANEL
ALT: 45 U/L — ABNORMAL HIGH (ref 0–44)
AST: 43 U/L — ABNORMAL HIGH (ref 15–41)
Albumin: 4.1 g/dL (ref 3.5–5.0)
Alkaline Phosphatase: 67 U/L (ref 38–126)
Anion gap: 10 (ref 5–15)
BUN: 17 mg/dL (ref 8–23)
CO2: 23 mmol/L (ref 22–32)
Calcium: 8.9 mg/dL (ref 8.9–10.3)
Chloride: 104 mmol/L (ref 98–111)
Creatinine, Ser: 0.46 mg/dL (ref 0.44–1.00)
GFR, Estimated: >60 mL/min (ref >60)
Glucose, Bld: 150 mg/dL — ABNORMAL HIGH (ref 70–99)
Potassium: 3.9 mmol/L (ref 3.5–5.1)
Sodium: 137 mmol/L (ref 135–145)
Total Bilirubin: 0.3 mg/dL (ref 0.3–1.2)
Total Protein: 7.4 g/dL (ref 6.5–8.1)

## 2022-06-26 LAB — GLUCOSE, CAPILLARY: Glucose-Capillary: 153 mg/dL — ABNORMAL HIGH (ref 70–99)

## 2022-06-27 ENCOUNTER — Telehealth: Payer: Self-pay | Admitting: Family Medicine

## 2022-06-27 ENCOUNTER — Ambulatory Visit: Payer: Medicare Other | Admitting: Oncology

## 2022-06-27 LAB — HEMOGLOBIN A1C
Hgb A1c MFr Bld: 7.1 % — ABNORMAL HIGH (ref 4.8–5.6)
Mean Plasma Glucose: 157 mg/dL

## 2022-06-27 NOTE — Telephone Encounter (Signed)
Called patient to schedule Medicare Annual Wellness Visit (AWV). Left message for patient to call back and schedule Medicare Annual Wellness Visit (AWV).  Last date of AWV: 04/13/2021  Please schedule an appointment at any time with [facility].  If any questions, please contact me at [phone].  Thank you ,  [place] Direct Dial: [phone]

## 2022-06-28 ENCOUNTER — Other Ambulatory Visit: Payer: Self-pay | Admitting: Family Medicine

## 2022-06-28 DIAGNOSIS — Z1231 Encounter for screening mammogram for malignant neoplasm of breast: Secondary | ICD-10-CM

## 2022-07-03 ENCOUNTER — Ambulatory Visit: Payer: Medicare Other | Admitting: Internal Medicine

## 2022-07-07 ENCOUNTER — Inpatient Hospital Stay (HOSPITAL_COMMUNITY): Payer: Medicare Other | Admitting: Certified Registered Nurse Anesthetist

## 2022-07-07 ENCOUNTER — Other Ambulatory Visit: Payer: Self-pay

## 2022-07-07 ENCOUNTER — Inpatient Hospital Stay (HOSPITAL_COMMUNITY)
Admission: RE | Admit: 2022-07-07 | Discharge: 2022-07-10 | DRG: 330 | Disposition: A | Discharge status: Home / Self Care | Payer: Medicare Other | Attending: Surgery | Admitting: Surgery

## 2022-07-07 ENCOUNTER — Inpatient Hospital Stay (HOSPITAL_COMMUNITY): Payer: Medicare Other | Admitting: Physician Assistant

## 2022-07-07 ENCOUNTER — Encounter (HOSPITAL_COMMUNITY): Payer: Self-pay | Admitting: Surgery

## 2022-07-07 ENCOUNTER — Encounter (HOSPITAL_COMMUNITY)
Admission: RE | Disposition: A | Discharge status: Home / Self Care | Payer: Self-pay | Source: Home / Self Care | Attending: Surgery

## 2022-07-07 DIAGNOSIS — I10 Essential (primary) hypertension: Secondary | ICD-10-CM

## 2022-07-07 DIAGNOSIS — D62 Acute posthemorrhagic anemia: Secondary | ICD-10-CM | POA: Diagnosis not present

## 2022-07-07 DIAGNOSIS — J309 Allergic rhinitis, unspecified: Secondary | ICD-10-CM | POA: Diagnosis present

## 2022-07-07 DIAGNOSIS — Z87442 Personal history of urinary calculi: Secondary | ICD-10-CM | POA: Diagnosis not present

## 2022-07-07 DIAGNOSIS — D630 Anemia in neoplastic disease: Secondary | ICD-10-CM | POA: Diagnosis present

## 2022-07-07 DIAGNOSIS — Z9049 Acquired absence of other specified parts of digestive tract: Secondary | ICD-10-CM | POA: Diagnosis not present

## 2022-07-07 DIAGNOSIS — K5730 Diverticulosis of large intestine without perforation or abscess without bleeding: Secondary | ICD-10-CM | POA: Diagnosis present

## 2022-07-07 DIAGNOSIS — Z8249 Family history of ischemic heart disease and other diseases of the circulatory system: Secondary | ICD-10-CM | POA: Diagnosis not present

## 2022-07-07 DIAGNOSIS — Z833 Family history of diabetes mellitus: Secondary | ICD-10-CM | POA: Diagnosis not present

## 2022-07-07 DIAGNOSIS — Z85819 Personal history of malignant neoplasm of unspecified site of lip, oral cavity, and pharynx: Secondary | ICD-10-CM

## 2022-07-07 DIAGNOSIS — Z85828 Personal history of other malignant neoplasm of skin: Secondary | ICD-10-CM | POA: Diagnosis not present

## 2022-07-07 DIAGNOSIS — K589 Irritable bowel syndrome without diarrhea: Secondary | ICD-10-CM | POA: Diagnosis present

## 2022-07-07 DIAGNOSIS — Z86010 Personal history of colonic polyps: Secondary | ICD-10-CM

## 2022-07-07 DIAGNOSIS — E785 Hyperlipidemia, unspecified: Secondary | ICD-10-CM | POA: Diagnosis present

## 2022-07-07 DIAGNOSIS — C182 Malignant neoplasm of ascending colon: Secondary | ICD-10-CM | POA: Diagnosis not present

## 2022-07-07 DIAGNOSIS — I2510 Atherosclerotic heart disease of native coronary artery without angina pectoris: Secondary | ICD-10-CM | POA: Diagnosis present

## 2022-07-07 DIAGNOSIS — Z882 Allergy status to sulfonamides status: Secondary | ICD-10-CM | POA: Diagnosis not present

## 2022-07-07 DIAGNOSIS — E119 Type 2 diabetes mellitus without complications: Secondary | ICD-10-CM | POA: Diagnosis not present

## 2022-07-07 DIAGNOSIS — Z7984 Long term (current) use of oral hypoglycemic drugs: Secondary | ICD-10-CM | POA: Diagnosis not present

## 2022-07-07 DIAGNOSIS — Z881 Allergy status to other antibiotic agents status: Secondary | ICD-10-CM | POA: Diagnosis not present

## 2022-07-07 DIAGNOSIS — K219 Gastro-esophageal reflux disease without esophagitis: Secondary | ICD-10-CM | POA: Diagnosis present

## 2022-07-07 DIAGNOSIS — E1165 Type 2 diabetes mellitus with hyperglycemia: Secondary | ICD-10-CM | POA: Diagnosis present

## 2022-07-07 DIAGNOSIS — Z01818 Encounter for other preprocedural examination: Secondary | ICD-10-CM

## 2022-07-07 HISTORY — PX: LAPAROSCOPIC PARTIAL RIGHT COLECTOMY: SHX5913

## 2022-07-07 LAB — GLUCOSE, CAPILLARY
Glucose-Capillary: 236 mg/dL — ABNORMAL HIGH (ref 70–99)
Glucose-Capillary: 178 mg/dL — ABNORMAL HIGH (ref 70–99)
Glucose-Capillary: 182 mg/dL — ABNORMAL HIGH (ref 70–99)
Glucose-Capillary: 223 mg/dL — ABNORMAL HIGH (ref 70–99)
Glucose-Capillary: 254 mg/dL — ABNORMAL HIGH (ref 70–99)

## 2022-07-07 LAB — TYPE AND SCREEN
ABO/RH(D): A NEG
Antibody Screen: NEGATIVE

## 2022-07-07 SURGERY — LAPAROSCOPIC PARTIAL RIGHT COLECTOMY
Anesthesia: General | Site: Abdomen | Laterality: Right

## 2022-07-07 MED ORDER — SUGAMMADEX SODIUM 200 MG/2ML IV SOLN
INTRAVENOUS | Status: DC | PRN
  Administered 2022-07-07: 200 mg via INTRAVENOUS

## 2022-07-07 MED ORDER — SODIUM CHLORIDE 0.9 % IV SOLN
2.0000 g | INTRAVENOUS | Status: AC
  Administered 2022-07-07: 2 g via INTRAVENOUS
  Filled 2022-07-07: qty 2

## 2022-07-07 MED ORDER — SIMETHICONE 80 MG PO CHEW
40.0000 mg | CHEWABLE_TABLET | Freq: Four times a day (QID) | ORAL | Status: DC | PRN
  Administered 2022-07-08 – 2022-07-09 (×3): 40 mg via ORAL
  Filled 2022-07-07 (×3): qty 1

## 2022-07-07 MED ORDER — ALVIMOPAN 12 MG PO CAPS
12.0000 mg | ORAL_CAPSULE | ORAL | Status: AC
  Administered 2022-07-07: 12 mg via ORAL
  Filled 2022-07-07: qty 1

## 2022-07-07 MED ORDER — VITAMIN B-12 1000 MCG PO TABS
1000.0000 ug | ORAL_TABLET | Freq: Every day | ORAL | Status: DC
  Administered 2022-07-08 – 2022-07-10 (×3): 1000 ug via ORAL
  Filled 2022-07-07 (×3): qty 1

## 2022-07-07 MED ORDER — DEXAMETHASONE SODIUM PHOSPHATE 10 MG/ML IJ SOLN
INTRAMUSCULAR | Status: AC
  Filled 2022-07-07: qty 1

## 2022-07-07 MED ORDER — 0.9 % SODIUM CHLORIDE (POUR BTL) OPTIME
TOPICAL | Status: DC | PRN
  Administered 2022-07-07: 2000 mL

## 2022-07-07 MED ORDER — POLYETHYLENE GLYCOL 3350 17 GM/SCOOP PO POWD: 1.0000 | Freq: Once | ORAL | Status: DC

## 2022-07-07 MED ORDER — KETAMINE HCL 50 MG/5ML IJ SOSY
PREFILLED_SYRINGE | INTRAMUSCULAR | Status: AC
  Filled 2022-07-07: qty 5

## 2022-07-07 MED ORDER — INSULIN ASPART 100 UNIT/ML IJ SOLN
0.0000 [IU] | Freq: Three times a day (TID) | INTRAMUSCULAR | Status: DC
  Administered 2022-07-07: 3 [IU] via SUBCUTANEOUS
  Administered 2022-07-08 (×3): 2 [IU] via SUBCUTANEOUS
  Administered 2022-07-09: 3 [IU] via SUBCUTANEOUS
  Administered 2022-07-09 (×2): 2 [IU] via SUBCUTANEOUS
  Administered 2022-07-10: 3 [IU] via SUBCUTANEOUS

## 2022-07-07 MED ORDER — BUPIVACAINE HCL 0.25 % IJ SOLN
INTRAMUSCULAR | Status: AC
  Filled 2022-07-07: qty 1

## 2022-07-07 MED ORDER — HEPARIN SODIUM (PORCINE) 5000 UNIT/ML IJ SOLN
5000.0000 [IU] | Freq: Once | INTRAMUSCULAR | Status: AC
  Administered 2022-07-07: 5000 [IU] via SUBCUTANEOUS
  Filled 2022-07-07: qty 1

## 2022-07-07 MED ORDER — HYDROMORPHONE HCL 1 MG/ML IJ SOLN
0.5000 mg | INTRAMUSCULAR | Status: DC | PRN
  Administered 2022-07-07 – 2022-07-08 (×3): 0.5 mg via INTRAVENOUS
  Filled 2022-07-07 (×3): qty 0.5

## 2022-07-07 MED ORDER — FAMOTIDINE 20 MG PO TABS
20.0000 mg | ORAL_TABLET | Freq: Every day | ORAL | Status: DC
  Administered 2022-07-07 – 2022-07-09 (×3): 20 mg via ORAL
  Filled 2022-07-07 (×3): qty 1

## 2022-07-07 MED ORDER — LIDOCAINE HCL (PF) 2 % IJ SOLN
INTRAMUSCULAR | Status: AC
  Filled 2022-07-07: qty 5

## 2022-07-07 MED ORDER — LIDOCAINE 2% (20 MG/ML) 5 ML SYRINGE
INTRAMUSCULAR | Status: DC | PRN
  Administered 2022-07-07: 1.5 mg/kg/h via INTRAVENOUS
  Administered 2022-07-07: 60 mg via INTRAVENOUS

## 2022-07-07 MED ORDER — KETAMINE HCL 10 MG/ML IJ SOLN
INTRAMUSCULAR | Status: DC | PRN
  Administered 2022-07-07: 10 mg via INTRAVENOUS
  Administered 2022-07-07 (×2): 20 mg via INTRAVENOUS

## 2022-07-07 MED ORDER — FOLIC ACID 1 MG PO TABS
1.0000 mg | ORAL_TABLET | Freq: Every day | ORAL | Status: DC
  Administered 2022-07-08 – 2022-07-10 (×3): 1 mg via ORAL
  Filled 2022-07-07 (×3): qty 1

## 2022-07-07 MED ORDER — LACTATED RINGERS IV SOLN: INTRAVENOUS | Status: DC

## 2022-07-07 MED ORDER — INSULIN ASPART 100 UNIT/ML IJ SOLN
0.0000 [IU] | Freq: Every day | INTRAMUSCULAR | Status: DC
  Administered 2022-07-07: 3 [IU] via SUBCUTANEOUS
  Administered 2022-07-09: 2 [IU] via SUBCUTANEOUS

## 2022-07-07 MED ORDER — LACTATED RINGERS IR SOLN
Status: DC | PRN
  Administered 2022-07-07: 1000 mL

## 2022-07-07 MED ORDER — BUPIVACAINE LIPOSOME 1.3 % IJ SUSP
INTRAMUSCULAR | Status: DC | PRN
  Administered 2022-07-07: 20 mL

## 2022-07-07 MED ORDER — HYDROMORPHONE HCL 1 MG/ML IJ SOLN: 0.2500 mg | INTRAMUSCULAR | Status: DC | PRN

## 2022-07-07 MED ORDER — BUPIVACAINE LIPOSOME 1.3 % IJ SUSP
INTRAMUSCULAR | Status: AC
  Filled 2022-07-07: qty 20

## 2022-07-07 MED ORDER — ROCURONIUM BROMIDE 10 MG/ML (PF) SYRINGE
PREFILLED_SYRINGE | INTRAVENOUS | Status: AC
  Filled 2022-07-07: qty 10

## 2022-07-07 MED ORDER — TRAMADOL HCL 50 MG PO TABS: 50.0000 mg | ORAL_TABLET | Freq: Four times a day (QID) | ORAL | 0 refills | Status: AC | PRN

## 2022-07-07 MED ORDER — GLUCERNA SHAKE PO LIQD
237.0000 mL | Freq: Two times a day (BID) | ORAL | Status: DC
  Filled 2022-07-07 (×7): qty 237

## 2022-07-07 MED ORDER — ALVIMOPAN 12 MG PO CAPS
12.0000 mg | ORAL_CAPSULE | Freq: Two times a day (BID) | ORAL | Status: DC
  Administered 2022-07-08 (×2): 12 mg via ORAL
  Filled 2022-07-07 (×2): qty 1

## 2022-07-07 MED ORDER — FERROUS GLUCONATE 324 (38 FE) MG PO TABS
648.0000 mg | ORAL_TABLET | Freq: Every day | ORAL | Status: DC
  Administered 2022-07-08 – 2022-07-10 (×3): 648 mg via ORAL
  Filled 2022-07-07 (×3): qty 2

## 2022-07-07 MED ORDER — DIPHENHYDRAMINE HCL 12.5 MG/5ML PO ELIX: 12.5000 mg | ORAL_SOLUTION | Freq: Four times a day (QID) | ORAL | Status: DC | PRN

## 2022-07-07 MED ORDER — ONDANSETRON HCL 4 MG PO TABS: 4.0000 mg | ORAL_TABLET | Freq: Four times a day (QID) | ORAL | Status: DC | PRN

## 2022-07-07 MED ORDER — CHLORHEXIDINE GLUCONATE CLOTH 2 % EX PADS: 6.0000 | MEDICATED_PAD | Freq: Once | CUTANEOUS | Status: DC

## 2022-07-07 MED ORDER — ALUM & MAG HYDROXIDE-SIMETH 200-200-20 MG/5ML PO SUSP: 30.0000 mL | Freq: Four times a day (QID) | ORAL | Status: DC | PRN

## 2022-07-07 MED ORDER — METRONIDAZOLE 500 MG PO TABS: 1000.0000 mg | ORAL_TABLET | ORAL | Status: DC

## 2022-07-07 MED ORDER — PHENYLEPHRINE HCL-NACL 20-0.9 MG/250ML-% IV SOLN
INTRAVENOUS | Status: DC | PRN
  Administered 2022-07-07: 35 ug/min via INTRAVENOUS

## 2022-07-07 MED ORDER — ONDANSETRON HCL 4 MG/2ML IJ SOLN
INTRAMUSCULAR | Status: DC | PRN
  Administered 2022-07-07: 4 mg via INTRAVENOUS

## 2022-07-07 MED ORDER — ROCURONIUM BROMIDE 10 MG/ML (PF) SYRINGE
PREFILLED_SYRINGE | INTRAVENOUS | Status: DC | PRN
  Administered 2022-07-07: 50 mg via INTRAVENOUS
  Administered 2022-07-07: 20 mg via INTRAVENOUS

## 2022-07-07 MED ORDER — INSULIN ASPART 100 UNIT/ML IJ SOLN
INTRAMUSCULAR | Status: AC
  Filled 2022-07-07: qty 1

## 2022-07-07 MED ORDER — ONDANSETRON HCL 4 MG/2ML IJ SOLN
INTRAMUSCULAR | Status: AC
  Filled 2022-07-07: qty 2

## 2022-07-07 MED ORDER — PANTOPRAZOLE SODIUM 40 MG PO TBEC
40.0000 mg | DELAYED_RELEASE_TABLET | Freq: Every day | ORAL | Status: DC
  Administered 2022-07-08 – 2022-07-10 (×3): 40 mg via ORAL
  Filled 2022-07-07 (×3): qty 1

## 2022-07-07 MED ORDER — TRAMADOL HCL 50 MG PO TABS
50.0000 mg | ORAL_TABLET | Freq: Four times a day (QID) | ORAL | Status: DC | PRN
  Administered 2022-07-07 – 2022-07-10 (×9): 50 mg via ORAL
  Filled 2022-07-07 (×9): qty 1

## 2022-07-07 MED ORDER — ACETAMINOPHEN 500 MG PO TABS
1000.0000 mg | ORAL_TABLET | ORAL | Status: AC
  Administered 2022-07-07: 1000 mg via ORAL
  Filled 2022-07-07: qty 2

## 2022-07-07 MED ORDER — DIPHENHYDRAMINE HCL 50 MG/ML IJ SOLN: 12.5000 mg | Freq: Four times a day (QID) | INTRAMUSCULAR | Status: DC | PRN

## 2022-07-07 MED ORDER — PROPOFOL 10 MG/ML IV BOLUS
INTRAVENOUS | Status: DC | PRN
  Administered 2022-07-07: 100 mg via INTRAVENOUS

## 2022-07-07 MED ORDER — ASPIRIN 81 MG PO TBEC
81.0000 mg | DELAYED_RELEASE_TABLET | Freq: Every day | ORAL | Status: DC
  Administered 2022-07-08 – 2022-07-10 (×3): 81 mg via ORAL
  Filled 2022-07-07 (×3): qty 1

## 2022-07-07 MED ORDER — NEOMYCIN SULFATE 500 MG PO TABS: 1000.0000 mg | ORAL_TABLET | ORAL | Status: DC

## 2022-07-07 MED ORDER — ACETAMINOPHEN 500 MG PO TABS
1000.0000 mg | ORAL_TABLET | Freq: Four times a day (QID) | ORAL | Status: DC
  Administered 2022-07-07 – 2022-07-10 (×10): 1000 mg via ORAL
  Filled 2022-07-07 (×11): qty 2

## 2022-07-07 MED ORDER — IBUPROFEN 200 MG PO TABS
600.0000 mg | ORAL_TABLET | Freq: Four times a day (QID) | ORAL | Status: DC | PRN
  Administered 2022-07-08 – 2022-07-09 (×2): 600 mg via ORAL
  Filled 2022-07-07 (×2): qty 3

## 2022-07-07 MED ORDER — ATORVASTATIN CALCIUM 40 MG PO TABS
40.0000 mg | ORAL_TABLET | Freq: Every day | ORAL | Status: DC
  Administered 2022-07-08 – 2022-07-10 (×3): 40 mg via ORAL
  Filled 2022-07-07 (×3): qty 1

## 2022-07-07 MED ORDER — INSULIN ASPART 100 UNIT/ML IJ SOLN
4.0000 [IU] | Freq: Once | INTRAMUSCULAR | Status: AC
  Administered 2022-07-07: 4 [IU] via SUBCUTANEOUS

## 2022-07-07 MED ORDER — DEXAMETHASONE SODIUM PHOSPHATE 10 MG/ML IJ SOLN
INTRAMUSCULAR | Status: DC | PRN
  Administered 2022-07-07: 5 mg via INTRAVENOUS

## 2022-07-07 MED ORDER — ENSURE PRE-SURGERY PO LIQD: 296.0000 mL | Freq: Once | ORAL | Status: DC

## 2022-07-07 MED ORDER — FENTANYL CITRATE (PF) 250 MCG/5ML IJ SOLN
INTRAMUSCULAR | Status: AC
  Filled 2022-07-07: qty 5

## 2022-07-07 MED ORDER — BUPIVACAINE HCL (PF) 0.25 % IJ SOLN
INTRAMUSCULAR | Status: DC | PRN
  Administered 2022-07-07: 30 mL

## 2022-07-07 MED ORDER — FENTANYL CITRATE (PF) 250 MCG/5ML IJ SOLN
INTRAMUSCULAR | Status: DC | PRN
  Administered 2022-07-07 (×4): 50 ug via INTRAVENOUS

## 2022-07-07 MED ORDER — ENSURE PRE-SURGERY PO LIQD: 592.0000 mL | Freq: Once | ORAL | Status: DC

## 2022-07-07 MED ORDER — AMLODIPINE BESYLATE 5 MG PO TABS
5.0000 mg | ORAL_TABLET | Freq: Every day | ORAL | Status: DC
  Administered 2022-07-08 – 2022-07-10 (×3): 5 mg via ORAL
  Filled 2022-07-07 (×3): qty 1

## 2022-07-07 MED ORDER — VITAMIN D 25 MCG (1000 UNIT) PO TABS
2000.0000 [IU] | ORAL_TABLET | Freq: Every day | ORAL | Status: DC
  Administered 2022-07-08 – 2022-07-10 (×3): 2000 [IU] via ORAL
  Filled 2022-07-07 (×3): qty 2

## 2022-07-07 MED ORDER — LORATADINE 10 MG PO TABS
10.0000 mg | ORAL_TABLET | Freq: Every day | ORAL | Status: DC
  Administered 2022-07-08 – 2022-07-10 (×3): 10 mg via ORAL
  Filled 2022-07-07 (×3): qty 1

## 2022-07-07 MED ORDER — HEPARIN SODIUM (PORCINE) 5000 UNIT/ML IJ SOLN
5000.0000 [IU] | Freq: Three times a day (TID) | INTRAMUSCULAR | Status: DC
  Administered 2022-07-07 – 2022-07-10 (×8): 5000 [IU] via SUBCUTANEOUS
  Filled 2022-07-07 (×8): qty 1

## 2022-07-07 MED ORDER — BISACODYL 5 MG PO TBEC: 20.0000 mg | DELAYED_RELEASE_TABLET | Freq: Once | ORAL | Status: DC

## 2022-07-07 MED ORDER — BUPIVACAINE LIPOSOME 1.3 % IJ SUSP: 20.0000 mL | Freq: Once | INTRAMUSCULAR | Status: DC

## 2022-07-07 MED ORDER — ONDANSETRON HCL 4 MG/2ML IJ SOLN: 4.0000 mg | Freq: Four times a day (QID) | INTRAMUSCULAR | Status: DC | PRN

## 2022-07-07 MED ORDER — HYDRALAZINE HCL 20 MG/ML IJ SOLN
10.0000 mg | INTRAMUSCULAR | Status: DC | PRN
  Administered 2022-07-07: 10 mg via INTRAVENOUS
  Filled 2022-07-07: qty 1

## 2022-07-07 MED ORDER — PROPOFOL 10 MG/ML IV BOLUS
INTRAVENOUS | Status: AC
  Filled 2022-07-07: qty 20

## 2022-07-07 SURGICAL SUPPLY — 80 items
ADH SKN CLS APL DERMABOND .7 (GAUZE/BANDAGES/DRESSINGS) ×1
APPLIER CLIP 5 13 M/L LIGAMAX5 (MISCELLANEOUS)
APPLIER CLIP ROT 10 11.4 M/L (STAPLE)
APR CLP MED LRG 11.4X10 (STAPLE)
APR CLP MED LRG 5 ANG JAW (MISCELLANEOUS)
BAG COUNTER SPONGE SURGICOUNT (BAG) IMPLANT
BAG SPNG CNTER NS LX DISP (BAG)
BLADE EXTENDED COATED 6.5IN (ELECTRODE) IMPLANT
CABLE HIGH FREQUENCY MONO STRZ (ELECTRODE) IMPLANT
CATH MUSHROOM 28FR (CATHETERS) IMPLANT
CATH MUSHROOM 30FR (CATHETERS) IMPLANT
CELLS DAT CNTRL 66122 CELL SVR (MISCELLANEOUS) IMPLANT
CLIP APPLIE 5 13 M/L LIGAMAX5 (MISCELLANEOUS) IMPLANT
CLIP APPLIE ROT 10 11.4 M/L (STAPLE) IMPLANT
DERMABOND ADVANCED .7 DNX12 (GAUZE/BANDAGES/DRESSINGS) IMPLANT
DISSECTOR BLUNT TIP ENDO 5MM (MISCELLANEOUS) IMPLANT
DRAIN CHANNEL 19F RND (DRAIN) IMPLANT
DRAPE SURG IRRIG POUCH 19X23 (DRAPES) ×1 IMPLANT
DRSG OPSITE POSTOP 4X10 (GAUZE/BANDAGES/DRESSINGS) IMPLANT
DRSG OPSITE POSTOP 4X6 (GAUZE/BANDAGES/DRESSINGS) IMPLANT
DRSG OPSITE POSTOP 4X8 (GAUZE/BANDAGES/DRESSINGS) IMPLANT
ELECT REM PT RETURN 15FT ADLT (MISCELLANEOUS) ×1 IMPLANT
EVACUATOR SILICONE 100CC (DRAIN) IMPLANT
GAUZE SPONGE 4X4 12PLY STRL (GAUZE/BANDAGES/DRESSINGS) IMPLANT
GLOVE BIO SURGEON STRL SZ7.5 (GLOVE) ×2 IMPLANT
GLOVE INDICATOR 8.0 STRL GRN (GLOVE) ×2 IMPLANT
GOWN STRL REUS W/ TWL XL LVL3 (GOWN DISPOSABLE) ×4 IMPLANT
GOWN STRL REUS W/TWL XL LVL3 (GOWN DISPOSABLE) ×4
HOLDER FOLEY CATH W/STRAP (MISCELLANEOUS) ×1 IMPLANT
IRRIG SUCT STRYKERFLOW 2 WTIP (MISCELLANEOUS) ×1
IRRIGATION SUCT STRKRFLW 2 WTP (MISCELLANEOUS) ×1 IMPLANT
KIT TURNOVER KIT A (KITS) IMPLANT
LIGASURE IMPACT 36 18CM CVD LR (INSTRUMENTS) IMPLANT
NDL INSUFFLATION 14GA 120MM (NEEDLE) IMPLANT
NEEDLE INSUFFLATION 14GA 120MM (NEEDLE) IMPLANT
PACK COLON (CUSTOM PROCEDURE TRAY) ×1 IMPLANT
PAD POSITIONING PINK XL (MISCELLANEOUS) ×1 IMPLANT
PENCIL SMOKE EVACUATOR (MISCELLANEOUS) IMPLANT
RELOAD PROXIMATE 75MM BLUE (ENDOMECHANICALS) ×2 IMPLANT
RELOAD STAPLE 75 3.8 BLU REG (ENDOMECHANICALS) IMPLANT
RETRACTOR WND ALEXIS 18 MED (MISCELLANEOUS) IMPLANT
RTRCTR WOUND ALEXIS 18CM MED (MISCELLANEOUS)
SCISSORS LAP 5X35 DISP (ENDOMECHANICALS) ×1 IMPLANT
SEALER TISSUE G2 STRG ARTC 35C (ENDOMECHANICALS) ×1 IMPLANT
SET TUBE SMOKE EVAC HIGH FLOW (TUBING) ×1 IMPLANT
SLEEVE ADV FIXATION 5X100MM (TROCAR) ×2 IMPLANT
SPIKE FLUID TRANSFER (MISCELLANEOUS) ×1 IMPLANT
SPONGE DRAIN TRACH 4X4 STRL 2S (GAUZE/BANDAGES/DRESSINGS) IMPLANT
STAPLER 90 3.5 STAND SLIM (STAPLE) ×1
STAPLER 90 3.5 STD SLIM (STAPLE) IMPLANT
STAPLER GUN LINEAR PROX 60 (STAPLE) IMPLANT
STAPLER PROXIMATE 75MM BLUE (STAPLE) IMPLANT
STAPLER VISISTAT 35W (STAPLE) IMPLANT
SUT ETHILON 3 0 PS 1 (SUTURE) IMPLANT
SUT PDS AB 1 TP1 54 (SUTURE) IMPLANT
SUT PDS AB 1 TP1 96 (SUTURE) IMPLANT
SUT PROLENE 2 0 KS (SUTURE) ×1 IMPLANT
SUT PROLENE 2 0 SH DA (SUTURE) ×1 IMPLANT
SUT SILK 2 0 (SUTURE) ×1
SUT SILK 2 0 SH CR/8 (SUTURE) ×1 IMPLANT
SUT SILK 2-0 18XBRD TIE 12 (SUTURE) ×1 IMPLANT
SUT SILK 3 0 (SUTURE) ×1
SUT SILK 3 0 SH CR/8 (SUTURE) ×1 IMPLANT
SUT SILK 3-0 18XBRD TIE 12 (SUTURE) ×1 IMPLANT
SUT VIC AB 2-0 SH 27 (SUTURE)
SUT VIC AB 2-0 SH 27X BRD (SUTURE) IMPLANT
SUT VIC AB 3-0 SH 18 (SUTURE) IMPLANT
SUT VIC AB 3-0 SH 27 (SUTURE)
SUT VIC AB 3-0 SH 27X BRD (SUTURE) IMPLANT
SUT VICRYL 2 0 18  UND BR (SUTURE) ×1
SUT VICRYL 2 0 18 UND BR (SUTURE) ×1 IMPLANT
SYS LAPSCP GELPORT 120MM (MISCELLANEOUS)
SYS WOUND ALEXIS 18CM MED (MISCELLANEOUS)
SYSTEM LAPSCP GELPORT 120MM (MISCELLANEOUS) IMPLANT
SYSTEM WOUND ALEXIS 18CM MED (MISCELLANEOUS) IMPLANT
TOWEL OR 17X26 10 PK STRL BLUE (TOWEL DISPOSABLE) IMPLANT
TOWEL OR NON WOVEN STRL DISP B (DISPOSABLE) ×1 IMPLANT
TRAY IRRIG W/60CC SYR STRL (SET/KITS/TRAYS/PACK) ×1 IMPLANT
TROCAR ADV FIXATION 5X100MM (TROCAR) ×1 IMPLANT
TROCAR BALLN 12MMX100 BLUNT (TROCAR) IMPLANT

## 2022-07-07 NOTE — Transfer of Care (Signed)
Immediate Anesthesia Transfer of Care Note  Patient: Donna Murphy  Procedure(s) Performed: LAPAROSCOPIC RIGHT HEMICOLECTOMY (Right: Abdomen)  Patient Location: PACU  Anesthesia Type:General  Level of Consciousness: awake, alert , and oriented  Airway & Oxygen Therapy: Patient Spontanous Breathing and Patient connected to face mask oxygen  Post-op Assessment: Report given to RN and Post -op Vital signs reviewed and stable  Post vital signs: Reviewed and stable  Last Vitals:  Vitals Value Taken Time  BP 161/69 07/07/22 1332  Temp    Pulse 62 07/07/22 1334  Resp 22 07/07/22 1334  SpO2 98 % 07/07/22 1334  Vitals shown include unvalidated device data.  Last Pain:  Vitals:   07/07/22 1038  TempSrc:   PainSc: 0-No pain         Complications: No notable events documented.

## 2022-07-07 NOTE — Anesthesia Procedure Notes (Signed)
Procedure Name: Intubation Date/Time: 07/07/2022 11:43 AM  Performed by: Orest Dikes, CRNAPre-anesthesia Checklist: Patient identified, Emergency Drugs available, Suction available and Patient being monitored Patient Re-evaluated:Patient Re-evaluated prior to induction Oxygen Delivery Method: Circle system utilized Preoxygenation: Pre-oxygenation with 100% oxygen Induction Type: IV induction Ventilation: Mask ventilation without difficulty Laryngoscope Size: Mac and 4 Grade View: Grade I Tube type: Oral Tube size: 7.0 mm Number of attempts: 1 Airway Equipment and Method: Stylet Placement Confirmation: ETT inserted through vocal cords under direct vision, positive ETCO2 and breath sounds checked- equal and bilateral Secured at: 21 cm Tube secured with: Tape Dental Injury: Teeth and Oropharynx as per pre-operative assessment

## 2022-07-07 NOTE — Op Note (Signed)
PATIENT: Donna Murphy  77 y.o. female  Patient Care Team: [place], Audrie Gallus, MD as PCP - General Lew Dawes Sheria Lang, OD as Referring Physician (Optometry) Dorisann Frames, MD as Consulting Physician (Endocrinology)  PREOP DIAGNOSIS: Colon cancer - ascending colon  POSTOP DIAGNOSIS: Same  PROCEDURE: Laparoscopic right hemicolectomy Partial omentectomy  SURGEON: Stephanie Coup. Mikyle Sox, MD  ASSISTANT: Romie Levee, MD  ANESTHESIA: General endotracheal  EBL: 20 mL Total I/O In: 1100 [I.V.:1000; IV Piggyback:100] Out: 295 [Urine:275; Blood:20]  DRAINS: None  SPECIMEN: Right colon (including terminal ileum, appendix)  Transverse colon mesenteric lymph node Segment of omentum  COUNTS: Sponge, needle and instrument counts were reported correct x2  FINDINGS:  Tattoo in ascending colon as expected. Subtle mass in ascending colon juxta-tattoo. No full thickness extension apparent. Rubbery lymph nodes palpable along ileocolic chain included with specimen. Tattoo'd but not significantly enlarged lymph node in transverse mesocolon also excised and submitted as separate specimen.   NARRATIVE:  The patient was identified & brought into the operating room, placed supine on the operating table and SCDs were applied to the lower extremities. General endotracheal anesthesia was induced. She was positioned supine with left arm tucked. Antibiotics were administered. A foley catheter was placed under sterile conditions. Hair in the region of planned surgery was clipped. The abdomen was prepped and draped in a sterile fashion. A timeout was performed confirming our patient and plan.   Beginning with the planned extraction port, a small supraumbilical incision was made and carried down to the midline fascia. This was then incised with electrocautery. The peritoneum was identified and elevated between clamps and carefully opened sharply. A small Alexis wound protector with a cap and associated port was then  placed. The abdomen was insufflated to 15 mmHg with CO2. A laparoscope was placed and camera inspection revealed no evidence of injury. Bilateral TAP blocks were then performed under laparoscopic visualization using a mixture of 0.25% marcaine with epinepherine + Exparel. 3 additional ports were then placed under direct laparoscopic visualization - two in the left hemiabdomen and one in the right abdomen. The abdomen was surveyed. The liver and peritoneum appeared normal.  There were no signs of metastatic disease.  There is tattoo evident in the proximal ascending colon.  There is no visible full-thickness extension of any sort of mass at this location.  She was positioned in reverse trendelenburg with left side down. The ileocolic pedicle was identified. Gentle blunt dissection commenced around the pedicle.  The duodenum is ready identified medially.  Were able to dissect the mesocolon from the anterior surface of the duodenum without any difficulty.  In doing this, were able to free up the ileocolic pedicle.  This retromesenteric plane is continued up above the level of the Gerota's fascia of the right kidney and doing so we are able to mobilize the entire mesocolon off the retroperitoneum.  The ileocolic pedicle was then circumferentially dissected.  We are able to again confirm the duodenum is down well away from our planned point of transection.  The ileocolic pedicle is then ligated and divided using the laparoscopic Enseal device.  The stump is inspected and noted to be hemostatic.  The omentum was then elevated anteriorly and the mid transverse colon retracted caudad.  The lesser sac is readily gained by incising the space between the transverse colon and the peritoneum.  We are then able to continue this plane over towards the hepatic flexure.  The hepatic flexure was then completely mobilized from this approach  as well.  In doing so, the entire right colon is now mobilized.  The mesenteric cut edges  as well as the retroperitoneum were inspected and noted to be hemostatic.  At this point, the abdomen was desufflated and the terminal ileum and right colon delivered through the wound protector.  Towels were placed around the field.  The terminal ileum was then transected using a GIA blue load stapler.  A Babcock clamp was placed on the proximal staple line to assist in maintaining orientation.  We then identified our distal point of transection on the transverse colon at a location that includes the right branch of the middle colic vessels.  A window was created in the mesentery at this location.  The transverse colon was then divided using a GIA 75 mm blue load stapler.  Including the right branch of the middle colic vessel, the remaining mesentery was ligated and divided using the Enseal device.  The cut edge of the mesentery is inspected noted to be hemostatic.  The specimen is passed off.  We did note that there was 1 tattoo avid lymph node along her transverse mesocolon that we also excised separately and included as a separate specimen.  This area was then irrigated and hemostasis verified.  Attention was turned to creating the ileo-colic anastomosis. The distal ileum and transverse colon were inspected for orientation to ensure no twisting nor bowel included in the mesenteric defect. An anastomosis was created between the distal ileum and the transverse colon using a 75 mm GIA blue load stapler on each respective antimesenteric edge. The staple line was inspected and noted to be hemostatic.  The common enterotomy channel was closed using a TA 90 blue load stapler after distracting the anastomotic staple lines. Hemostasis was achieved at the staple line using 3-0 silk U-stitches. 2-0 silk sutures were used to imbricate the corners of the TA staple line as well.  A 2-0 silk suture was placed securing the apex of the anastomosis. The anastomosis was palpated and noted to be widely patent, 3  fingerbreadths in diameter. This was then placed back into the abdomen. The abdomen was then reinspected and hemostasis verified.  There is a somewhat redundant tongue of omentum with questionable perfusion that we opted to excise in a partial omentectomy manner.  This was passed off as additional specimen.  The omentum was then brought down over the anastomosis. The wound protector cap was replaced and CO2 reinsufflated. The laparoscopic ports were removed under direct visualization and the sites noted to be hemostatic. The Alexis wound protector was removed.  All sponge, needle, and instrument counts are reported correct. We switched to clean instruments, gowns and drapes.  The fascia was then closed using two running #1 PDS sutures.  All final counts were then reported correct.  The skin of all incision sites was closed with 4-0 monocryl subcuticular suture.  The abdomen is then washed and dried.  Dermabond was placed over all incisions. She was then awakened from anesthesia, extubated, transferred to a stretcher for transport to recovery in satisfactory condition.  DISPOSITION: PACU in satisfactory condition

## 2022-07-07 NOTE — H&P (Signed)
CC: Here today for surgery  HPI: Donna Murphy is an 77 y.o. female with history of HTN, DM, HLD, GERD, whom is seen in the office today as a referral by Dr. Leone PayorGessner for evaluation of colon cancer  Colonoscopy 05/10/22 with Dr. Leone PayorGessner: - Rule out malignancy, tumor in the ascending colon. Biopsied. Tattooed. - Diverticulosis in the sigmoid colon. - The examination was otherwise normal on direct and retroflexion views. - Personal history of colonic polyps. 02/2016 - ssp/a w/ focus high grade dysplasia and tubular adenoma - both in ascending colon 02/21/2018 no polyps  PATH: - INVASIVE MODERATELY DIFFERENTIATED ADENOCARCINOMA  CT CAP 05/23/22: 1. No definitive findings to suggest metastatic disease in the chest, abdomen or pelvis. 2. There is, however, an indeterminate potentially enhancing lesion in the anterior aspect of the upper pole of the left kidney concerning for potential renal neoplasm. Further evaluation with follow-up nonemergent abdominal MRI with and without IV gadolinium is strongly recommended in the near future to provide definitive characterization. 3. Colonic diverticulosis without evidence of acute diverticulitis at this time. 4. Aortic atherosclerosis, in addition to left main and three-vessel coronary artery disease. Assessment for potential risk factor modification, dietary therapy or pharmacologic therapy may be warranted, if clinically indicated. 5. There are calcifications of the aortic valve. Echocardiographic correlation for evaluation of potential valvular dysfunction may be warranted if clinically indicated.  She reports back in August, 2023 she had some gelatinous type stool that was dark but without frank blood. There was some apparent blood admixed in the stool. Since then, no further bleeding. She was found to be anemic after she had reported to her endocrinologist that she had issues with significant fatigue. She was admitted to the hospital in early  February and underwent workup. As noted above. She has not been taking iron supplement and has had dramatic improvement in all of her fatigue type symptoms. She currently walks 2 to 3 miles per day without restriction. She is able to walk up 2 flights of stairs without any chest pain or shortness of breath.  Her PCP is Dr. Roxy MannsMarne Tower with [facility]  She denies any changes in her health or health history since we met in the office. States she is ready for surgery. Tolerating bowel prep with satisfactory result.  PMH: HTN, DM, HLD, GERD; skin cancer from left upprt lip  PSH: Tonsillectomy in 1960s; otherwise, denies any other surgical history  FHx: Her sister had a brain tumor; otherwise denies any known family history of colorectal, breast, endometrial or ovarian cancer  Social Hx: Denies use of tobacco/illicit drug. Drinks 1 glass of wine at dinner. 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 Her other daughter-in-law is an anesthesiologist at [employer]   Past Medical History:  Diagnosis Date   Allergic rhinitis    Allergy    Anemia    Arthritis    Blood transfusion without reported diagnosis 05/06/2022   DMII (diabetes mellitus, type 2) (HCC)    Dyspnea    due to anemia   Fibroids    uterine   GERD (gastroesophageal reflux disease)    History of kidney stones    HLD (hyperlipidemia)    HTN (hypertension)    Hx of colonic polyps 03/01/2016   Irritable bowel syndrome    Skin cancer of lip    basal cell   Varices of other sites     Past Surgical History:  Procedure Laterality Date  COLONOSCOPY  12/2005   negative - gessner   COLONOSCOPY WITH ESOPHAGOGASTRODUODENOSCOPY (EGD)  05/10/2022   ENDOMETRIAL BIOPSY  01/2000   negative   TONSILLECTOMY     TOOTH EXTRACTION      Family History  Problem Relation Age of Onset   Coronary artery disease Mother    Hyperlipidemia Mother    Hypertension Mother     Osteoporosis Mother    Heart attack Mother        CABG in her 57's   Diabetes Father    Heart failure Father    Hypertension Father    Transient ischemic attack Father    Diabetes Unknown        GF   Hypertension Brother    Heart murmur Son    Brain cancer Sister    Colon cancer Neg Hx    Esophageal cancer Neg Hx    Stomach cancer Neg Hx    Rectal cancer Neg Hx     Social:  reports that she has never smoked. She has never used smokeless tobacco. She reports current alcohol use of about 7.0 - 10.0 standard drinks of alcohol per week. She reports that she does not use drugs.  Allergies:  Allergies  Allergen Reactions   Ciprofloxacin     Arm numbness   Sulfonamide Derivatives Hives and Rash    Medications: I have reviewed the patient's current medications.  No results found for this or any previous visit (from the past 48 hour(s)).  No results found.  PE There were no vitals taken for this visit. Constitutional: NAD; conversant Eyes: Moist conjunctiva Lungs: Normal respiratory effort CV: RRR GI: Abd soft, NT/ND Psychiatric: Appropriate affect  No results found for this or any previous visit (from the past 48 hour(s)).  No results found.   A/P: Donna Murphy is an 77 y.o. female with hx of HTN, DM, HLD, GERD here for evaluation of ascending colon cancer - biopsied, tattoo'd; anemia  CT A/P 05/23/22 -  1. No definitive findings to suggest metastatic disease in the chest, abdomen or pelvis. 2. There is, however, an indeterminate potentially enhancing lesion in the anterior aspect of the upper pole of the left kidney concerning for potential renal neoplasm. Further evaluation with follow-up nonemergent abdominal MRI with and without IV gadolinium is strongly recommended in the near future to provide definitive characterization. 3. Colonic diverticulosis without evidence of acute diverticulitis at this time. 4. Aortic atherosclerosis, in addition to left main and  three-vessel coronary artery disease. Assessment for potential risk factor modification, dietary therapy or pharmacologic therapy may be warranted, if clinically indicated. 5. There are calcifications of the aortic valve. Echocardiographic correlation for evaluation of potential valvular dysfunction may be warranted if clinically indicated.  Albumin 4.5 Last hgb 9.5 (previously in the 7's)  CEA 1.89 (05/16/22)  -MRI Abd 06/12/22 - simple renal cysts, no suspicious renal masses. 3 mm superior pancreatic body cystic lesion - follow-up MRI in 2 yrs.  -Medical clearance for surgery from her PCP, Dr. Roxy Manns obtained  -The anatomy and physiology of the GI tract was reviewed with the patient. The pathophysiology of colon cancer was discussed as well with associated pictures. -We have discussed various different treatment options going forward including surgery (the most definitive) to address this - laparoscopic right hemicolectomy -The planned procedure, material risks (including, but not limited to, pain, bleeding, infection, scarring, need for blood transfusion, damage to surrounding structures- blood vessels/nerves/viscus/organs, damage to ureter, urine leak, leak from anastomosis,  need for additional procedures, scenarios where a stoma may be necessary and where it may be permanent, worsening of pre-existing medical conditions, hernia, recurrence, pneumonia, heart attack, stroke, death) benefits and alternatives to surgery were discussed at length. The patient's questions were answered to her satisfaction, she voiced understanding and elected to proceed with surgery. Additionally, we discussed typical postoperative expectations and the recovery process.   Marin Olp, MD [facility], A [facility]

## 2022-07-07 NOTE — Discharge Instructions (Signed)
POST OP INSTRUCTIONS AFTER COLON SURGERY  DIET: Be sure to include lots of fluids daily to stay hydrated - 64oz of water per day (8, 8 oz glasses).  Avoid fast food or heavy meals for the first couple of weeks as your are more likely to get nauseated. Avoid raw/uncooked fruits or vegetables for the first 4 weeks (its ok to have these if they are blended into smoothie form). If you have fruits/vegetables, make sure they are cooked until soft enough to mash on the roof of your mouth and chew your food well. Otherwise, diet as tolerated.  Take your usually prescribed home medications unless otherwise directed.  PAIN CONTROL: Pain is best controlled by a usual combination of three different methods TOGETHER: Ice/Heat Over the counter pain medication Prescription pain medication Most patients will experience some swelling and bruising around the surgical site.  Ice packs or heating pads (30-60 minutes up to 6 times a day) will help. Some people prefer to use ice alone, heat alone, alternating between ice & heat.  Experiment to what works for you.  Swelling and bruising can take several weeks to resolve.   It is helpful to take an over-the-counter pain medication regularly for the first few weeks: Ibuprofen (Motrin/Advil) - 200mg tabs - take 3 tabs (600mg) every 6 hours as needed for pain (unless you have been directed previously to avoid NSAIDs/ibuprofen) Acetaminophen (Tylenol) - you may take 650mg every 6 hours as needed. You can take this with motrin as they act differently on the body. If you are taking a narcotic pain medication that has acetaminophen in it, do not take over the counter tylenol at the same time. NOTE: You may take both of these medications together - most patients  find it most helpful when alternating between the two (i.e. Ibuprofen at 6am, tylenol at 9am, ibuprofen at 12pm ...) A  prescription for pain medication should be given to you upon discharge.  Take your pain medication as  prescribed if your pain is not adequatly controlled with the over-the-counter pain reliefs mentioned above.  Avoid getting constipated.  Between the surgery and the pain medications, it is common to experience some constipation.  Increasing fluid intake and taking a fiber supplement (such as Metamucil, Citrucel, FiberCon, MiraLax, etc) 1-2 times a day regularly will usually help prevent this problem from occurring.  A mild laxative (prune juice, Milk of Magnesia, MiraLax, etc) should be taken according to package directions if there are no bowel movements after 48 hours.    Dressing: Your incisions are covered in Dermabond which is like sterile superglue for the skin. This will come off on it's own in a couple weeks. It is waterproof and you may bathe normally starting the day after your surgery in a shower. Avoid baths/pools/lakes/oceans until your wounds have fully healed.  ACTIVITIES as tolerated:   Avoid heavy lifting (>10lbs or 1 gallon of milk) for the next 6 weeks. You may resume regular daily activities as tolerated--such as daily self-care, walking, climbing stairs--gradually increasing activities as tolerated.  If you can walk 30 minutes without difficulty, it is safe to try more intense activity such as jogging, treadmill, bicycling, low-impact aerobics.  DO NOT PUSH THROUGH PAIN.  Let pain be your guide: If it hurts to do something, don't do it. You may drive when you are no longer taking prescription pain medication, you can comfortably wear a seatbelt, and you can safely maneuver your car and apply brakes.  FOLLOW UP in our   office Please call [facility] at [phone] to set up an appointment to see your surgeon in the office for a follow-up appointment approximately 2 weeks after your surgery. Make sure that you call for this appointment the day you arrive home to insure a convenient appointment time.  9. If you have disability or family leave forms that need to be completed, you may have  them completed by your primary care physician's office; for return to work instructions, please ask our office staff and they will be happy to assist you in obtaining this documentation   When to call us [phone]: Poor pain control Reactions / problems with new medications (rash/itching, etc)  Fever over 101.5 F (38.5 C) Inability to urinate Nausea/vomiting Worsening swelling or bruising Continued bleeding from incision. Increased pain, redness, or drainage from the incision  The clinic staff is available to answer your questions during regular business hours (8:30am-5pm).  Please don't hesitate to call and ask to speak to one of our nurses for clinical concerns.   A surgeon from [facility] is always on call at the hospitals   If you have a medical emergency, go to the nearest emergency room or call 911.  [facility] [address], [address], [place], [place]  [address] MAIN: [phone] FAX: [phone] [email or web address]  

## 2022-07-07 NOTE — Anesthesia Preprocedure Evaluation (Addendum)
Anesthesia Evaluation  Patient identified by MRN, date of birth, ID band Patient awake    Reviewed: Allergy & Precautions, H&P , NPO status , Patient's Chart, lab work & pertinent test results  Airway Mallampati: II  TM Distance: >3 FB Neck ROM: Full    Dental no notable dental hx. (+) Teeth Intact, Dental Advisory Given   Pulmonary shortness of breath   Pulmonary exam normal breath sounds clear to auscultation       Cardiovascular hypertension, Pt. on medications  Rhythm:Regular Rate:Normal     Neuro/Psych negative neurological ROS  negative psych ROS   GI/Hepatic Neg liver ROS,GERD  Medicated,,Colon CA   Endo/Other  diabetes, Type 2, Oral Hypoglycemic Agents    Renal/GU negative Renal ROS  negative genitourinary   Musculoskeletal  (+) Arthritis ,    Abdominal   Peds  Hematology  (+) Blood dyscrasia, anemia   Anesthesia Other Findings   Reproductive/Obstetrics negative OB ROS                             Anesthesia Physical Anesthesia Plan  ASA: 3  Anesthesia Plan: General   Post-op Pain Management: Ofirmev IV (intra-op)* and Lidocaine infusion*   Induction: Intravenous  PONV Risk Score and Plan: 4 or greater and Ondansetron, Dexamethasone and Treatment may vary due to age or medical condition  Airway Management Planned: Oral ETT  Additional Equipment:   Intra-op Plan:   Post-operative Plan: Extubation in OR  Informed Consent: I have reviewed the patients History and Physical, chart, labs and discussed the procedure including the risks, benefits and alternatives for the proposed anesthesia with the patient or authorized representative who has indicated his/her understanding and acceptance.     Dental advisory given  Plan Discussed with: CRNA  Anesthesia Plan Comments:        Anesthesia Quick Evaluation

## 2022-07-07 NOTE — Anesthesia Postprocedure Evaluation (Signed)
Anesthesia Post Note  Patient: Donna Murphy  Procedure(s) Performed: LAPAROSCOPIC RIGHT HEMICOLECTOMY (Right: Abdomen)     Patient location during evaluation: PACU Anesthesia Type: General Level of consciousness: awake and alert Pain management: pain level controlled Vital Signs Assessment: post-procedure vital signs reviewed and stable Respiratory status: spontaneous breathing, nonlabored ventilation, respiratory function stable and patient connected to nasal cannula oxygen Cardiovascular status: blood pressure returned to baseline and stable Postop Assessment: no apparent nausea or vomiting Anesthetic complications: no  No notable events documented.  Last Vitals:  Vitals:   07/07/22 1430 07/07/22 1445  BP: (!) 151/73 (!) 148/75  Pulse: 65 65  Resp: 14 18  Temp:  37 C  SpO2: 100% 100%    Last Pain:  Vitals:   07/07/22 1445  TempSrc: Oral  PainSc:                  Alando Colleran,W. EDMOND

## 2022-07-08 ENCOUNTER — Encounter (HOSPITAL_COMMUNITY): Payer: Self-pay | Admitting: Surgery

## 2022-07-08 LAB — CBC
HCT: 25.3 % — ABNORMAL LOW (ref 36.0–46.0)
Hemoglobin: 7.3 g/dL — ABNORMAL LOW (ref 12.0–15.0)
MCH: 20.2 pg — ABNORMAL LOW (ref 26.0–34.0)
MCHC: 28.9 g/dL — ABNORMAL LOW (ref 30.0–36.0)
MCV: 70.1 fL — ABNORMAL LOW (ref 80.0–100.0)
Platelets: 459 10*3/uL — ABNORMAL HIGH (ref 150–400)
RBC: 3.61 MIL/uL — ABNORMAL LOW (ref 3.87–5.11)
RDW: 19.5 % — ABNORMAL HIGH (ref 11.5–15.5)
WBC: 10.7 10*3/uL — ABNORMAL HIGH (ref 4.0–10.5)
nRBC: 0 % (ref 0.0–0.2)

## 2022-07-08 LAB — BASIC METABOLIC PANEL
Anion gap: 9 (ref 5–15)
BUN: 7 mg/dL — ABNORMAL LOW (ref 8–23)
CO2: 23 mmol/L (ref 22–32)
Calcium: 8.3 mg/dL — ABNORMAL LOW (ref 8.9–10.3)
Chloride: 104 mmol/L (ref 98–111)
Creatinine, Ser: 0.45 mg/dL (ref 0.44–1.00)
GFR, Estimated: >60 mL/min (ref >60)
Glucose, Bld: 179 mg/dL — ABNORMAL HIGH (ref 70–99)
Potassium: 3.1 mmol/L — ABNORMAL LOW (ref 3.5–5.1)
Sodium: 136 mmol/L (ref 135–145)

## 2022-07-08 LAB — GLUCOSE, CAPILLARY
Glucose-Capillary: 176 mg/dL — ABNORMAL HIGH (ref 70–99)
Glucose-Capillary: 183 mg/dL — ABNORMAL HIGH (ref 70–99)
Glucose-Capillary: 199 mg/dL — ABNORMAL HIGH (ref 70–99)
Glucose-Capillary: 150 mg/dL — ABNORMAL HIGH (ref 70–99)

## 2022-07-08 NOTE — Progress Notes (Signed)
1 Day Post-Op  Subjective: Having some pain, controlled.  Had a small BM.  Passing flatus  Objective: Vital signs in last 24 hours: Temp:  [97.5 F (36.4 C)-98.6 F (37 C)] 98.6 F (37 C) (04/06 0408) Pulse Rate:  [63-88] 86 (04/06 0408) Resp:  [14-23] 15 (04/06 0408) BP: (116-185)/(60-78) 116/66 (04/06 0408) SpO2:  [95 %-100 %] 95 % (04/06 0408) Weight:  [53.1 kg-59.1 kg] 59.1 kg (04/06 0500)   Intake/Output from previous day: 04/05 0701 - 04/06 0700 In: 3383.6 [P.O.:240; I.V.:3043.6; IV Piggyback:100] Out: 2145 [Urine:2125; Blood:20] Intake/Output this shift: No intake/output data recorded.   General appearance: alert and cooperative GI: soft, non-distended  Incision: no significant drainage  Lab Results:  Recent Labs    07/08/22 0520  WBC 10.7*  HGB 7.3*  HCT 25.3*  PLT 459*   BMET Recent Labs    07/08/22 0520  NA 136  K 3.1*  CL 104  CO2 23  GLUCOSE 179*  BUN 7*  CREATININE 0.45  CALCIUM 8.3*   PT/INR No results for input(s): "LABPROT", "INR" in the last 72 hours. ABG No results for input(s): "PHART", "HCO3" in the last 72 hours.  Invalid input(s): "PCO2", "PO2"  MEDS, Scheduled  acetaminophen  1,000 mg Oral Q6H   alvimopan  12 mg Oral BID   amLODipine  5 mg Oral Daily   aspirin EC  81 mg Oral Daily   atorvastatin  40 mg Oral Daily   cholecalciferol  2,000 Units Oral Daily   cyanocobalamin  1,000 mcg Oral Daily   famotidine  20 mg Oral QHS   feeding supplement (GLUCERNA SHAKE)  237 mL Oral BID BM   ferrous gluconate  648 mg Oral Q breakfast   folic acid  1 mg Oral Daily   heparin injection (subcutaneous)  5,000 Units Subcutaneous Q8H   insulin aspart  0-5 Units Subcutaneous QHS   insulin aspart  0-9 Units Subcutaneous TID WC   loratadine  10 mg Oral Daily   pantoprazole  40 mg Oral Daily    Studies/Results: No results found.  Assessment: s/p Procedure(s): LAPAROSCOPIC RIGHT HEMICOLECTOMY Patient Active Problem List   Diagnosis  Date Noted   S/P right hemicolectomy 07/07/2022   Vitamin B12 deficiency 05/31/2022   Aortic atherosclerosis 05/31/2022   CAD (coronary artery disease) 05/31/2022   Pre-operative examination for internal medicine 05/31/2022   Primary adenocarcinoma of ascending colon 05/11/2022   Symptomatic anemia 05/05/2022   Microcytic anemia 05/02/2022   Eye pressure 04/14/2022   SOB (shortness of breath) on exertion 04/14/2022   Hyperlipidemia associated with type 2 diabetes mellitus 02/18/2019   Screening mammogram, encounter for 02/18/2019   Hx of colonic polyps 03/01/2016   Encounter for Medicare annual wellness exam 11/12/2013   Rash and nonspecific skin eruption 06/03/2012   Other screening mammogram 05/24/2011   Gynecological examination 05/24/2011   DM (diabetes mellitus), type 2 12/04/2006   Essential hypertension 07/12/2006   VARICOSE VEIN 07/12/2006   ALLERGIC RHINITIS 07/12/2006   GERD 07/12/2006   IRRITABLE BOWEL SYNDROME 07/12/2006    Expected post course Acute BLA and chronic anemia: Hgb 7.3, recheck in AM   Plan: d/c foley Advance diet   LOS: 1 day     .Vanita Panda, MD [facility], [place]    07/08/2022 9:22 AM

## 2022-07-08 NOTE — Evaluation (Signed)
Physical Therapy One Time Evaluation Patient Details Name: Donna Murphy MRN: [id number] DOB: Sep 11, 1945 Today's Date: 07/08/2022  History of Present Illness  Pt is a 77 year old female s/p right hemicolectomy due to ascending colon cancer. PHMx: hypertension, hyperlipidemia, GERD, diabetes, fibroids, iron deficiency anemia  Clinical Impression  Patient evaluated by Physical Therapy with no further acute PT needs identified. All education has been completed and the patient has no further questions.  Pt assisted with ambulating and tolerated good distance in hallway however does reports some mild constant dizziness.  Pt agreeable to use RW and have person assist with ambulating at least 3-4/day to assist in recovery.  Daughter present today and reports assist lined up from family for pt upon d/c since pt lives alone.  Pt educated on log roll technique, bracing pillow for coughing, ambulating, and ice to surgical site to assist with pain control.  PT is signing off. Thank you for this referral.        Recommendations for follow up therapy are one component of a multi-disciplinary discharge planning process, led by the attending physician.  Recommendations may be updated based on patient status, additional functional criteria and insurance authorization.  Follow Up Recommendations       Assistance Recommended at Discharge PRN  Patient can return home with the following       Equipment Recommendations None recommended by PT  Recommendations for Other Services       Functional Status Assessment Patient has not had a recent decline in their functional status     Precautions / Restrictions Precautions Precautions: Fall      Mobility  Bed Mobility               General bed mobility comments: pt sitting EOB on arrival, educated on log roll technique for pain control    Transfers Overall transfer level: Needs assistance Equipment used: Rolling walker (2 wheels) Transfers: Sit  to/from Stand Sit to Stand: Min guard, Supervision           General transfer comment: cues for hand placement    Ambulation/Gait Ambulation/Gait assistance: Min guard, Supervision Gait Distance (Feet): 220 Feet Assistive device: Rolling walker (2 wheels) Gait Pattern/deviations: Step-through pattern, Decreased stride length       General Gait Details: pt reports mild constant dizziness however preferred to continue so close guard provided for safety (Hgb 7.3), RN notified  Stairs            Wheelchair Mobility    Modified Rankin (Stroke Patients Only)       Balance                                             Pertinent Vitals/Pain Pain Assessment Pain Assessment: Faces Faces Pain Scale: Hurts little more Pain Location: surgical site Pain Descriptors / Indicators: Tender, Sore Pain Intervention(s): Repositioned, Ice applied    Home Living Family/patient expects to be discharged to:: Private residence Living Arrangements: Alone Available Help at Discharge: Family;Available 24 hours/day (family planning to have assist for pt at home upon d/c.) Type of Home: House Home Access: Stairs to enter Entrance Stairs-Rails: Right;Left;Can reach both Entrance Stairs-Number of Steps: 4   Home Layout: Able to live on main level with bedroom/bathroom;Two level Home Equipment: Agricultural consultant (2 wheels)      Prior Function Prior Level of Function : Independent/Modified  Independent                     Hand Dominance        Extremity/Trunk Assessment        Lower Extremity Assessment Lower Extremity Assessment: Overall WFL for tasks assessed       Communication   Communication: No difficulties  Cognition Arousal/Alertness: Awake/alert Behavior During Therapy: WFL for tasks assessed/performed Overall Cognitive Status: Within Functional Limits for tasks assessed                                          General  Comments      Exercises     Assessment/Plan    PT Assessment Patient does not need any further PT services  PT Problem List         PT Treatment Interventions      PT Goals (Current goals can be found in the Care Plan section)  Acute Rehab PT Goals PT Goal Formulation: All assessment and education complete, DC therapy    Frequency       Co-evaluation               AM-PAC PT "6 Clicks" Mobility  Outcome Measure Help needed turning from your back to your side while in a flat bed without using bedrails?: A Little Help needed moving from lying on your back to sitting on the side of a flat bed without using bedrails?: A Little Help needed moving to and from a bed to a chair (including a wheelchair)?: A Little Help needed standing up from a chair using your arms (e.g., wheelchair or bedside chair)?: A Little Help needed to walk in hospital room?: A Little Help needed climbing 3-5 steps with a railing? : A Little 6 Click Score: 18    End of Session Equipment Utilized During Treatment: Gait belt Activity Tolerance: Patient tolerated treatment well Patient left: in chair;with call bell/phone within reach;with family/visitor present Nurse Communication: Mobility status PT Visit Diagnosis: Difficulty in walking, not elsewhere classified (R26.2)    Time: 1191-4782 PT Time Calculation (min) (ACUTE ONLY): 13 min   Charges:   PT Evaluation $PT Eval Low Complexity: 1 Low         Kati PT, DPT Physical Therapist Acute Rehabilitation Services Office: [phone]   Kati L Payson 07/08/2022, 1:01 PM

## 2022-07-09 LAB — GLUCOSE, CAPILLARY
Glucose-Capillary: 183 mg/dL — ABNORMAL HIGH (ref 70–99)
Glucose-Capillary: 176 mg/dL — ABNORMAL HIGH (ref 70–99)
Glucose-Capillary: 229 mg/dL — ABNORMAL HIGH (ref 70–99)
Glucose-Capillary: 216 mg/dL — ABNORMAL HIGH (ref 70–99)

## 2022-07-09 LAB — CBC
HCT: 27.7 % — ABNORMAL LOW (ref 36.0–46.0)
Hemoglobin: 7.6 g/dL — ABNORMAL LOW (ref 12.0–15.0)
MCH: 19.4 pg — ABNORMAL LOW (ref 26.0–34.0)
MCHC: 27.4 g/dL — ABNORMAL LOW (ref 30.0–36.0)
MCV: 70.8 fL — ABNORMAL LOW (ref 80.0–100.0)
Platelets: 488 10*3/uL — ABNORMAL HIGH (ref 150–400)
RBC: 3.91 MIL/uL (ref 3.87–5.11)
RDW: 19.7 % — ABNORMAL HIGH (ref 11.5–15.5)
WBC: 6.5 10*3/uL (ref 4.0–10.5)
nRBC: 0 % (ref 0.0–0.2)

## 2022-07-09 LAB — BASIC METABOLIC PANEL
Anion gap: 7 (ref 5–15)
BUN: 6 mg/dL — ABNORMAL LOW (ref 8–23)
CO2: 24 mmol/L (ref 22–32)
Calcium: 8.4 mg/dL — ABNORMAL LOW (ref 8.9–10.3)
Chloride: 105 mmol/L (ref 98–111)
Creatinine, Ser: 0.54 mg/dL (ref 0.44–1.00)
GFR, Estimated: >60 mL/min (ref >60)
Glucose, Bld: 186 mg/dL — ABNORMAL HIGH (ref 70–99)
Potassium: 3.1 mmol/L — ABNORMAL LOW (ref 3.5–5.1)
Sodium: 136 mmol/L (ref 135–145)

## 2022-07-09 NOTE — Progress Notes (Signed)
2 Days Post-Op  Subjective: Having less pain, controlled.  Having BMs, tolerating fulls  Objective: Vital signs in last 24 hours: Temp:  [97.5 F (36.4 C)-98.7 F (37.1 C)] 98.5 F (36.9 C) (04/07 0550) Pulse Rate:  [71-79] 79 (04/07 0550) Resp:  [14-18] 18 (04/07 0550) BP: (123-165)/(68-86) 165/86 (04/07 0550) SpO2:  [97 %-99 %] 97 % (04/07 0550)   Intake/Output from previous day: 04/06 0701 - 04/07 0700 In: 1491.6 [P.O.:1440; I.V.:51.6] Out: -  Intake/Output this shift: Total I/O In: 120 [P.O.:120] Out: -    General appearance: alert and cooperative GI: soft, non-distended  Incision: no significant drainage  Lab Results:  Recent Labs    07/08/22 0520 07/09/22 0426  WBC 10.7* 6.5  HGB 7.3* 7.6*  HCT 25.3* 27.7*  PLT 459* 488*    BMET Recent Labs    07/08/22 0520 07/09/22 0426  NA 136 136  K 3.1* 3.1*  CL 104 105  CO2 23 24  GLUCOSE 179* 186*  BUN 7* 6*  CREATININE 0.45 0.54  CALCIUM 8.3* 8.4*    PT/INR No results for input(s): "LABPROT", "INR" in the last 72 hours. ABG No results for input(s): "PHART", "HCO3" in the last 72 hours.  Invalid input(s): "PCO2", "PO2"  MEDS, Scheduled  acetaminophen  1,000 mg Oral Q6H   amLODipine  5 mg Oral Daily   aspirin EC  81 mg Oral Daily   atorvastatin  40 mg Oral Daily   cholecalciferol  2,000 Units Oral Daily   cyanocobalamin  1,000 mcg Oral Daily   famotidine  20 mg Oral QHS   feeding supplement (GLUCERNA SHAKE)  237 mL Oral BID BM   ferrous gluconate  648 mg Oral Q breakfast   folic acid  1 mg Oral Daily   heparin injection (subcutaneous)  5,000 Units Subcutaneous Q8H   insulin aspart  0-5 Units Subcutaneous QHS   insulin aspart  0-9 Units Subcutaneous TID WC   loratadine  10 mg Oral Daily   pantoprazole  40 mg Oral Daily    Studies/Results: No results found.  Assessment: s/p Procedure(s): LAPAROSCOPIC RIGHT HEMICOLECTOMY Patient Active Problem List   Diagnosis Date Noted   S/P right  hemicolectomy 07/07/2022   Vitamin B12 deficiency 05/31/2022   Aortic atherosclerosis 05/31/2022   CAD (coronary artery disease) 05/31/2022   Pre-operative examination for internal medicine 05/31/2022   Primary adenocarcinoma of ascending colon 05/11/2022   Symptomatic anemia 05/05/2022   Microcytic anemia 05/02/2022   Eye pressure 04/14/2022   SOB (shortness of breath) on exertion 04/14/2022   Hyperlipidemia associated with type 2 diabetes mellitus 02/18/2019   Screening mammogram, encounter for 02/18/2019   Hx of colonic polyps 03/01/2016   Encounter for Medicare annual wellness exam 11/12/2013   Rash and nonspecific skin eruption 06/03/2012   Other screening mammogram 05/24/2011   Gynecological examination 05/24/2011   DM (diabetes mellitus), type 2 12/04/2006   Essential hypertension 07/12/2006   VARICOSE VEIN 07/12/2006   ALLERGIC RHINITIS 07/12/2006   GERD 07/12/2006   IRRITABLE BOWEL SYNDROME 07/12/2006    Expected post course Acute BLA and chronic anemia: Hgb stable   Plan: D/c tom Advance diet   LOS: 2 days     .Vanita Panda, MD [facility], [place]    07/09/2022 8:48 AM

## 2022-07-09 NOTE — Progress Notes (Signed)
Pharmacy Brief Note - Alvimopan (Entereg)  The standing order set for alvimopan (Entereg) now includes an automatic order to discontinue the drug after the patient has had a bowel movement. The change was approved by the Pharmacy & Therapeutics Committee and the Medical Executive Committee.   This patient has had bowel movements documented by nursing. Therefore, alvimopan has been discontinued. If there are questions, please contact the pharmacy at [phone].   Thank you-  Dorna Leitz, PharmD, BCPS 07/09/2022 8:33 AM

## 2022-07-10 ENCOUNTER — Encounter: Payer: Self-pay | Admitting: *Deleted

## 2022-07-10 LAB — SURGICAL PATHOLOGY

## 2022-07-10 LAB — GLUCOSE, CAPILLARY: Glucose-Capillary: 205 mg/dL — ABNORMAL HIGH (ref 70–99)

## 2022-07-10 NOTE — TOC CM/SW Note (Signed)
Transition of Care [facility]) Screening Note  Patient Details  Name: Donna Murphy Date of Birth: 03-Mar-1946  Transition of Care [facility]) CM/SW Contact:    Ewing Schlein, LCSW Phone Number: 07/10/2022, 9:05 AM  Transition of Care Department [facility]) has reviewed patient and no TOC needs have been identified at this time. We will continue to monitor patient advancement through interdisciplinary progression rounds. If new patient transition needs arise, please place a TOC consult.

## 2022-07-10 NOTE — Plan of Care (Signed)
  Problem: Education: Goal: Understanding of discharge needs will improve Outcome: Adequate for Discharge Goal: Verbalization of understanding of the causes of altered bowel function will improve Outcome: Adequate for Discharge   Problem: Activity: Goal: Ability to tolerate increased activity will improve Outcome: Adequate for Discharge   Problem: Bowel/Gastric: Goal: Gastrointestinal status for postoperative course will improve Outcome: Adequate for Discharge   Problem: Health Behavior/Discharge Planning: Goal: Identification of community resources to assist with postoperative recovery needs will improve Outcome: Adequate for Discharge   Problem: Nutritional: Goal: Will attain and maintain optimal nutritional status will improve Outcome: Adequate for Discharge   Problem: Clinical Measurements: Goal: Postoperative complications will be avoided or minimized Outcome: Adequate for Discharge   Problem: Respiratory: Goal: Respiratory status will improve Outcome: Adequate for Discharge   Problem: Skin Integrity: Goal: Will show signs of wound healing Outcome: Adequate for Discharge   Problem: Education: Goal: Ability to describe self-care measures that may prevent or decrease complications (Diabetes Survival Skills Education) will improve Outcome: Adequate for Discharge Goal: Individualized Educational Video(s) Outcome: Adequate for Discharge   Problem: Coping: Goal: Ability to adjust to condition or change in health will improve Outcome: Adequate for Discharge   Problem: Fluid Volume: Goal: Ability to maintain a balanced intake and output will improve Outcome: Adequate for Discharge   Problem: Health Behavior/Discharge Planning: Goal: Ability to identify and utilize available resources and services will improve Outcome: Adequate for Discharge Goal: Ability to manage health-related needs will improve Outcome: Adequate for Discharge   Problem: Metabolic: Goal: Ability  to maintain appropriate glucose levels will improve Outcome: Adequate for Discharge   Problem: Nutritional: Goal: Maintenance of adequate nutrition will improve Outcome: Adequate for Discharge Goal: Progress toward achieving an optimal weight will improve Outcome: Adequate for Discharge   Problem: Skin Integrity: Goal: Risk for impaired skin integrity will decrease Outcome: Adequate for Discharge   Problem: Tissue Perfusion: Goal: Adequacy of tissue perfusion will improve Outcome: Adequate for Discharge   Problem: Education: Goal: Knowledge of General Education information will improve Description: Including pain rating scale, medication(s)/side effects and non-pharmacologic comfort measures Outcome: Adequate for Discharge   Problem: Health Behavior/Discharge Planning: Goal: Ability to manage health-related needs will improve Outcome: Adequate for Discharge   Problem: Clinical Measurements: Goal: Ability to maintain clinical measurements within normal limits will improve Outcome: Adequate for Discharge Goal: Will remain free from infection Outcome: Adequate for Discharge Goal: Diagnostic test results will improve Outcome: Adequate for Discharge Goal: Respiratory complications will improve Outcome: Adequate for Discharge Goal: Cardiovascular complication will be avoided Outcome: Adequate for Discharge   Problem: Activity: Goal: Risk for activity intolerance will decrease Outcome: Adequate for Discharge   Problem: Nutrition: Goal: Adequate nutrition will be maintained Outcome: Adequate for Discharge   Problem: Coping: Goal: Level of anxiety will decrease Outcome: Adequate for Discharge   Problem: Elimination: Goal: Will not experience complications related to bowel motility Outcome: Adequate for Discharge Goal: Will not experience complications related to urinary retention Outcome: Adequate for Discharge   Problem: Pain Managment: Goal: General experience of  comfort will improve Outcome: Adequate for Discharge   Problem: Safety: Goal: Ability to remain free from injury will improve Outcome: Adequate for Discharge   Problem: Skin Integrity: Goal: Risk for impaired skin integrity will decrease Outcome: Adequate for Discharge   

## 2022-07-10 NOTE — Progress Notes (Signed)
Pt was discharged home today. Instructions were reviewed with patient, and questions were answered. Pt was taken to main entrance via wheelchair by NT.  

## 2022-07-10 NOTE — Progress Notes (Signed)
Oncology Discharge Planning Note  [facility] Address: [address], [place], [place] [address] Hours of Operation:  Lewayne Bunting, Monday - Friday  Clinic Contact Information:  [phone]) [phone]  Oncology Care Team: Medical Oncologist:  Truett Perna  Patient Details: Name:  Donna Murphy, Donna Murphy MRN:   [id number] DOB:   08-04-1945 Reason for Current Admission: @PPROB @  Discharge Planning Narrative: Notification of admission received by Dr Cliffton Asters and inpatient team for Donna Murphy.  Discharge follow-up appointments for oncology are current and available on the AVS and MyChart.   Upon discharge from the hospital, hematology/oncology's post discharge plan of care for the outpatient setting is:   Established Patient Office Visit  Subjective   Patient ID: Donna Murphy, female    DOB: 03-23-1946  Age: 77 y.o. MRN: [id number]  No chief complaint on file.   HPI    ROS    Objective:     There were no vitals taken for this visit.   Physical Exam   Results for orders placed or performed during the hospital encounter of 07/07/22  Glucose, capillary  Result Value Ref Range   Glucose-Capillary 236 (H) 70 - 99 mg/dL  Glucose, capillary  Result Value Ref Range   Glucose-Capillary 178 (H) 70 - 99 mg/dL  Glucose, capillary  Result Value Ref Range   Glucose-Capillary 182 (H) 70 - 99 mg/dL   Comment 1 Notify RN   Glucose, capillary  Result Value Ref Range   Glucose-Capillary 223 (H) 70 - 99 mg/dL  CBC  Result Value Ref Range   WBC 10.7 (H) 4.0 - 10.5 K/uL   RBC 3.61 (L) 3.87 - 5.11 MIL/uL   Hemoglobin 7.3 (L) 12.0 - 15.0 g/dL   HCT 58.3 (L) 09.4 - 07.6 %   MCV 70.1 (L) 80.0 - 100.0 fL   MCH 20.2 (L) 26.0 - 34.0 pg   MCHC 28.9 (L) 30.0 - 36.0 g/dL   RDW 80.8 (H) 81.1 - 03.1 %   Platelets 459 (H) 150 - 400 K/uL   nRBC 0.0 0.0 - 0.2 %  Basic metabolic panel  Result Value Ref Range   Sodium 136 135 - 145 mmol/L   Potassium 3.1 (L) 3.5 - 5.1 mmol/L    Chloride 104 98 - 111 mmol/L   CO2 23 22 - 32 mmol/L   Glucose, Bld 179 (H) 70 - 99 mg/dL   BUN 7 (L) 8 - 23 mg/dL   Creatinine, Ser 5.94 0.44 - 1.00 mg/dL   Calcium 8.3 (L) 8.9 - 10.3 mg/dL   GFR, Estimated >58 >59 mL/min   Anion gap 9 5 - 15  Glucose, capillary  Result Value Ref Range   Glucose-Capillary 254 (H) 70 - 99 mg/dL  Glucose, capillary  Result Value Ref Range   Glucose-Capillary 176 (H) 70 - 99 mg/dL  Glucose, capillary  Result Value Ref Range   Glucose-Capillary 183 (H) 70 - 99 mg/dL  Glucose, capillary  Result Value Ref Range   Glucose-Capillary 199 (H) 70 - 99 mg/dL  CBC  Result Value Ref Range   WBC 6.5 4.0 - 10.5 K/uL   RBC 3.91 3.87 - 5.11 MIL/uL   Hemoglobin 7.6 (L) 12.0 - 15.0 g/dL   HCT 29.2 (L) 44.6 - 28.6 %   MCV 70.8 (L) 80.0 - 100.0 fL   MCH 19.4 (L) 26.0 - 34.0 pg   MCHC 27.4 (L) 30.0 - 36.0 g/dL   RDW 38.1 (H) 77.1 - 16.5 %  Platelets 488 (H) 150 - 400 K/uL   nRBC 0.0 0.0 - 0.2 %  Basic metabolic panel  Result Value Ref Range   Sodium 136 135 - 145 mmol/L   Potassium 3.1 (L) 3.5 - 5.1 mmol/L   Chloride 105 98 - 111 mmol/L   CO2 24 22 - 32 mmol/L   Glucose, Bld 186 (H) 70 - 99 mg/dL   BUN 6 (L) 8 - 23 mg/dL   Creatinine, Ser 3.33 0.44 - 1.00 mg/dL   Calcium 8.4 (L) 8.9 - 10.3 mg/dL   GFR, Estimated >83 >29 mL/min   Anion gap 7 5 - 15  Glucose, capillary  Result Value Ref Range   Glucose-Capillary 150 (H) 70 - 99 mg/dL  Glucose, capillary  Result Value Ref Range   Glucose-Capillary 183 (H) 70 - 99 mg/dL  Glucose, capillary  Result Value Ref Range   Glucose-Capillary 176 (H) 70 - 99 mg/dL  Glucose, capillary  Result Value Ref Range   Glucose-Capillary 229 (H) 70 - 99 mg/dL  Glucose, capillary  Result Value Ref Range   Glucose-Capillary 216 (H) 70 - 99 mg/dL  Glucose, capillary  Result Value Ref Range   Glucose-Capillary 205 (H) 70 - 99 mg/dL  Type and screen [facility]  Result Value Ref Range   ABO/RH(D) A  NEG    Antibody Screen NEG    Sample Expiration      07/10/2022,2359 Performed at [facility], [address] [place], [place] [address]       The 10-year ASCVD risk score (Arnett DK, et al., 2019) is: 45.4%    Assessment & Plan:   Problem List Items Addressed This Visit   None   No follow-ups on file.Oncology Discharge Planning Note  [facility] Address: [address], [place], [place] [address] Hours of Operation:  Lewayne Bunting, Monday - Friday  Clinic Contact Information:  [phone]) [phone]  Oncology Care Team: Medical Oncologist:  Truett Perna  Patient Details: Name:  Donna Murphy, Donna Murphy MRN:   [id number] DOB:   04-Mar-1946 Reason for Current Admission: @PPROB @  Discharge Planning Narrative: Notification of admission received by Dr Cliffton Asters and inpatient team for Donna Murphy.  Discharge follow-up appointments for oncology are current and available on the AVS and MyChart.   Upon discharge from the hospital, hematology/oncology's post discharge plan of care for the outpatient setting is: April 23,2024 at 10:40am [facility] [address] [place], [place] [address]  [phone]   Parmis Farag will be called within two business days after discharge to review hematology/oncology's plan of care for full understanding.    Outpatient Oncology Specific Care Only: Oncology appointment transportation needs addressed?:  no Oncology medication management for symptom management addressed?:  yes Chemo Alert Card reviewed?:  no Immunotherapy Alert Card reviewed?:  yes   Kris Hartmann Pasty Spillers, RN    Donna Murphy will be called within two business days after discharge to review hematology/oncology's plan of care for full understanding.    Outpatient Oncology Specific Care Only: Oncology appointment transportation needs addressed?:  no Oncology medication management for symptom management addressed?:   no Chemo Alert Card reviewed?:  not applicable Immunotherapy Alert Card reviewed?:  not applicable

## 2022-07-10 NOTE — Progress Notes (Signed)
Subjective No acute events. Feeling well. No complaints at present. Ambulating multiple laps on her own. No n/v. Tolerating solid foods without trouble. Having flatus and BM.  Objective: Vital signs in last 24 hours: Temp:  [98 F (36.7 C)-98.5 F (36.9 C)] 98.5 F (36.9 C) (04/08 0548) Pulse Rate:  [79-84] 84 (04/08 0548) Resp:  [16-18] 18 (04/08 0548) BP: (144-160)/(80-85) 149/85 (04/08 0548) SpO2:  [97 %-99 %] 97 % (04/08 0548) Weight:  [56.2 kg] 56.2 kg (04/08 0500) Last BM Date : 07/09/22  Intake/Output from previous day: 04/07 0701 - 04/08 0700 In: 1560 [P.O.:1560] Out: 1700 [Urine:1700] Intake/Output this shift: No intake/output data recorded.  Gen: NAD, comfortable CV: RRR Pulm: Normal work of breathing Abd: Soft, NT/ND. Incisions c/d/I without erythema nor drainage Ext: SCDs in place  Lab Results: CBC  Recent Labs    07/08/22 0520 07/09/22 0426  WBC 10.7* 6.5  HGB 7.3* 7.6*  HCT 25.3* 27.7*  PLT 459* 488*   BMET Recent Labs    07/08/22 0520 07/09/22 0426  NA 136 136  K 3.1* 3.1*  CL 104 105  CO2 23 24  GLUCOSE 179* 186*  BUN 7* 6*  CREATININE 0.45 0.54  CALCIUM 8.3* 8.4*   PT/INR No results for input(s): "LABPROT", "INR" in the last 72 hours. ABG No results for input(s): "PHART", "HCO3" in the last 72 hours.  Invalid input(s): "PCO2", "PO2"  Studies/Results:  Anti-infectives: Anti-infectives (From admission, onward)    Start     Dose/Rate Route Frequency Ordered Stop   07/07/22 1400  neomycin (MYCIFRADIN) tablet 1,000 mg  Status:  Discontinued       See Hyperspace for full Linked Orders Report.   1,000 mg Oral 3 times per day 07/07/22 1015 07/07/22 1027   07/07/22 1400  metroNIDAZOLE (FLAGYL) tablet 1,000 mg  Status:  Discontinued       See Hyperspace for full Linked Orders Report.   1,000 mg Oral 3 times per day 07/07/22 1015 07/07/22 1027   07/07/22 1030  cefoTEtan (CEFOTAN) 2 g in sodium chloride 0.9 % 100 mL IVPB        2 g 200  mL/hr over 30 Minutes Intravenous On call to O.R. 07/07/22 1015 07/07/22 1145        Assessment/Plan: Patient Active Problem List   Diagnosis Date Noted   S/P right hemicolectomy 07/07/2022   Vitamin B12 deficiency 05/31/2022   Aortic atherosclerosis 05/31/2022   CAD (coronary artery disease) 05/31/2022   Pre-operative examination for internal medicine 05/31/2022   Primary adenocarcinoma of ascending colon 05/11/2022   Symptomatic anemia 05/05/2022   Microcytic anemia 05/02/2022   Eye pressure 04/14/2022   SOB (shortness of breath) on exertion 04/14/2022   Hyperlipidemia associated with type 2 diabetes mellitus 02/18/2019   Screening mammogram, encounter for 02/18/2019   Hx of colonic polyps 03/01/2016   Encounter for Medicare annual wellness exam 11/12/2013   Rash and nonspecific skin eruption 06/03/2012   Other screening mammogram 05/24/2011   Gynecological examination 05/24/2011   DM (diabetes mellitus), type 2 12/04/2006   Essential hypertension 07/12/2006   VARICOSE VEIN 07/12/2006   ALLERGIC RHINITIS 07/12/2006   GERD 07/12/2006   IRRITABLE BOWEL SYNDROME 07/12/2006   s/p Procedure(s): LAPAROSCOPIC RIGHT HEMICOLECTOMY 07/07/2022  -Doing exceptionally well -Pathology pending -Discussed procedure, findings and plans -Continue outpatient iron until discontinued by primary/oncology -She is comfortable with and stable for discharge home -Follow-up and general expectations reviewed.   LOS: 3 days    Marin Olp,  MD [facility], A [facility]

## 2022-07-10 NOTE — Care Management Important Message (Signed)
Important Message  Patient Details IM Letter mailed due to Discharge. Name: LASHAN NELLUMS MRN: [id number] Date of Birth: [birth year]   Medicare Important Message Given:       Caren Macadam 07/10/2022, 2:01 PM

## 2022-07-11 ENCOUNTER — Telehealth: Payer: Self-pay | Admitting: *Deleted

## 2022-07-11 NOTE — Transitions of Care (Post Inpatient/ED Visit) (Signed)
   07/11/2022  Name: ANIHYA FLORKOWSKI MRN: [id number] DOB: [birth year]  Today's TOC FU Call Status: Today's TOC FU Call Status:: Successful TOC FU Call Competed TOC FU Call Complete Date: 07/11/22  Transition Care Management Follow-up Telephone Call Date of Discharge: 07/10/22 Discharge Facility: [facility]) Type of Discharge: Inpatient Admission Primary Inpatient Discharge Diagnosis:: S/P right hemicolectomy How have you been since you were released from the hospital?: Better Any questions or concerns?: No  Items Reviewed: Did you receive and understand the discharge instructions provided?: Yes Medications obtained and verified?: Yes (Medications Reviewed) Any new allergies since your discharge?: No Dietary orders reviewed?: No Do you have support at home?: Yes People in Home: alone Name of Support/Comfort Primary Source: Tresa Endo, tom and Michael children  Home Care and Equipment/Supplies: Were Home Health Services Ordered?: No Any new equipment or medical supplies ordered?: No  Functional Questionnaire: Do you need assistance with bathing/showering or dressing?: No Do you need assistance with meal preparation?: No Do you need assistance with eating?: No Do you have difficulty maintaining continence: No Do you need assistance with getting out of bed/getting out of a chair/moving?: No Do you have difficulty managing or taking your medications?: No  Follow up appointments reviewed: PCP Follow-up appointment confirmed?: NA Specialist Hospital Follow-up appointment confirmed?: Yes Date of Specialist follow-up appointment?: 07/25/22 Follow-Up Specialty Provider:: [id number] Dr Truett Perna 10:40, [id number] Dr Cliffton Asters 2:50 Do you need transportation to your follow-up appointment?: No Do you understand care options if your condition(s) worsen?: Yes-patient verbalized understanding  SDOH Interventions Today    Flowsheet Row Most Recent Value  SDOH Interventions   Food Insecurity  Interventions Intervention Not Indicated  Housing Interventions Intervention Not Indicated  Transportation Interventions Intervention Not Indicated      Interventions Today    Flowsheet Row Most Recent Value  General Interventions   General Interventions Discussed/Reviewed General Interventions Discussed, General Interventions Reviewed, Doctor Visits  Doctor Visits Discussed/Reviewed Doctor Visits Discussed, Doctor Visits Reviewed, Specialist  PCP/Specialist Visits Compliance with follow-up visit         Gean Maidens BSN RN [facility] [phone]

## 2022-07-11 NOTE — Discharge Summary (Signed)
Patient ID: Donna Murphy MRN: [id number] DOB/AGE: 1945-11-13 77 y.o.  Admit date: 07/07/2022 Discharge date: 07/10/2022  Discharge Diagnoses Patient Active Problem List   Diagnosis Date Noted   S/P right hemicolectomy 07/07/2022   Vitamin B12 deficiency 05/31/2022   Aortic atherosclerosis 05/31/2022   CAD (coronary artery disease) 05/31/2022   Pre-operative examination for internal medicine 05/31/2022   Primary adenocarcinoma of ascending colon 05/11/2022   Symptomatic anemia 05/05/2022   Microcytic anemia 05/02/2022   Eye pressure 04/14/2022   SOB (shortness of breath) on exertion 04/14/2022   Hyperlipidemia associated with type 2 diabetes mellitus 02/18/2019   Screening mammogram, encounter for 02/18/2019   Hx of colonic polyps 03/01/2016   Encounter for Medicare annual wellness exam 11/12/2013   Rash and nonspecific skin eruption 06/03/2012   Other screening mammogram 05/24/2011   Gynecological examination 05/24/2011   DM (diabetes mellitus), type 2 12/04/2006   Essential hypertension 07/12/2006   VARICOSE VEIN 07/12/2006   ALLERGIC RHINITIS 07/12/2006   GERD 07/12/2006   IRRITABLE BOWEL SYNDROME 07/12/2006    Consultants None  Procedures OR 07/07/22 - Laparoscopic right hemicolectomy, partial omentectomy  Hospital Course: She was admitted postoperatively and recovered well.  Her diet was gradually advanced which she tolerated without difficulty.  She began having spontaneous bowel function.  She was mobilizing very well on her own.  On 07/10/2022, she is noted to be tolerating regular diet, having spontaneous bowel function, and pain well-controlled.  She is comfortable with and stable for discharge home.  Postoperative expectations and questions were reviewed.  She expressed understanding of all the above.  Follow-up in our office has also been arranged.  She has been encouraged to call with any questions or concerns.    Allergies as of 07/10/2022       Reactions    Ciprofloxacin    Arm numbness   Sulfonamide Derivatives Hives, Rash        Medication List     TAKE these medications    amLODipine 5 MG tablet Commonly known as: NORVASC TAKE 1 TABLET (5 MG TOTAL) BY MOUTH DAILY.   aspirin 81 MG tablet Take 81 mg by mouth daily.   atorvastatin 40 MG tablet Commonly known as: LIPITOR TAKE 1 TABLET BY MOUTH EVERY DAY   cetirizine 10 MG tablet Commonly known as: ZYRTEC Take 10 mg by mouth daily.   cyanocobalamin 1000 MCG tablet Commonly known as: VITAMIN B12 Take 1 tablet (1,000 mcg total) by mouth daily.   famotidine 20 MG tablet Commonly known as: PEPCID TAKE 1 TABLET BY MOUTH EVERYDAY AT BEDTIME   Farxiga 10 MG Tabs tablet Generic drug: dapagliflozin propanediol Take 10 mg by mouth daily.   ferrous gluconate 324 MG tablet Commonly known as: FERGON Take 648 mg by mouth daily with breakfast.   folic acid 1 MG tablet Commonly known as: FOLVITE Take 1 tablet (1 mg total) by mouth daily.   glucose blood test strip Commonly known as: OneTouch Verio Ck blood sugar once daily and as directed. Dx E11.9   Januvia 100 MG tablet Generic drug: sitaGLIPtin Take 100 mg by mouth daily.   Krill Oil 300 MG Caps Take 300 mg by mouth daily.   metFORMIN 500 MG 24 hr tablet Commonly known as: GLUCOPHAGE-XR Take 1,000 mg by mouth 2 (two) times daily.   multivitamin tablet Take 1 tablet by mouth daily.   omeprazole 40 MG capsule Commonly known as: PRILOSEC Take 40 mg by mouth daily.   psyllium 95 %  Pack Commonly known as: HYDROCIL/METAMUCIL Take 1 packet by mouth daily.   traMADol 50 MG tablet Commonly known as: Ultram Take 1 tablet (50 mg total) by mouth every 6 (six) hours as needed for up to 5 days (postop pain not controlled with tylenol/ibuprofen first).   Vitamin D 50 MCG (2000 UT) tablet Take 2,000 Units by mouth daily.          Follow-up Information     Andria Meuse, MD Follow up on 07/31/2022.    Specialties: General Surgery, Colon and Rectal Surgery Why: Please arrive @ 2:40 pm Contact information: [address] [place] [address] [phone]                 Stephanie Coup. Cliffton Asters, M.D. [facility]

## 2022-07-19 ENCOUNTER — Other Ambulatory Visit: Payer: Self-pay | Admitting: *Deleted

## 2022-07-19 NOTE — Progress Notes (Signed)
The proposed treatment discussed in conference is for discussion purpose only and is not a binding recommendation.  The patients have not been physically examined, or presented with their treatment options.  Therefore, final treatment plans cannot be decided.  

## 2022-07-25 ENCOUNTER — Other Ambulatory Visit: Payer: Self-pay

## 2022-07-25 ENCOUNTER — Encounter: Payer: Self-pay | Admitting: *Deleted

## 2022-07-25 ENCOUNTER — Inpatient Hospital Stay: Payer: Medicare Other | Attending: Oncology | Admitting: Oncology

## 2022-07-25 ENCOUNTER — Inpatient Hospital Stay: Payer: Medicare Other

## 2022-07-25 VITALS — BP 150/70 | HR 79 | Temp 98.1°F | Resp 20 | Ht 63.0 in | Wt 115.8 lb

## 2022-07-25 DIAGNOSIS — D509 Iron deficiency anemia, unspecified: Secondary | ICD-10-CM | POA: Insufficient documentation

## 2022-07-25 DIAGNOSIS — C182 Malignant neoplasm of ascending colon: Secondary | ICD-10-CM | POA: Diagnosis not present

## 2022-07-25 DIAGNOSIS — E119 Type 2 diabetes mellitus without complications: Secondary | ICD-10-CM | POA: Insufficient documentation

## 2022-07-25 DIAGNOSIS — K589 Irritable bowel syndrome without diarrhea: Secondary | ICD-10-CM | POA: Insufficient documentation

## 2022-07-25 DIAGNOSIS — K219 Gastro-esophageal reflux disease without esophagitis: Secondary | ICD-10-CM | POA: Insufficient documentation

## 2022-07-25 DIAGNOSIS — I10 Essential (primary) hypertension: Secondary | ICD-10-CM | POA: Diagnosis not present

## 2022-07-25 LAB — CBC WITH DIFFERENTIAL (CANCER CENTER ONLY)
Abs Immature Granulocytes: 0.02 10*3/uL (ref 0.00–0.07)
Basophils Absolute: 0.1 10*3/uL (ref 0.0–0.1)
Basophils Relative: 1 %
Eosinophils Absolute: 0.1 10*3/uL (ref 0.0–0.5)
Eosinophils Relative: 2 %
HCT: 31.5 % — ABNORMAL LOW (ref 36.0–46.0)
Hemoglobin: 8.8 g/dL — ABNORMAL LOW (ref 12.0–15.0)
Immature Granulocytes: 0 %
Lymphocytes Relative: 24 %
Lymphs Abs: 1.7 10*3/uL (ref 0.7–4.0)
MCH: 20.3 pg — ABNORMAL LOW (ref 26.0–34.0)
MCHC: 27.9 g/dL — ABNORMAL LOW (ref 30.0–36.0)
MCV: 72.7 fL — ABNORMAL LOW (ref 80.0–100.0)
Monocytes Absolute: 0.4 10*3/uL (ref 0.1–1.0)
Monocytes Relative: 6 %
Neutro Abs: 4.8 10*3/uL (ref 1.7–7.7)
Neutrophils Relative %: 67 %
Platelet Count: 579 10*3/uL — ABNORMAL HIGH (ref 150–400)
RBC: 4.33 MIL/uL (ref 3.87–5.11)
RDW: 21.2 % — ABNORMAL HIGH (ref 11.5–15.5)
WBC Count: 7.1 10*3/uL (ref 4.0–10.5)
nRBC: 0 % (ref 0.0–0.2)

## 2022-07-25 LAB — FERRITIN: Ferritin: 4 ng/mL — ABNORMAL LOW (ref 11–307)

## 2022-07-25 NOTE — Progress Notes (Signed)
Request sent to pathology for IHC MMR and MSI testing on accession number [id number]

## 2022-07-25 NOTE — Progress Notes (Addendum)
[facility] OFFICE PROGRESS NOTE   Diagnosis: Colon cancer  INTERVAL HISTORY:   Ms. Fasching underwent a laparoscopic right hemicolectomy by Dr. Cliffton Asters on 07/07/2022.  She was discharged to home 07/10/2022.  She is having bowel movements.  The bowel movements are more frequent following surgery.  She developed a "ulcer "at the right side of the upper abdominal incision.  She continues to have exertional dyspnea.  She has dizziness when bending over to pick up things.  Objective:  Vital signs in last 24 hours:  Blood pressure (!) 150/70, pulse 79, temperature 98.1 F (36.7 C), temperature source Oral, resp. rate 20, height 5\' 3"  (1.6 m), weight 115 lb 12.8 oz (52.5 kg), SpO2 100 %.     Lymphatics: No cervical, supraclavicular, axillary, or inguinal nodes.  "Prominent "bilateral axillary fat pads Resp: Lungs clear bilaterally Cardio: Regular rate and rhythm GI: No mass, nontender, no hepatosplenomegaly, healed surgical incisions, 2 cm oval superficial ulcerated area at the right aspect of the upper abdominal incision.  No surrounding erythema or drainage. Vascular: No leg edema  Lab Results:  Lab Results  Component Value Date   WBC 6.5 07/09/2022   HGB 7.6 (L) 07/09/2022   HCT 27.7 (L) 07/09/2022   MCV 70.8 (L) 07/09/2022   PLT 488 (H) 07/09/2022   NEUTROABS 4.3 06/26/2022    CMP  Lab Results  Component Value Date   NA 136 07/09/2022   K 3.1 (L) 07/09/2022   CL 105 07/09/2022   CO2 24 07/09/2022   GLUCOSE 186 (H) 07/09/2022   BUN 6 (L) 07/09/2022   CREATININE 0.54 07/09/2022   CALCIUM 8.4 (L) 07/09/2022   PROT 7.4 06/26/2022   ALBUMIN 4.1 06/26/2022   AST 43 (H) 06/26/2022   ALT 45 (H) 06/26/2022   ALKPHOS 67 06/26/2022   BILITOT 0.3 06/26/2022   GFRNONAA >60 07/09/2022   GFRAA 131 02/13/2008    Lab Results  Component Value Date   CEA 1.89 05/16/2022     Medications: I have reviewed the patient's current  medications.   Assessment/Plan: Ascending colon cancer, stage I Colonoscopy 05/10/2022-small ulcerated mass in the ascending colon, invasive moderately differentiated adenocarcinoma Normal CEA CTs 05/23/2022-no evidence of metastatic disease.  Indeterminate potentially enhancing lesion anterior aspect of the upper pole of the left kidney MRI abdomen 06/12/2022-small simple renal cyst.  No suspicious renal masses. Laparoscopic right colectomy 07/07/2022-moderately differentiated adenocarcinoma of the ascending colon, pT2 pN0, 0/27 nodes, no lymphovascular perineural invasion, negative resection margins, no tumor deposits,loss of MLH1 and PMS2 expression, MSI-high, MLH1 hypermethylation present 2.  History of colon polyps-sessile serrated polyp and tubular adenoma in the ascending colon 2017 Colonoscopy November 2019-no polyps 3.  Type 2 diabetes 4.  Gastroesophageal reflux disease 5.   Seasonal allergies 6.   Diarrhea illness, colitis?  As a child 7.   IBS 8.   Hypertension 9.   Aortic atherosclerosis, calcifications of the aortic valve on the CT scan from 05/23/2022.  She has been referred to cardiology. 10.  Tiny nonaggressive nonenhancing 0.3 cm superior pancreatic body cystic lesion on abdominal MRI 06/12/2022-follow-up MRI abdomen with and without IV contrast recommended at a 2-year interval. 11.  Iron deficiency anemia    Disposition: Ms. Effinger underwent a right colectomy 07/07/2022.  I reviewed the surgical pathology report with her.  She has been diagnosed with stage I colon cancer.  The tumor does not have high risk features.  She has a good prognosis for long-term disease-free survival.  I do not recommend adjuvant therapy.  We will follow-up on mismatch repair protein and MSI testing on the resected specimen.  She will have a surveillance colonoscopy in 1 year.  Ms.Rinella will return for an office visit and CEA in 6 months.  She had persistent severe anemia when she was discharged from  the hospital.  She continues to have symptoms of anemia.  I recommend she continue iron therapy.  We will check a CBC and ferritin level today.  The iron deficiency should correct over the next several weeks.  Thornton Papas, MD  07/25/2022  11:14 AM

## 2022-07-26 ENCOUNTER — Other Ambulatory Visit: Payer: Self-pay

## 2022-07-26 ENCOUNTER — Telehealth: Payer: Self-pay

## 2022-07-26 DIAGNOSIS — C182 Malignant neoplasm of ascending colon: Secondary | ICD-10-CM

## 2022-07-26 NOTE — Telephone Encounter (Signed)
Patient gave verbal understanding and had no further question or concerns 

## 2022-07-26 NOTE — Telephone Encounter (Signed)
-----   Message from Ladene Artist, MD sent at 07/25/2022  5:02 PM EDT ----- Please call patient, the hemoglobin is higher than when she left the hospital, but remains low.  She still has iron deficiency.  Continue oral iron therapy.  Iron deficiency should correct over the next few weeks, I recommend she return for a CBC either here or with Dr. Milinda Antis in 3 to 4 weeks

## 2022-08-01 ENCOUNTER — Ambulatory Visit: Payer: Medicare Other | Admitting: Oncology

## 2022-08-07 ENCOUNTER — Encounter: Payer: Self-pay | Admitting: *Deleted

## 2022-08-07 ENCOUNTER — Inpatient Hospital Stay: Payer: Medicare Other | Attending: Oncology

## 2022-08-07 ENCOUNTER — Other Ambulatory Visit: Payer: Self-pay | Admitting: *Deleted

## 2022-08-07 DIAGNOSIS — D509 Iron deficiency anemia, unspecified: Secondary | ICD-10-CM | POA: Diagnosis not present

## 2022-08-07 DIAGNOSIS — C182 Malignant neoplasm of ascending colon: Secondary | ICD-10-CM | POA: Insufficient documentation

## 2022-08-07 LAB — CBC WITH DIFFERENTIAL (CANCER CENTER ONLY)
Abs Immature Granulocytes: 0.02 10*3/uL (ref 0.00–0.07)
Basophils Absolute: 0.1 10*3/uL (ref 0.0–0.1)
Basophils Relative: 1 %
Eosinophils Absolute: 0.2 10*3/uL (ref 0.0–0.5)
Eosinophils Relative: 3 %
HCT: 31.6 % — ABNORMAL LOW (ref 36.0–46.0)
Hemoglobin: 8.8 g/dL — ABNORMAL LOW (ref 12.0–15.0)
Immature Granulocytes: 0 %
Lymphocytes Relative: 24 %
Lymphs Abs: 1.7 10*3/uL (ref 0.7–4.0)
MCH: 20 pg — ABNORMAL LOW (ref 26.0–34.0)
MCHC: 27.8 g/dL — ABNORMAL LOW (ref 30.0–36.0)
MCV: 71.7 fL — ABNORMAL LOW (ref 80.0–100.0)
Monocytes Absolute: 0.6 10*3/uL (ref 0.1–1.0)
Monocytes Relative: 8 %
Neutro Abs: 4.5 10*3/uL (ref 1.7–7.7)
Neutrophils Relative %: 64 %
Platelet Count: 341 10*3/uL (ref 150–400)
RBC: 4.41 MIL/uL (ref 3.87–5.11)
RDW: 20 % — ABNORMAL HIGH (ref 11.5–15.5)
WBC Count: 7.1 10*3/uL (ref 4.0–10.5)
nRBC: 0 % (ref 0.0–0.2)

## 2022-08-07 MED ORDER — FERROUS GLUCONATE 324 (38 FE) MG PO TABS: 324.0000 mg | ORAL_TABLET | Freq: Two times a day (BID) | ORAL | 3 refills | Status: DC

## 2022-08-07 NOTE — Progress Notes (Signed)
PATIENT NAVIGATOR PROGRESS NOTE  Name: Donna Murphy Date: 08/07/2022 MRN: [id number]  DOB: 1945/12/19   Reason for visit:  Review molecular pathology results and lab work from today  Comments:  Discussed molecular path results with [name] regarding MMR abnormal with MSI high that it corresponds with good prognosis and that the tumor appears to be sporadic.  Also from today's CBC results to increase her Ferrous GLuconate to BID from once a day to return in 3 weeks for labs Appointment made for 5/28    Time spent counseling/coordinating care: 45-60 minutes

## 2022-08-08 ENCOUNTER — Telehealth: Payer: Self-pay

## 2022-08-08 NOTE — Telephone Encounter (Signed)
Patient gave verbal understanding and had no further questions or concerns  

## 2022-08-08 NOTE — Telephone Encounter (Signed)
-----   Message from Ladene Artist, MD sent at 08/07/2022  4:32 PM EDT ----- Please call patient, hb is still low, start ferrous sulfate 325mg  bid, check cbc 2-3 weeks

## 2022-08-11 ENCOUNTER — Encounter: Payer: Self-pay | Admitting: Cardiology

## 2022-08-11 ENCOUNTER — Ambulatory Visit: Payer: Medicare Other | Attending: Internal Medicine | Admitting: Cardiology

## 2022-08-11 VITALS — BP 158/81 | HR 92 | Ht 63.0 in | Wt 117.0 lb

## 2022-08-11 DIAGNOSIS — Z79899 Other long term (current) drug therapy: Secondary | ICD-10-CM

## 2022-08-11 DIAGNOSIS — E1169 Type 2 diabetes mellitus with other specified complication: Secondary | ICD-10-CM | POA: Diagnosis not present

## 2022-08-11 DIAGNOSIS — Z7984 Long term (current) use of oral hypoglycemic drugs: Secondary | ICD-10-CM | POA: Diagnosis not present

## 2022-08-11 DIAGNOSIS — I2510 Atherosclerotic heart disease of native coronary artery without angina pectoris: Secondary | ICD-10-CM | POA: Diagnosis not present

## 2022-08-11 DIAGNOSIS — D649 Anemia, unspecified: Secondary | ICD-10-CM

## 2022-08-11 DIAGNOSIS — E785 Hyperlipidemia, unspecified: Secondary | ICD-10-CM

## 2022-08-11 MED ORDER — ROSUVASTATIN CALCIUM 40 MG PO TABS: 40.0000 mg | ORAL_TABLET | Freq: Every day | ORAL | 3 refills | Status: DC

## 2022-08-11 NOTE — Patient Instructions (Signed)
Medication Instructions:  Please discontinue your Atorvastatin and start Crestor 40 mg a day. Continue all other medications as listed.  *If you need a refill on your cardiac medications before your next appointment, please call your pharmacy*   Lab Work: Please have blood work in 3 months (Lipid, CBC)  If you have labs (blood work) drawn today and your tests are completely normal, you will receive your results only by: MyChart Message (if you have MyChart) OR A paper copy in the mail If you have any lab test that is abnormal or we need to change your treatment, we will call you to review the results.   Follow-Up: At [facility], you and your health needs are our priority.  As part of our continuing mission to provide you with exceptional heart care, we have created designated Provider Care Teams.  These Care Teams include your primary Cardiologist (physician) and Advanced Practice Providers (APPs -  Physician Assistants and Nurse Practitioners) who all work together to provide you with the care you need, when you need it.  We recommend signing up for the patient portal called "MyChart".  Sign up information is provided on this After Visit Summary.  MyChart is used to connect with patients for Virtual Visits (Telemedicine).  Patients are able to view lab/test results, encounter notes, upcoming appointments, etc.  Non-urgent messages can be sent to your provider as well.   To learn more about what you can do with MyChart, go to [email or web address].    Your next appointment:   3 month(s)  Provider:   Jari Favre, PA-C, Robin Searing, NP, Jacolyn Reedy, PA-C, Eligha Bridegroom, NP, or Tereso Newcomer, PA-C

## 2022-08-11 NOTE — Progress Notes (Signed)
Cardiology Office Note:    Date:  08/11/2022   ID:  Donna Murphy, DOB October 16, 1945, MRN [id number]  PCP:  Judy Pimple, MD   [facility] Providers Cardiologist:  None     Referring MD: Judy Pimple, MD    History of Present Illness:    Donna Murphy is a 77 y.o. female here for the evaluation of coronary artery calcification as well as aortic atherosclerosis at the request of Dr. Milinda Antis.  She has a fairly significant coronary atherosclerosis multivessel as well as aortic atherosclerosis.  Personally reviewed CT scan and presented the images to her.  Colon Ca and surgery.  Has been dealing with decreased hemoglobin.  Last check 8.8.  Walking 3 miles a day. [place]  Has had some atrophy of muscles in her legs since surgery.  Denies any chest discomfort.  Brother 3 stents, Mother CABG.   Has diabetes.  Past Medical History:  Diagnosis Date   Allergic rhinitis    Allergy    Anemia    Arthritis    Blood transfusion without reported diagnosis 05/06/2022   DMII (diabetes mellitus, type 2) (HCC)    Dyspnea    due to anemia   Fibroids    uterine   GERD (gastroesophageal reflux disease)    History of kidney stones    HLD (hyperlipidemia)    HTN (hypertension)    Hx of colonic polyps 03/01/2016   Irritable bowel syndrome    Skin cancer of lip    basal cell   Varices of other sites     Past Surgical History:  Procedure Laterality Date   COLONOSCOPY  12/2005   negative - gessner   COLONOSCOPY WITH ESOPHAGOGASTRODUODENOSCOPY (EGD)  05/10/2022   ENDOMETRIAL BIOPSY  01/2000   negative   LAPAROSCOPIC PARTIAL RIGHT COLECTOMY Right 07/07/2022   Procedure: LAPAROSCOPIC RIGHT HEMICOLECTOMY;  Surgeon: Andria Meuse, MD;  Location: [facility];  Service: General;  Laterality: Right;   TONSILLECTOMY     TOOTH EXTRACTION      Current Medications: Current Meds  Medication Sig   amLODipine (NORVASC) 5 MG tablet TAKE 1 TABLET (5 MG TOTAL) BY  MOUTH DAILY.   aspirin 81 MG tablet Take 81 mg by mouth daily.   cetirizine (ZYRTEC) 10 MG tablet Take 10 mg by mouth daily.   Cholecalciferol (VITAMIN D) 2000 UNITS tablet Take 2,000 Units by mouth daily.   cyanocobalamin (VITAMIN B12) 1000 MCG tablet Take 1 tablet (1,000 mcg total) by mouth daily.   famotidine (PEPCID) 20 MG tablet TAKE 1 TABLET BY MOUTH EVERYDAY AT BEDTIME   FARXIGA 10 MG TABS tablet Take 10 mg by mouth daily.   ferrous gluconate (FERGON) 324 MG tablet Take 1 tablet (324 mg total) by mouth 2 (two) times daily with a meal.   folic acid (FOLVITE) 1 MG tablet Take 1 tablet (1 mg total) by mouth daily.   glucose blood (ONETOUCH VERIO) test strip Ck blood sugar once daily and as directed. Dx E11.9   JANUVIA 100 MG tablet Take 100 mg by mouth daily.   Krill Oil 300 MG CAPS Take 300 mg by mouth daily.   metFORMIN (GLUCOPHAGE-XR) 500 MG 24 hr tablet Take 1,000 mg by mouth 2 (two) times daily.   Multiple Vitamin (MULTIVITAMIN) tablet Take 1 tablet by mouth daily.   omeprazole (PRILOSEC) 40 MG capsule Take 40 mg by mouth daily.   rosuvastatin (CRESTOR) 40 MG tablet Take 1 tablet (  40 mg total) by mouth daily.   [DISCONTINUED] atorvastatin (LIPITOR) 40 MG tablet TAKE 1 TABLET BY MOUTH EVERY DAY     Allergies:   Ciprofloxacin and Sulfonamide derivatives   Social History   Socioeconomic History   Marital status: Widowed    Spouse name: Not on file   Number of children: 3   Years of education: Not on file   Highest education level: Not on file  Occupational History   Occupation: retired  Tobacco Use   Smoking status: Never   Smokeless tobacco: Never  Vaping Use   Vaping Use: Never used  Substance and Sexual Activity   Alcohol use: Yes    Alcohol/week: 7.0 - 10.0 standard drinks of alcohol    Types: 7 - 10 Glasses of wine per week    Comment: a glass a wine 3 times a week   Drug use: No   Sexual activity: Yes    Birth control/protection: Post-menopausal  Other Topics  Concern   Not on file  Social History Narrative   Widowed, 2 sons 1 daughter.  1 son is married to an anesthesiologist.   Retired.   Never smoker   Alcohol 1 beverage 3 times a week   2 caffeinated beverages daily   No other tobacco no drug use   Walks for exercise; does yardwork too   Social Determinants of Corporate investment banker Strain: Not on file  Food Insecurity: No Food Insecurity (07/11/2022)   Hunger Vital Sign    Worried About Running Out of Food in the Last Year: Never true    Ran Out of Food in the Last Year: Never true  Transportation Needs: No Transportation Needs (07/11/2022)   PRAPARE - Administrator, Civil Service (Medical): No    Lack of Transportation (Non-Medical): No  Physical Activity: Not on file  Stress: Not on file  Social Connections: Not on file     Family History: The patient's family history includes Brain cancer in her sister; Coronary artery disease in her mother; Diabetes in her father and unknown relative; Heart attack in her mother; Heart failure in her father; Heart murmur in her son; Hyperlipidemia in her mother; Hypertension in her brother, father, and mother; Osteoporosis in her mother; Transient ischemic attack in her father. There is no history of Colon cancer, Esophageal cancer, Stomach cancer, or Rectal cancer.  ROS:   Please see the history of present illness.     All other systems reviewed and are negative.  EKGs/Labs/Other Studies Reviewed:    The following studies were reviewed today:      EKG: 05/05/2022-sinus rhythm 98 no other significant abnormalities.  Recent Labs: 06/26/2022: ALT 45 07/09/2022: BUN 6; Creatinine, Ser 0.54; Potassium 3.1; Sodium 136 08/07/2022: Hemoglobin 8.8; Platelet Count 341  Recent Lipid Panel    Component Value Date/Time   CHOL 149 04/13/2021 1216   TRIG 113.0 04/13/2021 1216   HDL 52.50 04/13/2021 1216   CHOLHDL 3 04/13/2021 1216   VLDL 22.6 04/13/2021 1216   LDLCALC 73 04/13/2021  1216     Risk Assessment/Calculations:              Physical Exam:    VS:  BP (!) 158/81 (BP Location: Left Arm, Patient Position: Sitting, Cuff Size: Normal)   Pulse 92   Ht 5\' 3"  (1.6 m)   Wt 117 lb (53.1 kg)   BMI 20.73 kg/m     Wt Readings from Last 3 Encounters:  08/11/22 117 lb (53.1 kg)  07/25/22 115 lb 12.8 oz (52.5 kg)  07/10/22 124 lb (56.2 kg)     GEN:  Well nourished, well developed in no acute distress HEENT: Normal NECK: No JVD; No carotid bruits LYMPHATICS: No lymphadenopathy CARDIAC: RRR, no murmurs, rubs, gallops RESPIRATORY:  Clear to auscultation without rales, wheezing or rhonchi  ABDOMEN: Soft, non-tender, non-distended MUSCULOSKELETAL:  No edema; No deformity  SKIN: Warm and dry NEUROLOGIC:  Alert and oriented x 3 PSYCHIATRIC:  Normal affect   ASSESSMENT:    1. Coronary artery disease involving native coronary artery of native heart without angina pectoris   2. Hyperlipidemia associated with type 2 diabetes mellitus (HCC)   3. Symptomatic anemia   4. Medication management    PLAN:    In order of problems listed above:  Coronary atherosclerosis/aortic atherosclerosis Hyperlipidemia Family history of CAD History of colon cancer - I would like to check a stress test.  I want to wait and see how her hemoglobin responds however.  Currently it is 8.8.  Optimally, I would like to see her hemoglobin above 10, 12 would be even better in order to perform an effective test.  Continue to monitor symptoms.  Obviously her dyspnea on exertion could be anemia related.  If symptoms worsen or intensify, could consider more invasive evaluation of coronary anatomy. -I will change her from atorvastatin 40 mg over to rosuvastatin 40 mg.  Prior LDL in January 2023 was 73.  I would like her LDL to be less than 70.  She is also on aspirin 81 mg a day.  Excellent for secondary prevention.  We will have her come back in 3 months check lipid panel and see APP.  If  hemoglobin is improved, proceed with nuclear stress test.        Medication Adjustments/Labs and Tests Ordered: Current medicines are reviewed at length with the patient today.  Concerns regarding medicines are outlined above.  Orders Placed This Encounter  Procedures   Lipid panel   CBC   Meds ordered this encounter  Medications   rosuvastatin (CRESTOR) 40 MG tablet    Sig: Take 1 tablet (40 mg total) by mouth daily.    Dispense:  90 tablet    Refill:  3    Patient Instructions  Medication Instructions:  Please discontinue your Atorvastatin and start Crestor 40 mg a day. Continue all other medications as listed.  *If you need a refill on your cardiac medications before your next appointment, please call your pharmacy*   Lab Work: Please have blood work in 3 months (Lipid, CBC)  If you have labs (blood work) drawn today and your tests are completely normal, you will receive your results only by: MyChart Message (if you have MyChart) OR A paper copy in the mail If you have any lab test that is abnormal or we need to change your treatment, we will call you to review the results.   Follow-Up: At [facility], you and your health needs are our priority.  As part of our continuing mission to provide you with exceptional heart care, we have created designated Provider Care Teams.  These Care Teams include your primary Cardiologist (physician) and Advanced Practice Providers (APPs -  Physician Assistants and Nurse Practitioners) who all work together to provide you with the care you need, when you need it.  We recommend signing up for the patient portal called "MyChart".  Sign up information is provided on this After Visit  Summary.  MyChart is used to connect with patients for Virtual Visits (Telemedicine).  Patients are able to view lab/test results, encounter notes, upcoming appointments, etc.  Non-urgent messages can be sent to your provider as well.   To learn more  about what you can do with MyChart, go to [email or web address].    Your next appointment:   3 month(s)  Provider:   Jari Favre, PA-C, Robin Searing, NP, Jacolyn Reedy, PA-C, Eligha Bridegroom, NP, or Tereso Newcomer, PA-C           Signed, Donato Schultz, MD  08/11/2022 12:30 PM    [facility]

## 2022-08-14 ENCOUNTER — Ambulatory Visit
Admission: RE | Admit: 2022-08-14 | Discharge: 2022-08-14 | Disposition: A | Discharge status: Home / Self Care | Payer: Medicare Other | Source: Ambulatory Visit | Attending: Family Medicine | Admitting: Family Medicine

## 2022-08-14 DIAGNOSIS — Z1231 Encounter for screening mammogram for malignant neoplasm of breast: Secondary | ICD-10-CM

## 2022-08-26 ENCOUNTER — Other Ambulatory Visit: Payer: Self-pay | Admitting: Family Medicine

## 2022-08-29 ENCOUNTER — Inpatient Hospital Stay: Payer: Medicare Other

## 2022-08-29 DIAGNOSIS — C182 Malignant neoplasm of ascending colon: Secondary | ICD-10-CM | POA: Diagnosis not present

## 2022-08-29 DIAGNOSIS — D509 Iron deficiency anemia, unspecified: Secondary | ICD-10-CM | POA: Diagnosis not present

## 2022-08-29 LAB — CBC WITH DIFFERENTIAL (CANCER CENTER ONLY)
Abs Immature Granulocytes: 0.01 10*3/uL (ref 0.00–0.07)
Basophils Absolute: 0 10*3/uL (ref 0.0–0.1)
Basophils Relative: 1 %
Eosinophils Absolute: 0.3 10*3/uL (ref 0.0–0.5)
Eosinophils Relative: 3 %
HCT: 33.8 % — ABNORMAL LOW (ref 36.0–46.0)
Hemoglobin: 9.8 g/dL — ABNORMAL LOW (ref 12.0–15.0)
Immature Granulocytes: 0 %
Lymphocytes Relative: 29 %
Lymphs Abs: 2.1 10*3/uL (ref 0.7–4.0)
MCH: 21.2 pg — ABNORMAL LOW (ref 26.0–34.0)
MCHC: 29 g/dL — ABNORMAL LOW (ref 30.0–36.0)
MCV: 73.2 fL — ABNORMAL LOW (ref 80.0–100.0)
Monocytes Absolute: 0.5 10*3/uL (ref 0.1–1.0)
Monocytes Relative: 7 %
Neutro Abs: 4.4 10*3/uL (ref 1.7–7.7)
Neutrophils Relative %: 60 %
Platelet Count: 512 10*3/uL — ABNORMAL HIGH (ref 150–400)
RBC: 4.62 MIL/uL (ref 3.87–5.11)
RDW: 21.9 % — ABNORMAL HIGH (ref 11.5–15.5)
WBC Count: 7.4 10*3/uL (ref 4.0–10.5)
nRBC: 0 % (ref 0.0–0.2)

## 2022-08-29 LAB — FERRITIN: Ferritin: 4 ng/mL — ABNORMAL LOW (ref 11–307)

## 2022-08-30 ENCOUNTER — Other Ambulatory Visit: Payer: Self-pay

## 2022-08-30 DIAGNOSIS — C182 Malignant neoplasm of ascending colon: Secondary | ICD-10-CM

## 2022-09-27 ENCOUNTER — Inpatient Hospital Stay: Payer: Medicare Other | Attending: Oncology

## 2022-09-27 DIAGNOSIS — C182 Malignant neoplasm of ascending colon: Secondary | ICD-10-CM | POA: Insufficient documentation

## 2022-09-27 LAB — CBC WITH DIFFERENTIAL (CANCER CENTER ONLY)
Abs Immature Granulocytes: 0.02 10*3/uL (ref 0.00–0.07)
Basophils Absolute: 0 10*3/uL (ref 0.0–0.1)
Basophils Relative: 1 %
Eosinophils Absolute: 0.1 10*3/uL (ref 0.0–0.5)
Eosinophils Relative: 1 %
HCT: 37.6 % (ref 36.0–46.0)
Hemoglobin: 11.3 g/dL — ABNORMAL LOW (ref 12.0–15.0)
Immature Granulocytes: 0 %
Lymphocytes Relative: 27 %
Lymphs Abs: 2 10*3/uL (ref 0.7–4.0)
MCH: 23.1 pg — ABNORMAL LOW (ref 26.0–34.0)
MCHC: 30.1 g/dL (ref 30.0–36.0)
MCV: 76.9 fL — ABNORMAL LOW (ref 80.0–100.0)
Monocytes Absolute: 0.4 10*3/uL (ref 0.1–1.0)
Monocytes Relative: 5 %
Neutro Abs: 4.7 10*3/uL (ref 1.7–7.7)
Neutrophils Relative %: 66 %
Platelet Count: 366 10*3/uL (ref 150–400)
RBC: 4.89 MIL/uL (ref 3.87–5.11)
RDW: 22.6 % — ABNORMAL HIGH (ref 11.5–15.5)
WBC Count: 7.1 10*3/uL (ref 4.0–10.5)
nRBC: 0 % (ref 0.0–0.2)

## 2022-09-27 LAB — FERRITIN: Ferritin: 10 ng/mL — ABNORMAL LOW (ref 11–307)

## 2022-09-28 ENCOUNTER — Telehealth: Payer: Self-pay | Admitting: *Deleted

## 2022-09-28 NOTE — Telephone Encounter (Signed)
Per Dr.Sherrill, called pt with message below. Pt verbalized understanding.  

## 2022-09-28 NOTE — Telephone Encounter (Signed)
-----   Message from Ladene Artist, MD sent at 09/27/2022  5:34 PM EDT ----- Please call patient with the hemoglobin is improved, but still mildly low, iron level remains low, continue iron, repeat CBC and ferritin in 2 months

## 2022-10-08 ENCOUNTER — Other Ambulatory Visit: Payer: Self-pay | Admitting: Family Medicine

## 2022-10-09 ENCOUNTER — Other Ambulatory Visit: Payer: Self-pay | Admitting: Family Medicine

## 2022-10-25 ENCOUNTER — Other Ambulatory Visit: Payer: Self-pay | Admitting: Oncology

## 2022-10-25 ENCOUNTER — Other Ambulatory Visit: Payer: Self-pay | Admitting: Family Medicine

## 2022-10-31 DIAGNOSIS — H40052 Ocular hypertension, left eye: Secondary | ICD-10-CM | POA: Diagnosis not present

## 2022-10-31 DIAGNOSIS — H2513 Age-related nuclear cataract, bilateral: Secondary | ICD-10-CM | POA: Diagnosis not present

## 2022-10-31 DIAGNOSIS — H401111 Primary open-angle glaucoma, right eye, mild stage: Secondary | ICD-10-CM | POA: Diagnosis not present

## 2022-11-10 DIAGNOSIS — H401111 Primary open-angle glaucoma, right eye, mild stage: Secondary | ICD-10-CM | POA: Diagnosis not present

## 2022-11-10 DIAGNOSIS — H25812 Combined forms of age-related cataract, left eye: Secondary | ICD-10-CM | POA: Diagnosis not present

## 2022-11-10 DIAGNOSIS — H40052 Ocular hypertension, left eye: Secondary | ICD-10-CM | POA: Diagnosis not present

## 2022-11-10 DIAGNOSIS — H25811 Combined forms of age-related cataract, right eye: Secondary | ICD-10-CM | POA: Diagnosis not present

## 2022-11-13 ENCOUNTER — Other Ambulatory Visit: Payer: Medicare Other

## 2022-11-17 ENCOUNTER — Ambulatory Visit: Payer: Medicare Other | Admitting: Nurse Practitioner

## 2022-12-03 HISTORY — PX: EYE SURGERY: SHX253

## 2022-12-18 ENCOUNTER — Other Ambulatory Visit: Payer: Medicare Other

## 2022-12-18 DIAGNOSIS — H25811 Combined forms of age-related cataract, right eye: Secondary | ICD-10-CM | POA: Diagnosis not present

## 2022-12-18 DIAGNOSIS — H401111 Primary open-angle glaucoma, right eye, mild stage: Secondary | ICD-10-CM | POA: Diagnosis not present

## 2022-12-20 ENCOUNTER — Ambulatory Visit: Payer: Medicare Other | Attending: Cardiology

## 2022-12-20 DIAGNOSIS — E1169 Type 2 diabetes mellitus with other specified complication: Secondary | ICD-10-CM | POA: Diagnosis not present

## 2022-12-20 DIAGNOSIS — D649 Anemia, unspecified: Secondary | ICD-10-CM | POA: Diagnosis not present

## 2022-12-20 DIAGNOSIS — Z79899 Other long term (current) drug therapy: Secondary | ICD-10-CM | POA: Diagnosis not present

## 2022-12-20 DIAGNOSIS — E785 Hyperlipidemia, unspecified: Secondary | ICD-10-CM | POA: Diagnosis not present

## 2022-12-21 LAB — CBC
Hematocrit: 44.7 % (ref 34.0–46.6)
Hemoglobin: 14.1 g/dL (ref 11.1–15.9)
MCH: 27.4 pg (ref 26.6–33.0)
MCHC: 31.5 g/dL (ref 31.5–35.7)
MCV: 87 fL (ref 79–97)
Platelets: 331 10*3/uL (ref 150–450)
RBC: 5.15 x10E6/uL (ref 3.77–5.28)
RDW: 15.6 % — ABNORMAL HIGH (ref 11.7–15.4)
WBC: 7.6 10*3/uL (ref 3.4–10.8)

## 2022-12-21 LAB — LIPID PANEL
Chol/HDL Ratio: 2.1 ratio (ref 0.0–4.4)
Cholesterol, Total: 128 mg/dL (ref 100–199)
HDL: 61 mg/dL (ref >39)
LDL Chol Calc (NIH): 50 mg/dL (ref 0–99)
Triglycerides: 91 mg/dL (ref 0–149)
VLDL Cholesterol Cal: 17 mg/dL (ref 5–40)

## 2022-12-21 NOTE — Progress Notes (Signed)
Cardiology Office Note:  .   Date:  12/22/2022  ID:  [name], DOB 1945-09-01, MRN [id number] PCP: Judy Pimple, MD  [facility] Providers Cardiologist:  Donato Schultz, MD    Patient Profile: .      PMH Coronary artery calcification Aortic atherosclerosis Type 2 DM Family history CAD Brother - 3 coronary stents Mother - CABG, very high cholesterol Anemia Colon cancer S/p colectomy 07/07/2022 No chemo or radiation Hyperlipidemia  Referred to cardiology and seen by Dr. Anne Fu on 08/11/2022 for evaluation of coronary artery calcification as well as aortic atherosclerosis.  Was dealing with anemia from colon cancer. She had fairly significant coronary atherosclerosis on multiple vessels on CT. Dr. Anne Fu recommended getting a stress test but he wanted hemoglobin to be above 10, 12 would be better for effective test. She was having dyspnea felt possibly secondary to anemia. Walking 3 miles per day. Prior LDL in January 2023 was 73. Her atorvastatin was changed to rosuvastatin 40 mg and 3 month follow-up was recommended.       History of Present Illness: .   Donna Murphy is a very pleasant  77 y.o. female who is here today to follow-up on coronary artery calcification. She just returned from trip to [place] and [place] and reports she is generally feeling much better. She unfortunately continues to have bouts of diarrhea following colon resection surgery. She has occasional chest discomfort she attributes to acid reflux. Has noted shortness of breath when walking up hills. Unfortunately, she cannot tolerate many of the healthy foods she knows she should eat and is  eating more carbs than she would like. She cannot tolerate nuts or raw vegetables. She denies palpitations, orthopnea, PND, edema, presyncope or syncope. We had a lengthy discussion about risks associated with coronary artery calcification seen on CT.    ROS: See HPI       Studies Reviewed: .        Risk  Assessment/Calculations:             Physical Exam:   VS:  BP 116/72   Pulse 83   Ht 5\' 3"  (1.6 m)   Wt 118 lb 6.4 oz (53.7 kg)   SpO2 (!) 83%   BMI 20.97 kg/m    Wt Readings from Last 3 Encounters:  12/22/22 118 lb 6.4 oz (53.7 kg)  08/11/22 117 lb (53.1 kg)  07/25/22 115 lb 12.8 oz (52.5 kg)    GEN: Well nourished, well developed in no acute distress NECK: No JVD; No carotid bruits CARDIAC: RRR, no murmurs, rubs, gallops RESPIRATORY:  Clear to auscultation without rales, wheezing or rhonchi  ABDOMEN: Soft, non-tender, non-distended EXTREMITIES:  No edema; No deformity     ASSESSMENT AND PLAN: .    Coronary artery calcification: She is having shortness of breath with inclines and chest discomfort she thinks is secondary to acid reflux. SOB previously thought to be 2/2 anemia. Blood counts have improved on iron and vitamin B. Coronary artery calcification of all major vessels on CT 05/2022. She has additional risk factors for ASCVD including hyperlipidemia, family hx, hypertension, and diabetes. We will get coronary CTA for mapping of coronary anatomy. Will have her take lopressor 50 mg 2 hours prior. Continue asa, amlodipine, rosuvastatin, Farxiga.   Shortness of breath: As noted above, she continues to have SOB despite improvement in blood counts. We will get coronary CTA for evaluation of obstructive CAD. Consider echo if CT not clinically significant and if  symptoms persist.   Type 2 DM: A1C 7.1 on 06/26/22. Diet is higher in carbs now because of diarrhea 2/2 colon resection. Management per PCP.  Hyperlipidemia LDL goal < 70: LDL 50 on 12/20/22, excellent response to high dose rosuvastatin.        Dispo: 6 months with Dr. Anne Fu or me  Signed, Eligha Bridegroom, NP-C

## 2022-12-22 ENCOUNTER — Ambulatory Visit: Payer: Medicare Other | Attending: Nurse Practitioner | Admitting: Nurse Practitioner

## 2022-12-22 ENCOUNTER — Encounter: Payer: Self-pay | Admitting: Nurse Practitioner

## 2022-12-22 VITALS — BP 116/72 | HR 83 | Ht 63.0 in | Wt 118.4 lb

## 2022-12-22 DIAGNOSIS — I2584 Coronary atherosclerosis due to calcified coronary lesion: Secondary | ICD-10-CM | POA: Diagnosis not present

## 2022-12-22 DIAGNOSIS — E785 Hyperlipidemia, unspecified: Secondary | ICD-10-CM | POA: Insufficient documentation

## 2022-12-22 DIAGNOSIS — R0602 Shortness of breath: Secondary | ICD-10-CM | POA: Diagnosis not present

## 2022-12-22 DIAGNOSIS — E119 Type 2 diabetes mellitus without complications: Secondary | ICD-10-CM | POA: Diagnosis not present

## 2022-12-22 DIAGNOSIS — I2510 Atherosclerotic heart disease of native coronary artery without angina pectoris: Secondary | ICD-10-CM | POA: Insufficient documentation

## 2022-12-22 DIAGNOSIS — I700 Atherosclerosis of aorta: Secondary | ICD-10-CM | POA: Diagnosis not present

## 2022-12-22 MED ORDER — METOPROLOL TARTRATE 50 MG PO TABS: ORAL_TABLET | ORAL | 0 refills | Status: DC

## 2022-12-22 NOTE — Patient Instructions (Signed)
Medication Instructions:  Your physician recommends that you continue on your current medications as directed. Please refer to the Current Medication list given to you today.  *If you need a refill on your cardiac medications before your next appointment, please call your pharmacy*   Lab Work: TODAY:  BMET  If you have labs (blood work) drawn today and your tests are completely normal, you will receive your results only by: MyChart Message (if you have MyChart) OR A paper copy in the mail If you have any lab test that is abnormal or we need to change your treatment, we will call you to review the results.   Testing/Procedures: Your physician has requested that you have cardiac CT. Cardiac computed tomography (CT) is a painless test that uses an x-ray machine to take clear, detailed pictures of your heart. For further information please visit [email or web address] Please follow instruction sheet below:    Your cardiac CT will be scheduled at one of the below locations:   [facility]: [address] [place], [place] [address] [phone]  OR  [facility] [address] [place], [place] [address] [phone]  OR   [facility] [address] [place], [place] [address] [phone]  If scheduled at [facility], please arrive at the [facility] and Children's Entrance (Entrance C2) of [facility] 30 minutes prior to test start time. You can use the FREE valet parking offered at entrance C (encouraged to control the heart rate for the test)  Proceed to the [facility] Radiology Department (first floor) to check-in and test prep.  All radiology patients and guests should use entrance C2 at [facility], accessed from [facility], even though the hospital's physical address listed is [address].    If scheduled at [facility]  or [facility], please arrive 15 mins early for check-in and test prep.  There is spacious parking and easy access to the radiology department from the [facility] entrance. Please enter here and check-in with the desk attendant.   Please follow these instructions carefully (unless otherwise directed):  An IV will be required for this test and Nitroglycerin will be given.  Hold all erectile dysfunction medications at least 3 days (72 hrs) prior to test. (Ie viagra, cialis, sildenafil, tadalafil, etc)   On the Night Before the Test: Be sure to Drink plenty of water. Do not consume any caffeinated/decaffeinated beverages or chocolate 12 hours prior to your test. Do not take any antihistamines 12 hours prior to your test.   On the Day of the Test: Drink plenty of water until 1 hour prior to the test. Do not eat any food 1 hour prior to test. You may take your regular medications prior to the test.  Take metoprolol (Lopressor) 50 MGtwo hours prior to test. FEMALES- please wear underwire-free bra if available, avoid dresses & tight clothing      After the Test: Drink plenty of water. After receiving IV contrast, you may experience a mild flushed feeling. This is normal. On occasion, you may experience a mild rash up to 24 hours after the test. This is not dangerous. If this occurs, you can take Benadryl 25 mg and increase your fluid intake. If you experience trouble breathing, this can be serious. If it is severe call 911 IMMEDIATELY. If it is mild, please call our office. If you take any of  these medications: Glipizide/Metformin, Avandament, Glucavance, please do not take 48 hours after completing test unless otherwise instructed.  We will call to schedule your test 2-4 weeks out understanding that some insurance companies will need an authorization prior to the service being performed.   For more information and frequently asked questions, please visit our  website : [email or web address]  For non-scheduling related questions, please contact the cardiac imaging nurse navigator should you have any questions/concerns: Cardiac Imaging Nurse Navigators Direct Office Dial: [phone]   For scheduling needs, including cancellations and rescheduling, please call [place], [phone].     Follow-Up: At [facility], you and your health needs are our priority.  As part of our continuing mission to provide you with exceptional heart care, we have created designated Provider Care Teams.  These Care Teams include your primary Cardiologist (physician) and Advanced Practice Providers (APPs -  Physician Assistants and Nurse Practitioners) who all work together to provide you with the care you need, when you need it.  We recommend signing up for the patient portal called "MyChart".  Sign up information is provided on this After Visit Summary.  MyChart is used to connect with patients for Virtual Visits (Telemedicine).  Patients are able to view lab/test results, encounter notes, upcoming appointments, etc.  Non-urgent messages can be sent to your provider as well.   To learn more about what you can do with MyChart, go to [email or web address].    Your next appointment:   6 month(s)  Provider:   Donato Schultz, MD  or Eligha Bridegroom, NP         Other Instructions

## 2022-12-23 LAB — BASIC METABOLIC PANEL
BUN/Creatinine Ratio: 19 (ref 12–28)
BUN: 13 mg/dL (ref 8–27)
CO2: 24 mmol/L (ref 20–29)
Calcium: 10 mg/dL (ref 8.7–10.3)
Chloride: 102 mmol/L (ref 96–106)
Creatinine, Ser: 0.69 mg/dL (ref 0.57–1.00)
Glucose: 169 mg/dL — ABNORMAL HIGH (ref 70–99)
Potassium: 3.9 mmol/L (ref 3.5–5.2)
Sodium: 141 mmol/L (ref 134–144)
eGFR: 90 mL/min/{1.73_m2} (ref >59)

## 2022-12-26 DIAGNOSIS — Z23 Encounter for immunization: Secondary | ICD-10-CM | POA: Diagnosis not present

## 2022-12-27 ENCOUNTER — Telehealth: Payer: Self-pay

## 2022-12-27 NOTE — Telephone Encounter (Signed)
The patient called to inform us that she visited cardiology and her hemoglobin level is 14.1.

## 2023-01-01 DIAGNOSIS — H40052 Ocular hypertension, left eye: Secondary | ICD-10-CM | POA: Diagnosis not present

## 2023-01-01 DIAGNOSIS — H25812 Combined forms of age-related cataract, left eye: Secondary | ICD-10-CM | POA: Diagnosis not present

## 2023-01-01 DIAGNOSIS — H409 Unspecified glaucoma: Secondary | ICD-10-CM | POA: Diagnosis not present

## 2023-01-03 ENCOUNTER — Encounter (HOSPITAL_COMMUNITY): Payer: Self-pay

## 2023-01-04 ENCOUNTER — Telehealth (HOSPITAL_COMMUNITY): Payer: Self-pay | Admitting: Emergency Medicine

## 2023-01-04 NOTE — Telephone Encounter (Signed)
Attempted to call patient regarding upcoming cardiac CT appointment. °Left message on voicemail with name and callback number °Dwan Fennel RN Navigator Cardiac Imaging °[facility] °[phone] Office °[phone] Cell ° °

## 2023-01-04 NOTE — Telephone Encounter (Signed)
Attempted to call patient regarding upcoming cardiac CT appointment. Left message on voicemail with name and callback number Hayley Sharpe RN Navigator Cardiac Imaging [facility] [phone] Office   

## 2023-01-05 ENCOUNTER — Ambulatory Visit (HOSPITAL_COMMUNITY)
Admission: RE | Admit: 2023-01-05 | Discharge: 2023-01-05 | Disposition: A | Discharge status: Home / Self Care | Payer: Medicare Other | Source: Ambulatory Visit | Attending: Nurse Practitioner | Admitting: Nurse Practitioner

## 2023-01-05 ENCOUNTER — Ambulatory Visit (HOSPITAL_BASED_OUTPATIENT_CLINIC_OR_DEPARTMENT_OTHER)
Admission: RE | Admit: 2023-01-05 | Discharge: 2023-01-05 | Disposition: A | Discharge status: Home / Self Care | Payer: Medicare Other | Source: Ambulatory Visit | Attending: Cardiology | Admitting: Cardiology

## 2023-01-05 ENCOUNTER — Other Ambulatory Visit (HOSPITAL_COMMUNITY): Payer: Self-pay | Admitting: Emergency Medicine

## 2023-01-05 DIAGNOSIS — I700 Atherosclerosis of aorta: Secondary | ICD-10-CM | POA: Insufficient documentation

## 2023-01-05 DIAGNOSIS — I2510 Atherosclerotic heart disease of native coronary artery without angina pectoris: Secondary | ICD-10-CM | POA: Diagnosis not present

## 2023-01-05 DIAGNOSIS — R0602 Shortness of breath: Secondary | ICD-10-CM | POA: Diagnosis not present

## 2023-01-05 DIAGNOSIS — R931 Abnormal findings on diagnostic imaging of heart and coronary circulation: Secondary | ICD-10-CM

## 2023-01-05 MED ORDER — DILTIAZEM HCL 25 MG/5ML IV SOLN: 10.0000 mg | INTRAVENOUS | Status: DC | PRN

## 2023-01-05 MED ORDER — METOPROLOL TARTRATE 5 MG/5ML IV SOLN
INTRAVENOUS | Status: AC
  Filled 2023-01-05: qty 20

## 2023-01-05 MED ORDER — NITROGLYCERIN 0.4 MG SL SUBL
SUBLINGUAL_TABLET | SUBLINGUAL | Status: AC
  Filled 2023-01-05: qty 2

## 2023-01-05 MED ORDER — NITROGLYCERIN 0.4 MG SL SUBL
0.8000 mg | SUBLINGUAL_TABLET | Freq: Once | SUBLINGUAL | Status: AC
  Administered 2023-01-05: 0.8 mg via SUBLINGUAL

## 2023-01-05 MED ORDER — METOPROLOL TARTRATE 5 MG/5ML IV SOLN
10.0000 mg | Freq: Once | INTRAVENOUS | Status: AC | PRN
  Administered 2023-01-05: 5 mg via INTRAVENOUS

## 2023-01-05 MED ORDER — IOHEXOL 350 MG/ML SOLN
100.0000 mL | Freq: Once | INTRAVENOUS | Status: AC | PRN
  Administered 2023-01-05: 100 mL via INTRAVENOUS

## 2023-01-22 ENCOUNTER — Other Ambulatory Visit: Payer: Self-pay | Admitting: Family Medicine

## 2023-01-24 ENCOUNTER — Ambulatory Visit: Payer: Medicare Other | Admitting: Oncology

## 2023-01-24 ENCOUNTER — Inpatient Hospital Stay: Payer: Medicare Other

## 2023-01-29 ENCOUNTER — Inpatient Hospital Stay (HOSPITAL_BASED_OUTPATIENT_CLINIC_OR_DEPARTMENT_OTHER): Payer: Medicare Other | Admitting: Nurse Practitioner

## 2023-01-29 ENCOUNTER — Inpatient Hospital Stay: Payer: Medicare Other | Attending: Oncology

## 2023-01-29 ENCOUNTER — Encounter: Payer: Self-pay | Admitting: Nurse Practitioner

## 2023-01-29 ENCOUNTER — Other Ambulatory Visit (HOSPITAL_BASED_OUTPATIENT_CLINIC_OR_DEPARTMENT_OTHER): Payer: Self-pay

## 2023-01-29 VITALS — BP 133/77 | HR 85 | Temp 98.1°F | Resp 16 | Ht 63.0 in | Wt 117.5 lb

## 2023-01-29 DIAGNOSIS — I10 Essential (primary) hypertension: Secondary | ICD-10-CM | POA: Insufficient documentation

## 2023-01-29 DIAGNOSIS — D509 Iron deficiency anemia, unspecified: Secondary | ICD-10-CM | POA: Diagnosis not present

## 2023-01-29 DIAGNOSIS — Z08 Encounter for follow-up examination after completed treatment for malignant neoplasm: Secondary | ICD-10-CM | POA: Insufficient documentation

## 2023-01-29 DIAGNOSIS — C182 Malignant neoplasm of ascending colon: Secondary | ICD-10-CM

## 2023-01-29 DIAGNOSIS — E119 Type 2 diabetes mellitus without complications: Secondary | ICD-10-CM | POA: Diagnosis not present

## 2023-01-29 DIAGNOSIS — Z85038 Personal history of other malignant neoplasm of large intestine: Secondary | ICD-10-CM | POA: Diagnosis not present

## 2023-01-29 LAB — CEA (ACCESS): CEA (CHCC): 2.95 ng/mL (ref 0.00–5.00)

## 2023-01-29 MED ORDER — ZOSTER VAC RECOMB ADJUVANTED 50 MCG/0.5ML IM SUSR
0.5000 mL | Freq: Once | INTRAMUSCULAR | 0 refills | Status: AC
  Filled 2023-01-29: qty 0.5, 1d supply, fill #0

## 2023-01-29 NOTE — Progress Notes (Signed)
  [facility] OFFICE PROGRESS NOTE   Diagnosis: Colon cancer  INTERVAL HISTORY:   Donna Murphy returns as scheduled.  She feels well.  Typically bowels moving twice a day each morning, the second bowel movements tends to be loose.  Occasionally she will have a third bowel movement.  She takes Lomotil if needed.  No rectal bleeding.  Single episode of vomiting, diet related.  No abdominal pain.  Energy level continues to be improved.  She walks about 3 miles a day.  No dyspnea.  Objective:  Vital signs in last 24 hours:  Blood pressure 133/77, pulse 85, temperature 98.1 F (36.7 C), temperature source Oral, resp. rate 16, height 5\' 3"  (1.6 m), weight 117 lb 8 oz (53.3 kg), SpO2 98%.    Lymphatics: No palpable cervical, supraclavicular, axillary or inguinal lymph nodes. Resp: Lungs clear bilaterally. Cardio: Regular rate and rhythm. GI: Abdomen soft and nontender.  No hepatosplenomegaly. Vascular: No leg edema.  Lab Results:  Lab Results  Component Value Date   WBC 7.6 12/20/2022   HGB 14.1 12/20/2022   HCT 44.7 12/20/2022   MCV 87 12/20/2022   PLT 331 12/20/2022   NEUTROABS 4.7 09/27/2022    Imaging:  No results found.  Medications: I have reviewed the patient's current medications.  Assessment/Plan: Ascending colon cancer, stage I Colonoscopy 05/10/2022-small ulcerated mass in the ascending colon, invasive moderately differentiated adenocarcinoma Normal CEA CTs 05/23/2022-no evidence of metastatic disease.  Indeterminate potentially enhancing lesion anterior aspect of the upper pole of the left kidney MRI abdomen 06/12/2022-small simple renal cyst.  No suspicious renal masses. Laparoscopic right colectomy 07/07/2022-moderately differentiated adenocarcinoma of the ascending colon, pT2 pN0, 0/27 nodes, no lymphovascular perineural invasion, negative resection margins, no tumor deposits, loss of MLH1 and PMS2 expression, MSI-high, MLH1 hypermethylation present 2.   History of colon polyps-sessile serrated polyp and tubular adenoma in the ascending colon 2017 Colonoscopy November 2019-no polyps 3.  Type 2 diabetes 4.  Gastroesophageal reflux disease 5.   Seasonal allergies 6.   Diarrhea illness, colitis?  As a child 7.   IBS 8.   Hypertension 9.   Aortic atherosclerosis, calcifications of the aortic valve on the CT scan from 05/23/2022.  She has been referred to cardiology. 10.  Tiny nonaggressive nonenhancing 0.3 cm superior pancreatic body cystic lesion on abdominal MRI 06/12/2022-follow-up MRI abdomen with and without IV contrast recommended at a 2-year interval. 11.  Iron deficiency anemia  Disposition: Donna Murphy appears stable.  She remains in clinical remission from colon cancer.  We will follow-up on the CEA from today.  Referral placed to Dr. Leone Payor for a 1 year colonoscopy.  She had a CBC 12/20/2022.  Hemoglobin/MCV corrected into normal range.  She will return for a CEA and follow-up visit in 6 months.  Donna Murphy ANP/GNP-BC   01/29/2023  11:14 AM

## 2023-01-30 ENCOUNTER — Other Ambulatory Visit: Payer: Self-pay

## 2023-01-30 ENCOUNTER — Telehealth: Payer: Self-pay

## 2023-01-30 DIAGNOSIS — C182 Malignant neoplasm of ascending colon: Secondary | ICD-10-CM

## 2023-01-30 NOTE — Telephone Encounter (Signed)
-----   Message from Lonna Cobb sent at 01/30/2023  3:36 PM EDT ----- Please let her know the CEA is a little higher in normal range, repeat in 3 months.

## 2023-01-30 NOTE — Telephone Encounter (Signed)
Patient gave verbal understanding and had no further questions or concerns  

## 2023-02-08 DIAGNOSIS — C189 Malignant neoplasm of colon, unspecified: Secondary | ICD-10-CM | POA: Diagnosis not present

## 2023-02-08 DIAGNOSIS — I10 Essential (primary) hypertension: Secondary | ICD-10-CM | POA: Diagnosis not present

## 2023-02-08 DIAGNOSIS — K219 Gastro-esophageal reflux disease without esophagitis: Secondary | ICD-10-CM | POA: Diagnosis not present

## 2023-02-08 DIAGNOSIS — D649 Anemia, unspecified: Secondary | ICD-10-CM | POA: Diagnosis not present

## 2023-02-08 DIAGNOSIS — E7800 Pure hypercholesterolemia, unspecified: Secondary | ICD-10-CM | POA: Diagnosis not present

## 2023-02-08 DIAGNOSIS — E1165 Type 2 diabetes mellitus with hyperglycemia: Secondary | ICD-10-CM | POA: Diagnosis not present

## 2023-04-14 ENCOUNTER — Other Ambulatory Visit: Payer: Self-pay | Admitting: Family Medicine

## 2023-04-16 ENCOUNTER — Other Ambulatory Visit: Payer: Self-pay | Admitting: Family Medicine

## 2023-04-16 NOTE — Telephone Encounter (Signed)
 Pt is overdue for a CPE (labs prior), please schedule and then route back to me. If pt doesn't want CPE a f/u is okay

## 2023-04-16 NOTE — Telephone Encounter (Signed)
 Lvmtcb, sent mychart message

## 2023-04-16 NOTE — Telephone Encounter (Signed)
 Needs CPE scheduled; please call patient to set up appt.

## 2023-04-18 MED ORDER — FOLIC ACID 1 MG PO TABS: 1.0000 mg | ORAL_TABLET | Freq: Every day | ORAL | 0 refills | Status: DC

## 2023-04-18 NOTE — Addendum Note (Signed)
 Addended by: Queenie Brunet on: 04/18/2023 10:55 AM   Modules accepted: Orders

## 2023-04-23 ENCOUNTER — Encounter: Payer: Self-pay | Admitting: Family Medicine

## 2023-04-23 ENCOUNTER — Ambulatory Visit (INDEPENDENT_AMBULATORY_CARE_PROVIDER_SITE_OTHER): Payer: Medicare Other | Admitting: Family Medicine

## 2023-04-23 VITALS — BP 138/65 | HR 83 | Temp 97.6°F | Ht 63.0 in | Wt 111.2 lb

## 2023-04-23 DIAGNOSIS — Z79899 Other long term (current) drug therapy: Secondary | ICD-10-CM | POA: Insufficient documentation

## 2023-04-23 DIAGNOSIS — E119 Type 2 diabetes mellitus without complications: Secondary | ICD-10-CM

## 2023-04-23 DIAGNOSIS — I10 Essential (primary) hypertension: Secondary | ICD-10-CM

## 2023-04-23 DIAGNOSIS — K219 Gastro-esophageal reflux disease without esophagitis: Secondary | ICD-10-CM

## 2023-04-23 DIAGNOSIS — E1169 Type 2 diabetes mellitus with other specified complication: Secondary | ICD-10-CM | POA: Diagnosis not present

## 2023-04-23 DIAGNOSIS — Z7984 Long term (current) use of oral hypoglycemic drugs: Secondary | ICD-10-CM | POA: Diagnosis not present

## 2023-04-23 DIAGNOSIS — I2510 Atherosclerotic heart disease of native coronary artery without angina pectoris: Secondary | ICD-10-CM | POA: Diagnosis not present

## 2023-04-23 DIAGNOSIS — E538 Deficiency of other specified B group vitamins: Secondary | ICD-10-CM | POA: Diagnosis not present

## 2023-04-23 DIAGNOSIS — C182 Malignant neoplasm of ascending colon: Secondary | ICD-10-CM | POA: Diagnosis not present

## 2023-04-23 DIAGNOSIS — E785 Hyperlipidemia, unspecified: Secondary | ICD-10-CM | POA: Diagnosis not present

## 2023-04-23 LAB — TSH: TSH: 0.56 u[IU]/mL (ref 0.35–5.50)

## 2023-04-23 LAB — VITAMIN B12: Vitamin B-12: 258 pg/mL (ref 211–911)

## 2023-04-23 NOTE — Assessment & Plan Note (Signed)
bp in fair control at this time  BP Readings from Last 1 Encounters:  04/23/23 138/65   No changes needed Most recent labs reviewed  Disc lifstyle change with low sodium diet and exercise  Plan to continue amlodipine 5 mg  In setting of CAD Continues cardiology care

## 2023-04-23 NOTE — Assessment & Plan Note (Signed)
 B12 level today

## 2023-04-23 NOTE — Assessment & Plan Note (Signed)
Due for follow up with endo in 3 weeks  Diet change from colon cancer has caused worsened control  Continues  Metformin xr 1000 mg bid (? If worsening diarrhea)  Farxiga 10 mg daily  Januvia 100 mg daily  May have to add basal insulin Pt is unsure what her A1c was

## 2023-04-23 NOTE — Assessment & Plan Note (Signed)
Controlled with  Pepcid 20 mg daily  Omeprazole 40 mg daily  Encouraged to avoid triggers   B12 today

## 2023-04-23 NOTE — Assessment & Plan Note (Signed)
S/p hemicolectomy  Overall doing well but some diarrhea persists  Has /gI follow up planned and likely colonoscopy in feb Also surgery visit

## 2023-04-23 NOTE — Assessment & Plan Note (Signed)
Disc goals for lipids and reasons to control them Rev last labs with pt Rev low sat fat diet in detail Now on crestor 40 mg in setting of CAD on CT Seeing cardiology  No complaints

## 2023-04-23 NOTE — Progress Notes (Signed)
Subjective:    Patient ID: Donna Murphy, female    DOB: [birth year], 78 y.o.   MRN: [id number]  HPI  Here for annual follow up of chronic medical problems   Wt Readings from Last 3 Encounters:  04/23/23 111 lb 4 oz (50.5 kg)  01/29/23 117 lb 8 oz (53.3 kg)  12/22/22 118 lb 6.4 oz (53.7 kg)   19.71 kg/m  Vitals:   04/23/23 0912 04/23/23 0947  BP: (!) 144/62 138/65  Pulse: 83   Temp: 97.6 F (36.4 C)   SpO2: 98%    Has lost weight since hemicolectomy   Immunization History  Administered Date(s) Administered   H1N1 04/24/2008   Influenza Split 02/16/2011, 12/02/2012, 01/01/2014   Influenza Whole 02/06/2007, 12/30/2007, 01/05/2009   Influenza, High Dose Seasonal PF 12/17/2018, 12/20/2020, 01/17/2022, 12/26/2022   Influenza,inj,Quad PF,6+ Mos 01/20/2015, 01/19/2016, 12/05/2016, 01/01/2018   Influenza-Unspecified 12/30/2019   PFIZER Comirnaty(Gray Top)Covid-19 Tri-Sucrose Vaccine 08/05/2020   PFIZER(Purple Top)SARS-COV-2 Vaccination 04/25/2019, 05/17/2019, 12/30/2019   Pfizer Covid-19 Vaccine Bivalent Booster 5y-11y 12/20/2020   Pfizer(Comirnaty)Fall Seasonal Vaccine 12 years and older 12/26/2022   Pneumococcal Conjugate-13 11/27/2014   Pneumococcal Polysaccharide-23 11/17/2009, 11/27/2016   Respiratory Syncytial Virus Vaccine,Recomb Aduvanted(Arexvy) 01/17/2022   Td 11/06/2005   Tdap 01/21/2018   Zoster Recombinant(Shingrix) 01/29/2023   Zoster, Live 03/03/2013    Due for diabetes follow up with endo  Sent for eye exam report   Shingrix -had first dose 01/2023   Health Maintenance Due  Topic Date Due   OPHTHALMOLOGY EXAM  01/25/2019   FOOT EXAM  04/13/2021   HEMOGLOBIN A1C  12/27/2022   Zoster Vaccines- Shingrix (2 of 2) 03/26/2023   Colonoscopy  05/11/2023    Mammogram 08/2022  Self breast exam-no lumps   Gyn health  Colon cancer screening  -had a colonoscopy 05/2022 had tumor in ascending colon  Dx with primary adenocarcinoma of colon  Now s/p  hemicolectomy and still a fair amount of diarrhea from that   Bone health  Dexa  05/2016-normal at [facility]- none Fractures-none  Supplements - vitamin D3   Exercise  Walks 3 miles 6 d per week  Wants to start riding her bike soon/ feeling well enough to do it  Has some weights    Mood    05/31/2022   10:57 AM 05/16/2022    2:04 PM 04/13/2020   12:24 PM 02/18/2019    3:05 PM 01/01/2018    1:53 PM  Depression screen PHQ 2/9  Decreased Interest 0 0 0 0 0  Down, Depressed, Hopeless 0 0 0 0 0  PHQ - 2 Score 0 0 0 0 0  Altered sleeping 1  1    Tired, decreased energy 0  1    Change in appetite 0  0    Feeling bad or failure about yourself  0  0    Trouble concentrating 0  0    Moving slowly or fidgety/restless 0  0    Suicidal thoughts 0  0    PHQ-9 Score 1  2    Difficult doing work/chores Not difficult at all       Due for labs  Sees endo for DM  Dr Talmage Nap -? Last A1c  Metformin  [name]  Has been a challenge to stay under control with different diet in setting of colon cancer and diarrhea after hemicolectomy  Giving it 3 mo to improve  May have to start basal insulin   Eating better now (  after holidays)   Taking cinnamon   HTN bp is stable today  No cp or palpitations or headaches or edema  No side effects to medicines  BP Readings from Last 3 Encounters:  04/23/23 138/65  01/29/23 133/77  01/05/23 120/65    Amlodipine 5 mg daily   Lab Results  Component Value Date   NA 141 12/22/2022   K 3.9 12/22/2022   CO2 24 12/22/2022   GLUCOSE 169 (H) 12/22/2022   BUN 13 12/22/2022   CREATININE 0.69 12/22/2022   CALCIUM 10.0 12/22/2022   GFR 77.22 04/13/2021   EGFR 90 12/22/2022   GFRNONAA >60 07/09/2022     Hyperlipidemia  Lab Results  Component Value Date   CHOL 128 12/20/2022   HDL 61 12/20/2022   LDLCALC 50 12/20/2022   TRIG 91 12/20/2022   CHOLHDL 2.1 12/20/2022   Crestor 40 mg daily from cardiology  Checks this at the cardiologist   Had cardiac CT- some CAD/not severe   Anemia  Lab Results  Component Value Date   WBC 7.6 12/20/2022   HGB 14.1 12/20/2022   HCT 44.7 12/20/2022   MCV 87 12/20/2022   PLT 331 12/20/2022   Better with treatment of colon cancer   B12 def Lab Results  Component Value Date   VITAMINB12 811 05/31/2022    Lab Results  Component Value Date   TSH 0.53 04/13/2021      Patient Active Problem List   Diagnosis Date Noted   Current use of proton pump inhibitor 04/23/2023   S/P right hemicolectomy 07/07/2022   Vitamin B12 deficiency 05/31/2022   Aortic atherosclerosis (HCC) 05/31/2022   CAD (coronary artery disease) 05/31/2022   Primary adenocarcinoma of ascending colon (HCC) 05/11/2022   Symptomatic anemia 05/05/2022   Microcytic anemia 05/02/2022   Eye pressure 04/14/2022   SOB (shortness of breath) on exertion 04/14/2022   Hyperlipidemia associated with type 2 diabetes mellitus (HCC) 02/18/2019   Screening mammogram, encounter for 02/18/2019   Hx of colonic polyps 03/01/2016   DM (diabetes mellitus), type 2 (HCC) 12/04/2006   Essential hypertension 07/12/2006   VARICOSE VEIN 07/12/2006   Allergic rhinitis 07/12/2006   GERD 07/12/2006   IRRITABLE BOWEL SYNDROME 07/12/2006   Past Medical History:  Diagnosis Date   Allergic rhinitis    Allergy    Anemia    Arthritis    Blood transfusion without reported diagnosis 05/06/2022   DMII (diabetes mellitus, type 2) (HCC)    Dyspnea    due to anemia   Fibroids    uterine   GERD (gastroesophageal reflux disease)    History of kidney stones    HLD (hyperlipidemia)    HTN (hypertension)    Hx of colonic polyps 03/01/2016   Irritable bowel syndrome    Skin cancer of lip    basal cell   Varices of other sites    Past Surgical History:  Procedure Laterality Date   COLONOSCOPY  12/2005   negative - [facility]   COLONOSCOPY WITH ESOPHAGOGASTRODUODENOSCOPY (EGD)  05/10/2022   ENDOMETRIAL BIOPSY  01/2000   negative    LAPAROSCOPIC PARTIAL RIGHT COLECTOMY Right 07/07/2022   Procedure: LAPAROSCOPIC RIGHT HEMICOLECTOMY;  Surgeon: Andria Meuse, MD;  Location: [facility];  Service: General;  Laterality: Right;   TONSILLECTOMY     TOOTH EXTRACTION     Social History   Tobacco Use   Smoking status: Never   Smokeless tobacco: Never  Vaping Use   Vaping status: Never Used  Substance Use Topics   Alcohol use: Yes    Alcohol/week: 7.0 - 10.0 standard drinks of alcohol    Types: 7 - 10 Glasses of wine per week    Comment: a glass a wine 3 times a week   Drug use: No   Family History  Problem Relation Age of Onset   Coronary artery disease Mother    Hyperlipidemia Mother    Hypertension Mother    Osteoporosis Mother    Heart attack Mother        CABG in her 23's   Diabetes Father    Heart failure Father    Hypertension Father    Transient ischemic attack Father    Diabetes Unknown        GF   Hypertension Brother    Heart murmur Son    Brain cancer Sister    Colon cancer Neg Hx    Esophageal cancer Neg Hx    Stomach cancer Neg Hx    Rectal cancer Neg Hx    Allergies  Allergen Reactions   Ciprofloxacin     Arm numbness   Sulfonamide Derivatives Hives and Rash   Current Outpatient Medications on File Prior to Visit  Medication Sig Dispense Refill   amLODipine (NORVASC) 5 MG tablet TAKE 1 TABLET (5 MG TOTAL) BY MOUTH DAILY. 90 tablet 0   Ascorbic Acid (VITAMIN C PO) Take by mouth. 1 tab 2 times per day     aspirin 81 MG tablet Take 81 mg by mouth daily.     cetirizine (ZYRTEC) 10 MG tablet Take 10 mg by mouth daily as needed.     Cholecalciferol (VITAMIN D) 2000 UNITS tablet Take 2,000 Units by mouth daily.     famotidine (PEPCID) 20 MG tablet TAKE 1 TABLET BY MOUTH EVERYDAY AT BEDTIME 90 tablet 0   FARXIGA 10 MG TABS tablet Take 10 mg by mouth daily.  6   ferrous gluconate (FERGON) 324 MG tablet TAKE 1 TABLET (324 MG TOTAL) BY MOUTH 2 (TWO) TIMES DAILY WITH A MEAL. 180 tablet 1   folic  acid (FOLVITE) 1 MG tablet Take 1 tablet (1 mg total) by mouth daily. 90 tablet 0   glucose blood (ONETOUCH VERIO) test strip Ck blood sugar once daily and as directed. Dx E11.9 100 each 1   JANUVIA 100 MG tablet Take 100 mg by mouth daily.  5   Krill Oil 300 MG CAPS Take 300 mg by mouth daily.     metFORMIN (GLUCOPHAGE-XR) 500 MG 24 hr tablet Take 1,000 mg by mouth 2 (two) times daily.     Multiple Vitamin (MULTIVITAMIN) tablet Take 1 tablet by mouth daily.     omeprazole (PRILOSEC) 40 MG capsule TAKE 1 CAPSULE (40 MG TOTAL) BY MOUTH DAILY. 90 capsule 0   rosuvastatin (CRESTOR) 40 MG tablet Take 1 tablet (40 mg total) by mouth daily. 90 tablet 3   No current facility-administered medications on file prior to visit.    Review of Systems  Constitutional:  Negative for activity change, appetite change, fatigue, fever and unexpected weight change.  HENT:  Negative for congestion, ear pain, rhinorrhea, sinus pressure and sore throat.   Eyes:  Negative for pain, redness and visual disturbance.  Respiratory:  Negative for cough, shortness of breath and wheezing.   Cardiovascular:  Negative for chest pain and palpitations.  Gastrointestinal:  Negative for abdominal pain, blood in stool, constipation and diarrhea.  Endocrine: Negative for polydipsia and polyuria.  Genitourinary:  Negative for dysuria, frequency and urgency.  Musculoskeletal:  Negative for arthralgias, back pain and myalgias.  Skin:  Negative for pallor and rash.  Allergic/Immunologic: Negative for environmental allergies.  Neurological:  Negative for dizziness, syncope and headaches.  Hematological:  Negative for adenopathy. Does not bruise/bleed easily.  Psychiatric/Behavioral:  Negative for decreased concentration and dysphoric mood. The patient is not nervous/anxious.        Objective:   Physical Exam Constitutional:      General: She is not in acute distress.    Appearance: Normal appearance. She is well-developed and  normal weight. She is not ill-appearing or diaphoretic.  HENT:     Head: Normocephalic and atraumatic.     Right Ear: Tympanic membrane, ear canal and external ear normal.     Left Ear: Tympanic membrane, ear canal and external ear normal.     Nose: Nose normal. No congestion.     Mouth/Throat:     Mouth: Mucous membranes are moist.     Pharynx: Oropharynx is clear. No posterior oropharyngeal erythema.  Eyes:     General: No scleral icterus.    Extraocular Movements: Extraocular movements intact.     Conjunctiva/sclera: Conjunctivae normal.     Pupils: Pupils are equal, round, and reactive to light.  Neck:     Thyroid: No thyromegaly.     Vascular: No carotid bruit or JVD.  Cardiovascular:     Rate and Rhythm: Normal rate and regular rhythm.     Pulses: Normal pulses.     Heart sounds: Normal heart sounds.     No gallop.  Pulmonary:     Effort: Pulmonary effort is normal. No respiratory distress.     Breath sounds: Normal breath sounds. No wheezing.     Comments: Good air exch Chest:     Chest wall: No tenderness.  Abdominal:     General: Bowel sounds are normal. There is no distension or abdominal bruit.     Palpations: Abdomen is soft. There is no mass.     Tenderness: There is no abdominal tenderness.     Hernia: No hernia is present.  Genitourinary:    Comments: Breast exam: No mass, nodules, thickening, tenderness, bulging, retraction, inflamation, nipple discharge or skin changes noted.  No axillary or clavicular LA.     Musculoskeletal:        General: No tenderness. Normal range of motion.     Cervical back: Normal range of motion and neck supple. No rigidity. No muscular tenderness.     Right lower leg: No edema.     Left lower leg: No edema.     Comments: No kyphosis   Lymphadenopathy:     Cervical: No cervical adenopathy.  Skin:    General: Skin is warm and dry.     Coloration: Skin is not pale.     Findings: No erythema or rash.     Comments: Solar  lentigines diffusely   Neurological:     Mental Status: She is alert. Mental status is at baseline.     Cranial Nerves: No cranial nerve deficit.     Motor: No abnormal muscle tone.     Coordination: Coordination normal.     Gait: Gait normal.     Deep Tendon Reflexes: Reflexes are normal and symmetric. Reflexes normal.  Psychiatric:        Mood and Affect: Mood normal.        Cognition and Memory: Cognition and memory normal.  Assessment & Plan:   Problem List Items Addressed This Visit       Cardiovascular and Mediastinum   Essential hypertension - Primary   bp in fair control at this time  BP Readings from Last 1 Encounters:  04/23/23 138/65   No changes needed Most recent labs reviewed  Disc lifstyle change with low sodium diet and exercise  Plan to continue amlodipine 5 mg  In setting of CAD Continues cardiology care      Relevant Orders   TSH   CAD (coronary artery disease)   Continues cardiology follow up No symptoms  Taking high dose crestor  Working on dm control  Amlodipine 5 mg daily  Asa 81 mg daily        Digestive   Primary adenocarcinoma of ascending colon (HCC)   S/p hemicolectomy  Overall doing well but some diarrhea persists  Has /gI follow up planned and likely colonoscopy in feb Also surgery visit         GERD   Controlled with  Pepcid 20 mg daily  Omeprazole 40 mg daily  Encouraged to avoid triggers   B12 today           Endocrine   Hyperlipidemia associated with type 2 diabetes mellitus (HCC)   Disc goals for lipids and reasons to control them Rev last labs with pt Rev low sat fat diet in detail Now on crestor 40 mg in setting of CAD on CT Seeing cardiology  No complaints       DM (diabetes mellitus), type 2 (HCC)   Due for follow up with endo in 3 weeks  Diet change from colon cancer has caused worsened control  Continues  Metformin xr 1000 mg bid (? If worsening diarrhea)  Farxiga 10 mg daily   Januvia 100 mg daily  May have to add basal insulin Pt is unsure what her A1c was        Other   Vitamin B12 deficiency   B12 level today       Relevant Orders   Vitamin B12   Current use of proton pump inhibitor   B12 level ordered today  Watching bone density and renal fxn

## 2023-04-23 NOTE — Assessment & Plan Note (Signed)
B12 level ordered today  Watching bone density and renal fxn

## 2023-04-23 NOTE — Assessment & Plan Note (Signed)
Continues cardiology follow up No symptoms  Taking high dose crestor  Working on dm control  Amlodipine 5 mg daily  Asa 81 mg daily

## 2023-04-23 NOTE — Patient Instructions (Addendum)
Take care of yourself  Keep up with specialist care   Keep walking  Add some strength training to your routine, this is important for bone and brain health and can reduce your risk of falls and help your body use insulin properly and regulate weight  Light weights, exercise bands , and internet videos are a good way to start  Yoga (chair or regular), machines , floor exercises or a gym with machines are also good options   No change in medicines  B12 level and thyroid screen today

## 2023-04-24 ENCOUNTER — Inpatient Hospital Stay: Payer: Medicare Other | Attending: Nurse Practitioner

## 2023-04-24 ENCOUNTER — Other Ambulatory Visit: Payer: Medicare Other

## 2023-04-24 ENCOUNTER — Telehealth: Payer: Self-pay

## 2023-04-24 DIAGNOSIS — C182 Malignant neoplasm of ascending colon: Secondary | ICD-10-CM

## 2023-04-24 DIAGNOSIS — Z85038 Personal history of other malignant neoplasm of large intestine: Secondary | ICD-10-CM | POA: Diagnosis not present

## 2023-04-24 LAB — CEA (ACCESS): CEA (CHCC): 2.25 ng/mL (ref 0.00–5.00)

## 2023-04-24 NOTE — Telephone Encounter (Signed)
Patient gave verbal understanding had no further questions or concerns. 

## 2023-04-24 NOTE — Telephone Encounter (Signed)
-----   Message from Lonna Cobb sent at 04/24/2023  1:04 PM EST ----- Please let her know CEA is stable in normal range.  Follow-up as scheduled.

## 2023-04-26 MED ORDER — B-12 1000 MCG PO CAPS: 1.0000 | ORAL_CAPSULE | Freq: Every day | ORAL | Status: AC

## 2023-04-26 NOTE — Telephone Encounter (Signed)
Chart updated

## 2023-04-30 ENCOUNTER — Encounter: Payer: Self-pay | Admitting: Internal Medicine

## 2023-04-30 ENCOUNTER — Ambulatory Visit (INDEPENDENT_AMBULATORY_CARE_PROVIDER_SITE_OTHER): Payer: Medicare Other | Admitting: Internal Medicine

## 2023-04-30 VITALS — BP 122/68 | HR 83 | Ht 63.0 in | Wt 113.0 lb

## 2023-04-30 DIAGNOSIS — Z85038 Personal history of other malignant neoplasm of large intestine: Secondary | ICD-10-CM | POA: Diagnosis not present

## 2023-04-30 DIAGNOSIS — R194 Change in bowel habit: Secondary | ICD-10-CM

## 2023-04-30 DIAGNOSIS — Z9049 Acquired absence of other specified parts of digestive tract: Secondary | ICD-10-CM | POA: Diagnosis not present

## 2023-04-30 DIAGNOSIS — R143 Flatulence: Secondary | ICD-10-CM

## 2023-04-30 MED ORDER — SUFLAVE 178.7 G PO SOLR: 1.0000 | Freq: Once | ORAL | 0 refills | Status: AC

## 2023-04-30 NOTE — Progress Notes (Signed)
Donna Murphy y.o. 06/12/1945 [id number]  Assessment & Plan:   Encounter Diagnoses  Name Primary?   Personal history of colon cancer Yes   Flatulence    Altered bowel habits    S/P right hemicolectomy     A surveillance colonoscopy is scheduled.The risks and benefits as well as alternatives of endoscopic procedure(s) have been discussed and reviewed. All questions answered. The patient agrees to proceed.  Regarding her symptoms of flatulence and altered bowel habits she can try Beano and/or Gas-X and I have provided a gas and flatulence reduction diet handout.  Some of the symptoms could be related to the loss of her ileocecal valve.  B12 is low normal.  I would not think she had enough of a resection of the ileum to cause malabsorption.  Agree with oral supplementation as recommended by Dr. Milinda Antis on January 23.  Subjective:   Chief Complaint: History of colon cancer schedule colonoscopy  HPI 79 year old white woman with a history of adenocarcinoma of the ascending colon, stage I discovered at colonoscopy in February 2024.  She is status post resection with a right hemicolectomy.  She says it is taking longer than she thought to recover from that as she says some increased gas which is not a problem other than being embarrassing but she would like relief if possible.  She has reduced nuts in her diet and thinks there is less gas or flatulence.  She takes Metamucil daily, a tablespoon, and reports that helps with regularity.  She will have some episodic loose stools or diarrhea.  It sounds like some of this may have occurred prior to her surgery but it also sounds like it is somewhat worse since then.  She does not related to medication changes.  She has noted some issues with nuts as above but has had a hard time pinpointing other dietary issues or relationships.  She does wonder about the possibility of milk but she has been using almond milk with cereal and has not noted much  difference.  Her endocrinologist tested her for celiac disease and she reports the blood testing was negative.  Colon cancer/polyp history and summary Ascending colon cancer, stage I Colonoscopy 05/10/2022-small ulcerated mass in the ascending colon, invasive moderately differentiated adenocarcinoma Normal CEA CTs 05/23/2022-no evidence of metastatic disease.  Indeterminate potentially enhancing lesion anterior aspect of the upper pole of the left kidney MRI abdomen 06/12/2022-small simple renal cyst.  No suspicious renal masses. Laparoscopic right colectomy 07/07/2022-moderately differentiated adenocarcinoma of the ascending colon, pT2 pN0, 0/27 nodes, no lymphovascular perineural invasion, negative resection margins, no tumor deposits, loss of MLH1 and PMS2 expression, MSI-high, MLH1 hypermethylation present 2.  History of colon polyps-sessile serrated polyp and tubular adenoma in the ascending colon 2017 Colonoscopy November 2019-no polyps  CEA remains normal as of January 21. Thyroid screening January 20 normal, vitamin B12 low normal.  Allergies  Allergen Reactions   Ciprofloxacin     Arm numbness   Sulfonamide Derivatives Hives and Rash   Current Meds  Medication Sig   amLODipine (NORVASC) 5 MG tablet TAKE 1 TABLET (5 MG TOTAL) BY MOUTH DAILY.   Ascorbic Acid (VITAMIN C PO) Take by mouth. 1 tab 2 times per day   aspirin 81 MG tablet Take 81 mg by mouth daily.   cetirizine (ZYRTEC) 10 MG tablet Take 10 mg by mouth daily as needed.   Cholecalciferol (VITAMIN D) 2000 UNITS tablet Take 2,000 Units by mouth daily.   Cyanocobalamin (B-12) 1000  MCG CAPS Take 1 capsule by mouth daily.   famotidine (PEPCID) 20 MG tablet TAKE 1 TABLET BY MOUTH EVERYDAY AT BEDTIME   FARXIGA 10 MG TABS tablet Take 10 mg by mouth daily.   ferrous gluconate (FERGON) 324 MG tablet TAKE 1 TABLET (324 MG TOTAL) BY MOUTH 2 (TWO) TIMES DAILY WITH A MEAL.   folic acid (FOLVITE) 1 MG tablet Take 1 tablet (1 mg total) by  mouth daily.   glucose blood (ONETOUCH VERIO) test strip Ck blood sugar once daily and as directed. Dx E11.9   JANUVIA 100 MG tablet Take 100 mg by mouth daily.   Krill Oil 300 MG CAPS Take 300 mg by mouth daily.   metFORMIN (GLUCOPHAGE-XR) 500 MG 24 hr tablet Take 1,000 mg by mouth 2 (two) times daily.   Multiple Vitamin (MULTIVITAMIN) tablet Take 1 tablet by mouth daily.   omeprazole (PRILOSEC) 40 MG capsule TAKE 1 CAPSULE (40 MG TOTAL) BY MOUTH DAILY.   rosuvastatin (CRESTOR) 40 MG tablet Take 1 tablet (40 mg total) by mouth daily.   Past Medical History:  Diagnosis Date   Allergic rhinitis    Allergy    Anemia    Arthritis    Blood transfusion without reported diagnosis 05/06/2022   DMII (diabetes mellitus, type 2) (HCC)    Dyspnea    due to anemia   Fibroids    uterine   GERD (gastroesophageal reflux disease)    History of kidney stones    HLD (hyperlipidemia)    HTN (hypertension)    Hx of colonic polyps 03/01/2016   Irritable bowel syndrome    Skin cancer of lip    basal cell   Varices of other sites    Past Surgical History:  Procedure Laterality Date   COLONOSCOPY  12/2005   negative - Grigor Lipschutz   COLONOSCOPY WITH ESOPHAGOGASTRODUODENOSCOPY (EGD)  05/10/2022   ENDOMETRIAL BIOPSY  01/2000   negative   EYE SURGERY  12/2022   LAPAROSCOPIC PARTIAL RIGHT COLECTOMY Right 07/07/2022   Procedure: LAPAROSCOPIC RIGHT HEMICOLECTOMY;  Surgeon: Andria Meuse, MD;  Location: [facility];  Service: General;  Laterality: Right;   TONSILLECTOMY     TOOTH EXTRACTION     Social History   Social History Narrative   Widowed, 2 sons 1 daughter.  1 son is married to an anesthesiologist.   Retired.   Never smoker   Alcohol 1 beverage 3 times a week   2 caffeinated beverages daily   No other tobacco no drug use   Walks for exercise; does yardwork too   family history includes Brain cancer in her sister; Coronary artery disease in her mother; Diabetes in her father and unknown  relative; Heart attack in her mother; Heart failure in her father; Heart murmur in her son; Hyperlipidemia in her mother; Hypertension in her brother, father, and mother; Osteoporosis in her mother; Transient ischemic attack in her father.   Review of Systems As per HPI  Objective:   Physical Exam @BP  122/68 (BP Location: Left Arm, Patient Position: Sitting, Cuff Size: Normal)   Pulse 83   Ht 5\' 3"  (1.6 m)   Wt 113 lb (51.3 kg)   SpO2 96%   BMI 20.02 kg/m @  General:  NAD petite white woman Eyes:   anicteric Lungs:  clear Heart::  S1S2 no rubs, murmurs or gallops Abdomen:  soft and nontender, BS+ thin Ext:   no edema, cyanosis or clubbing    Data Reviewed:  See HPI

## 2023-04-30 NOTE — Patient Instructions (Signed)
You have been scheduled for a colonoscopy. Please follow written instructions given to you at your visit today.   Please pick up your prep supplies at the pharmacy within the next 1-3 days.  If you use inhalers (even only as needed), please bring them with you on the day of your procedure.  DO NOT TAKE 7 DAYS PRIOR TO TEST- Trulicity (dulaglutide) Ozempic, Wegovy (semaglutide) Mounjaro (tirzepatide) Bydureon Bcise (exanatide extended release)  DO NOT TAKE 1 DAY PRIOR TO YOUR TEST Rybelsus (semaglutide) Adlyxin (lixisenatide) Victoza (liraglutide) Byetta (exanatide) ___________________________________________________________________________  Try over the counter Beano or Gas-X.  We are providing you with a Gas diet handout to read.  I appreciate the opportunity to care for you. Stan Head, MD, [facility]

## 2023-05-02 ENCOUNTER — Other Ambulatory Visit: Payer: Self-pay | Admitting: *Deleted

## 2023-05-08 ENCOUNTER — Telehealth: Payer: Self-pay | Admitting: Internal Medicine

## 2023-05-08 NOTE — Telephone Encounter (Signed)
Inbound call from patient requesting a call regarding concerns of medications that she is currently taking before 3/10 colonoscopy. Please advise, thank you.

## 2023-05-08 NOTE — Telephone Encounter (Signed)
We went over the diabetic medicine instructions . When her Suflave kit arrived it said to contact us about those medicines. She verbalized understanding to hold those the AM of the procedure.

## 2023-05-21 ENCOUNTER — Other Ambulatory Visit: Payer: Self-pay

## 2023-05-21 MED ORDER — ROSUVASTATIN CALCIUM 40 MG PO TABS: 40.0000 mg | ORAL_TABLET | Freq: Every day | ORAL | 2 refills | Status: DC

## 2023-05-25 DIAGNOSIS — D649 Anemia, unspecified: Secondary | ICD-10-CM | POA: Diagnosis not present

## 2023-06-01 DIAGNOSIS — K219 Gastro-esophageal reflux disease without esophagitis: Secondary | ICD-10-CM | POA: Diagnosis not present

## 2023-06-01 DIAGNOSIS — C189 Malignant neoplasm of colon, unspecified: Secondary | ICD-10-CM | POA: Diagnosis not present

## 2023-06-01 DIAGNOSIS — E1165 Type 2 diabetes mellitus with hyperglycemia: Secondary | ICD-10-CM | POA: Diagnosis not present

## 2023-06-01 DIAGNOSIS — R634 Abnormal weight loss: Secondary | ICD-10-CM | POA: Diagnosis not present

## 2023-06-01 DIAGNOSIS — I10 Essential (primary) hypertension: Secondary | ICD-10-CM | POA: Diagnosis not present

## 2023-06-01 DIAGNOSIS — D649 Anemia, unspecified: Secondary | ICD-10-CM | POA: Diagnosis not present

## 2023-06-01 DIAGNOSIS — E7800 Pure hypercholesterolemia, unspecified: Secondary | ICD-10-CM | POA: Diagnosis not present

## 2023-06-09 ENCOUNTER — Telehealth: Payer: Self-pay | Admitting: Gastroenterology

## 2023-06-09 NOTE — Telephone Encounter (Signed)
 Patient called today as she was reading her ingredients for her Suflave in preparation for a colonoscopy coming up.  She noted Suflave has sulfate and the ingredients and she is concerned that she has an allergic reaction to sulfa drugs.  On review of last colonoscopy she had a MiraLAX prep and had excellent prep.  Patient would prefer MiraLAX prep.  She does not want to take Dulcolax as it "gives her diarrhea for days after the procedure".  Discussed that Dulcolax typically results in diarrhea especially during prep.  Ultimately joint decision was made to proceed with MiraLAX prep without the Dulcolax tablets but she will do extra MiraLAX to make up for it.  She is advised that she must have clear/yellow stools in order to be considered adequately cleaned out for her colonoscopy.  She is also advised to call back on-call service if she has any questions or concerns.  Will be sending MiraLAX instructions through a MyChart message for her.

## 2023-06-11 ENCOUNTER — Encounter: Payer: Self-pay | Admitting: Internal Medicine

## 2023-06-11 ENCOUNTER — Ambulatory Visit (AMBULATORY_SURGERY_CENTER): Payer: Medicare Other | Admitting: Internal Medicine

## 2023-06-11 VITALS — BP 118/68 | HR 67 | Temp 97.1°F | Resp 21 | Ht 63.0 in | Wt 113.0 lb

## 2023-06-11 DIAGNOSIS — E119 Type 2 diabetes mellitus without complications: Secondary | ICD-10-CM | POA: Diagnosis not present

## 2023-06-11 DIAGNOSIS — Z980 Intestinal bypass and anastomosis status: Secondary | ICD-10-CM

## 2023-06-11 DIAGNOSIS — K5730 Diverticulosis of large intestine without perforation or abscess without bleeding: Secondary | ICD-10-CM | POA: Diagnosis not present

## 2023-06-11 DIAGNOSIS — Z85038 Personal history of other malignant neoplasm of large intestine: Secondary | ICD-10-CM

## 2023-06-11 DIAGNOSIS — Z1211 Encounter for screening for malignant neoplasm of colon: Secondary | ICD-10-CM | POA: Diagnosis not present

## 2023-06-11 DIAGNOSIS — I10 Essential (primary) hypertension: Secondary | ICD-10-CM | POA: Diagnosis not present

## 2023-06-11 MED ORDER — SODIUM CHLORIDE 0.9 % IV SOLN: 500.0000 mL | INTRAVENOUS | Status: DC

## 2023-06-11 NOTE — Op Note (Signed)
 [facility] Patient Name: Donna Murphy Procedure Date: 06/11/2023 2:50 PM MRN: [id number] Endoscopist: Iva Boop , MD, [id number] Age: 78 Referring MD:  Date of Birth: 09-17-1945 Gender: Female Account #: [id number] Procedure:                Colonoscopy Indications:              High risk colon cancer surveillance: Personal                            history of colon cancer, Last colonoscopy: February                            2024 Medicines:                Monitored Anesthesia Care Procedure:                Pre-Anesthesia Assessment:                           - Prior to the procedure, a History and Physical                            was performed, and patient medications and                            allergies were reviewed. The patient's tolerance of                            previous anesthesia was also reviewed. The risks                            and benefits of the procedure and the sedation                            options and risks were discussed with the patient.                            All questions were answered, and informed consent                            was obtained. Prior Anticoagulants: The patient has                            taken no anticoagulant or antiplatelet agents. ASA                            Grade Assessment: III - A patient with severe                            systemic disease. After reviewing the risks and                            benefits, the patient was deemed in satisfactory  condition to undergo the procedure.                           After obtaining informed consent, the colonoscope                            was passed under direct vision. Throughout the                            procedure, the patient's blood pressure, pulse, and                            oxygen saturations were monitored continuously. The                            CF [id number] #[id number] was introduced through the anus                             and advanced to the the ileocolonic anastomosis.                            The colonoscopy was performed without difficulty.                            The patient tolerated the procedure well. The                            quality of the bowel preparation was good. The                            ileocecal valve, appendiceal orifice, and rectum                            were photographed. The bowel preparation used was                            Miralax via single dose instruction. Scope In: 3:24:39 PM Scope Out: 3:36:39 PM Scope Withdrawal Time: 0 hours 8 minutes 18 seconds  Total Procedure Duration: 0 hours 12 minutes 0 seconds  Findings:                 The perianal and digital rectal examinations were                            normal.                           Multiple diverticula were found in the sigmoid                            colon and descending colon. There was narrowing of                            the colon in association with the diverticular  opening.                           There was evidence of a prior end-to-side                            ileo-colonic anastomosis in the transverse colon.                            This was patent and was characterized by healthy                            appearing mucosa.                           The exam was otherwise without abnormality on                            direct and retroflexion views. Complications:            No immediate complications. Estimated Blood Loss:     Estimated blood loss: none. Impression:               - Severe diverticulosis in the sigmoid colon and in                            the descending colon. There was narrowing of the                            colon in association with the diverticular opening.                           - Patent end-to-side ileo-colonic anastomosis,                            characterized by healthy appearing mucosa.                            - The examination was otherwise normal on direct                            and retroflexion views.                           - No specimens collected.                           - Personal history of malignant neoplasm of the                            colon. Ascending colon cancer dx 2/24 and resected                            4/24. Recommendation:           - Patient has a contact number available for  emergencies. The signs and symptoms of potential                            delayed complications were discussed with the                            patient. Return to normal activities tomorrow.                            Written discharge instructions were provided to the                            patient.                           - Continue present medications.                           - Repeat colonoscopy in 3 years for surveillance.                           - Has had weight loss - try to increase dietary                            protein to at least 50 g daily and hoepfully more.                           - Continue Beano and Gas-Ex Iva Boop, MD 06/11/2023 3:55:27 PM This report has been signed electronically.

## 2023-06-11 NOTE — Progress Notes (Signed)
 Pt sedate, gd SR's, VSS, report to RN

## 2023-06-11 NOTE — Progress Notes (Signed)
 [facility] History and Physical   Primary Care Physician:  Tower, Audrie Gallus, MD   Reason for Procedure:    Encounter Diagnosis  Name Primary?   Personal history of colon cancer Yes     Plan:    colonoscopy     HPI: Donna Murphy is a 78 y.o. female here for a surveillance colonoscopy s/p resection of colon cancer last year   Past Medical History:  Diagnosis Date   Allergic rhinitis    Allergy    Anemia    Arthritis    Blood transfusion without reported diagnosis 05/06/2022   DMII (diabetes mellitus, type 2) (HCC)    Dyspnea    due to anemia   Fibroids    uterine   GERD (gastroesophageal reflux disease)    History of kidney stones    HLD (hyperlipidemia)    HTN (hypertension)    Hx of colonic polyps 03/01/2016   Irritable bowel syndrome    Skin cancer of lip    basal cell   Varices of other sites     Past Surgical History:  Procedure Laterality Date   COLONOSCOPY  12/2005   negative - Donna Murphy   COLONOSCOPY WITH ESOPHAGOGASTRODUODENOSCOPY (EGD)  05/10/2022   ENDOMETRIAL BIOPSY  01/2000   negative   EYE SURGERY  12/2022   LAPAROSCOPIC PARTIAL RIGHT COLECTOMY Right 07/07/2022   Procedure: LAPAROSCOPIC RIGHT HEMICOLECTOMY;  Surgeon: Andria Meuse, MD;  Location: [facility];  Service: General;  Laterality: Right;   TONSILLECTOMY     TOOTH EXTRACTION      Prior to Admission medications   Medication Sig Start Date End Date Taking? Authorizing Provider  amLODipine (NORVASC) 5 MG tablet TAKE 1 TABLET (5 MG TOTAL) BY MOUTH DAILY. 04/18/23  Yes Tower, Audrie Gallus, MD  aspirin 81 MG tablet Take 81 mg by mouth daily.   Yes [provider]  cetirizine (ZYRTEC) 10 MG tablet Take 10 mg by mouth daily as needed.   Yes [provider]  Cholecalciferol (VITAMIN D) 2000 UNITS tablet Take 2,000 Units by mouth daily.   Yes [provider]  Cyanocobalamin (B-12) 1000 MCG CAPS Take 1 capsule by mouth daily. 04/26/23  Yes Tower, Audrie Gallus, MD   famotidine (PEPCID) 20 MG tablet TAKE 1 TABLET BY MOUTH EVERYDAY AT BEDTIME 04/18/23  Yes Tower, Marne A, MD  FARXIGA 10 MG TABS tablet Take 10 mg by mouth daily. 05/07/17  Yes [provider]  folic acid (FOLVITE) 1 MG tablet Take 1 tablet (1 mg total) by mouth daily. 04/18/23  Yes Tower, Marne A, MD  glucose blood (ONETOUCH VERIO) test strip Ck blood sugar once daily and as directed. Dx E11.9 08/11/15  Yes Tower, Audrie Gallus, MD  JANUVIA 100 MG tablet Take 100 mg by mouth daily. 08/23/16  Yes [provider]  Boris Lown Oil 300 MG CAPS Take 300 mg by mouth daily.   Yes [provider]  metFORMIN (GLUCOPHAGE-XR) 500 MG 24 hr tablet Take 1,000 mg by mouth 2 (two) times daily. 06/07/22  Yes [provider]  Multiple Vitamin (MULTIVITAMIN) tablet Take 1 tablet by mouth daily.   Yes [provider]  omeprazole (PRILOSEC) 40 MG capsule TAKE 1 CAPSULE (40 MG TOTAL) BY MOUTH DAILY. 04/18/23  Yes Tower, Audrie Gallus, MD  rosuvastatin (CRESTOR) 40 MG tablet Take 1 tablet (40 mg total) by mouth daily. 05/21/23  Yes Jake Bathe, MD    Current Outpatient Medications  Medication Sig Dispense Refill  amLODipine (NORVASC) 5 MG tablet TAKE 1 TABLET (5 MG TOTAL) BY MOUTH DAILY. 90 tablet 0   aspirin 81 MG tablet Take 81 mg by mouth daily.     cetirizine (ZYRTEC) 10 MG tablet Take 10 mg by mouth daily as needed.     Cholecalciferol (VITAMIN D) 2000 UNITS tablet Take 2,000 Units by mouth daily.     Cyanocobalamin (B-12) 1000 MCG CAPS Take 1 capsule by mouth daily.     famotidine (PEPCID) 20 MG tablet TAKE 1 TABLET BY MOUTH EVERYDAY AT BEDTIME 90 tablet 0   FARXIGA 10 MG TABS tablet Take 10 mg by mouth daily.  6   folic acid (FOLVITE) 1 MG tablet Take 1 tablet (1 mg total) by mouth daily. 90 tablet 0   glucose blood (ONETOUCH VERIO) test strip Ck blood sugar once daily and as directed. Dx E11.9 100 each 1   JANUVIA 100 MG tablet Take 100 mg by mouth daily.  5   Krill Oil 300 MG CAPS  Take 300 mg by mouth daily.     metFORMIN (GLUCOPHAGE-XR) 500 MG 24 hr tablet Take 1,000 mg by mouth 2 (two) times daily.     Multiple Vitamin (MULTIVITAMIN) tablet Take 1 tablet by mouth daily.     omeprazole (PRILOSEC) 40 MG capsule TAKE 1 CAPSULE (40 MG TOTAL) BY MOUTH DAILY. 90 capsule 0   rosuvastatin (CRESTOR) 40 MG tablet Take 1 tablet (40 mg total) by mouth daily. 90 tablet 2   Current Facility-Administered Medications  Medication Dose Route Frequency Provider Last Rate Last Admin   0.9 %  sodium chloride infusion  500 mL Intravenous Continuous Iva Boop, MD        Allergies as of 06/11/2023 - Review Complete 06/11/2023  Allergen Reaction Noted   Ciprofloxacin Other (See Comments)    Sulfonamide derivatives Hives and Rash     Family History  Problem Relation Age of Onset   Coronary artery disease Mother    Hyperlipidemia Mother    Hypertension Mother    Osteoporosis Mother    Heart attack Mother        CABG in her 49's   Diabetes Father    Heart failure Father    Hypertension Father    Transient ischemic attack Father    Brain cancer Sister    Hypertension Brother    Uterine cancer Paternal Grandmother    Heart murmur Son    Diabetes Other        GF   Colon cancer Neg Hx    Esophageal cancer Neg Hx    Stomach cancer Neg Hx    Rectal cancer Neg Hx     Social History   Socioeconomic History   Marital status: Widowed    Spouse name: Not on file   Number of children: 3   Years of education: Not on file   Highest education level: Bachelor's degree (e.g., BA, AB, BS)  Occupational History   Occupation: retired  Tobacco Use   Smoking status: Never   Smokeless tobacco: Never  Vaping Use   Vaping status: Never Used  Substance and Sexual Activity   Alcohol use: Yes    Alcohol/week: 7.0 - 10.0 standard drinks of alcohol    Types: 7 - 10 Glasses of wine per week    Comment: a glass a wine 3 times a week   Drug use: No   Sexual activity: Yes    Birth  control/protection: Post-menopausal  Other Topics Concern  Not on file  Social History Narrative   Widowed, 2 sons 1 daughter.  1 son is married to an anesthesiologist.   Retired.   Never smoker   Alcohol 1 beverage 3 times a week   2 caffeinated beverages daily   No other tobacco no drug use   Walks for exercise; does yardwork too   Social Drivers of Corporate investment banker Strain: Low Risk  (04/20/2023)   Overall Financial Resource Strain (CARDIA)    Difficulty of Paying Living Expenses: Not hard at all  Food Insecurity: No Food Insecurity (04/20/2023)   Hunger Vital Sign    Worried About Running Out of Food in the Last Year: Never true    Ran Out of Food in the Last Year: Never true  Transportation Needs: No Transportation Needs (04/20/2023)   PRAPARE - Administrator, Civil Service (Medical): No    Lack of Transportation (Non-Medical): No  Physical Activity: Sufficiently Active (04/20/2023)   Exercise Vital Sign    Days of Exercise per Week: 6 days    Minutes of Exercise per Session: 60 min  Stress: No Stress Concern Present (04/20/2023)   [employer] of Occupational Health - Occupational Stress Questionnaire    Feeling of Stress : Only a little  Social Connections: Moderately Integrated (04/20/2023)   Social Connection and Isolation Panel [NHANES]    Frequency of Communication with Friends and Family: Twice a week    Frequency of Social Gatherings with Friends and Family: Twice a week    Attends Religious Services: More than 4 times per year    Active Member of [employer] or Organizations: Yes    Attends Banker Meetings: Not on file    Marital Status: Widowed  Intimate Partner Violence: Not At Risk (07/07/2022)   Humiliation, Afraid, Rape, and Kick questionnaire    Fear of Current or Ex-Partner: No    Emotionally Abused: No    Physically Abused: No    Sexually Abused: No    Review of Systems:  All other review of systems negative except as  mentioned in the HPI.  Physical Exam: Vital signs BP 139/74   Pulse 79   Temp (!) 97.1 F (36.2 C)   Ht 5\' 3"  (1.6 m)   Wt 113 lb (51.3 kg)   SpO2 98%   BMI 20.02 kg/m   General:   Alert,  Well-developed, well-nourished, pleasant and cooperative in NAD Lungs:  Clear throughout to auscultation.   Heart:  Regular rate and rhythm; no murmurs, clicks, rubs,  or gallops. Abdomen:  Soft, nontender and nondistended. Normal bowel sounds.   Neuro/Psych:  Alert and cooperative. Normal mood and affect. A and O x 3   @Brentley Horrell  Sena Slate, MD, [facility] [phone] (pager) 06/11/2023 3:09 PM@

## 2023-06-11 NOTE — Progress Notes (Signed)
 15:20 - 22g iv started in left hand x1 attempt after original iv infiltrated in RAC space

## 2023-06-11 NOTE — Patient Instructions (Addendum)
 Please read handouts provided. Continue present medications.  YOU HAD AN ENDOSCOPIC PROCEDURE TODAY AT THE [facility]:   Refer to the procedure report that was given to you for any specific questions about what was found during the examination.  If the procedure report does not answer your questions, please call your gastroenterologist to clarify.  If you requested that your care partner not be given the details of your procedure findings, then the procedure report has been included in a sealed envelope for you to review at your convenience later.  YOU SHOULD EXPECT: Some feelings of bloating in the abdomen. Passage of more gas than usual.  Walking can help get rid of the air that was put into your GI tract during the procedure and reduce the bloating. If you had a lower endoscopy (such as a colonoscopy or flexible sigmoidoscopy) you may notice spotting of blood in your stool or on the toilet paper. If you underwent a bowel prep for your procedure, you may not have a normal bowel movement for a few days.  Please Note:  You might notice some irritation and congestion in your nose or some drainage.  This is from the oxygen used during your procedure.  There is no need for concern and it should clear up in a day or so.  SYMPTOMS TO REPORT IMMEDIATELY:  Following lower endoscopy (colonoscopy or flexible sigmoidoscopy):  Excessive amounts of blood in the stool  Significant tenderness or worsening of abdominal pains  Swelling of the abdomen that is new, acute  Fever of 100F or higher.  For urgent or emergent issues, a gastroenterologist can be reached at any hour by calling [phone]. Do not use MyChart messaging for urgent concerns.    DIET:  We do recommend a small meal at first, but then you may proceed to your regular diet.  Drink plenty of fluids but you should avoid alcoholic beverages for 24 hours.  ACTIVITY:  You should plan to take it easy for the rest of today and you  should NOT DRIVE or use heavy machinery until tomorrow (because of the sedation medicines used during the test).    FOLLOW UP: Our staff will call the number listed on your records the next business day following your procedure.  We will call around 7:15- 8:00 am to check on you and address any questions or concerns that you may have regarding the information given to you following your procedure. If we do not reach you, we will leave a message.     If any biopsies were taken you will be contacted by phone or by letter within the next 1-3 weeks.  Please call us at [phone] if you have not heard about the biopsies in 3 weeks.    SIGNATURES/CONFIDENTIALITY: You and/or your care partner have signed paperwork which will be entered into your electronic medical record.  These signatures attest to the fact that that the information above on your After Visit Summary has been reviewed and is understood.  Full responsibility of the confidentiality of this discharge information lies with you and/or your care-partner.No polyps or cancer were seen today.  You still have diverticulosis - thickened muscle rings and pouches in the colon wall. Please read the handout about this condition.  Your next routine colonoscopy should be in 3 years - 2028.  Continue Beano and Gas-Ex.  Try to increase protein in your diet. You should get at least 50 grams a day and I would aim for  65-70 grams a day.  Sulfate in a medication or other product should not trigger a sulfa allergy.  I appreciate the opportunity to care for you. Iva Boop, MD, [facility]

## 2023-06-11 NOTE — Progress Notes (Signed)
 Patient states there have been no changes to medical or surgical history since time of pre-visit.

## 2023-06-12 ENCOUNTER — Telehealth: Payer: Self-pay | Admitting: *Deleted

## 2023-06-12 NOTE — Telephone Encounter (Signed)
 Attempted post procedure follow up call.  No answer - LVM.

## 2023-06-21 ENCOUNTER — Ambulatory Visit: Payer: Medicare Other | Attending: Cardiology | Admitting: Cardiology

## 2023-06-21 ENCOUNTER — Encounter: Payer: Self-pay | Admitting: Cardiology

## 2023-06-21 VITALS — BP 108/72 | HR 85 | Ht 63.5 in | Wt 110.8 lb

## 2023-06-21 DIAGNOSIS — I700 Atherosclerosis of aorta: Secondary | ICD-10-CM | POA: Diagnosis not present

## 2023-06-21 DIAGNOSIS — I10 Essential (primary) hypertension: Secondary | ICD-10-CM | POA: Diagnosis not present

## 2023-06-21 DIAGNOSIS — E119 Type 2 diabetes mellitus without complications: Secondary | ICD-10-CM

## 2023-06-21 DIAGNOSIS — I2510 Atherosclerotic heart disease of native coronary artery without angina pectoris: Secondary | ICD-10-CM

## 2023-06-21 NOTE — Progress Notes (Signed)
 Cardiology Office Note:  .   Date:  06/21/2023  ID:  Donna Murphy, DOB 1945/08/16, MRN [id number] PCP: Judy Pimple, MD   HeartCare Providers Cardiologist:  Donato Schultz, MD     History of Present Illness: .   Donna Murphy is a 78 y.o. female Discussed the use of AI scribe software for clinical note transcription with the patient, who gave verbal consent to proceed.  History of Present Illness Donna Murphy is a 78 year old female with coronary artery disease who presents for follow-up.  She has extensive nonobstructive coronary artery disease with a coronary calcium score of 1042, placing her in the 94th percentile. A CT FFR showed no hemodynamically significant lesions. She is asymptomatic with no chest pain, shortness of breath, or dizziness. She takes aspirin 81 mg daily and rosuvastatin 40 mg daily, which have successfully brought her LDL to goal levels.  She manages type 2 diabetes with Farxiga 10 mg daily, Januvia, and metformin. Her last hemoglobin A1c was 7.5%.  For hyperlipidemia, she is on rosuvastatin 40 mg daily, with a prior LDL level of 50, which is at goal.  She is on a low dose of amlodipine for hypertension. Recent weight loss has been noted, and there is consideration for discontinuing amlodipine if her blood pressure becomes too low.  She has a history of colon cancer, for which she underwent a partial colectomy. She had a recent colonoscopy and does not need another for three years. She has experienced significant weight loss, requiring a new wardrobe, and is now lifting weights three times a week and walking three miles a day, five to six days a week. Her BMI is around 20-21, and she is drinking protein shakes to maintain her weight.  Family history includes cholesterol issues, similar to her brother and mother. She recalls having stomach issues since childhood, previously diagnosed as colitis, requiring weekly shots.      Studies Reviewed: [name]   EKG  Interpretation Date/Time:  Thursday June 21 2023 09:46:05 EDT Ventricular Rate:  85 PR Interval:  138 QRS Duration:  76 QT Interval:  374 QTC Calculation: 445 R Axis:   79  Text Interpretation: Normal sinus rhythm When compared with ECG of 05-May-2022 12:27, No significant change since last tracing Confirmed by Donato Schultz ([id number]) on 06/21/2023 10:05:07 AM    Results LABS HbA1c: 7.5% LDL: 50 mg/dL ALT: 32 U/L  RADIOLOGY Coronary calcium score: 1042, 94th percentile CT FFR: No hemodynamically significant lesions  DIAGNOSTIC Colonoscopy: Surveillance, polyps found Risk Assessment/Calculations:            Physical Exam:   VS:  BP 108/72   Pulse 85   Ht 5' 3.5" (1.613 m)   Wt 110 lb 12.8 oz (50.3 kg)   SpO2 97%   BMI 19.32 kg/m    Wt Readings from Last 3 Encounters:  06/21/23 110 lb 12.8 oz (50.3 kg)  06/11/23 113 lb (51.3 kg)  04/30/23 113 lb (51.3 kg)    GEN: Well nourished, well developed in no acute distress NECK: No JVD; No carotid bruits CARDIAC: RRR, no murmurs, no rubs, no gallops RESPIRATORY:  Clear to auscultation without rales, wheezing or rhonchi  ABDOMEN: Soft, non-tender, non-distended EXTREMITIES:  No edema; No deformity   ASSESSMENT AND PLAN: .    Assessment and Plan Assessment & Plan Coronary artery disease Extensive nonobstructive coronary artery disease with a coronary calcium score of 1042, 94th percentile. CT FFR showed no hemodynamically significant lesions.  Asymptomatic with no chest pain or dyspnea. Treatment focuses on prevention and stabilization of plaque to prevent myocardial infarction. Aspirin and rosuvastatin are effective in maintaining cholesterol levels at goal. Medications reduce soft plaque and prevent inflammation that could lead to plaque rupture and myocardial infarction. Calcification is permanent, but soft plaque can be reduced with medication and lifestyle changes. - Continue aspirin 81 mg oral daily - Continue rosuvastatin  40 mg oral daily - Continue amlodipine 5 mg oral daily - Monitor for symptoms such as dizziness or hypotension - Schedule follow-up in two years unless symptoms change  Hyperlipidemia Hyperlipidemia well-controlled with rosuvastatin, achieving an LDL level of 50, at goal. Treatment plan is to maintain current therapy to prevent cardiovascular events. - Continue rosuvastatin 40 mg oral daily  Hypertension Blood pressure well-controlled with amlodipine, currently at 108/72 mmHg. Advised monitoring for symptoms of hypotension due to weight loss, which may necessitate discontinuation of amlodipine. - Continue amlodipine 5 mg oral daily - Monitor blood pressure regularly - Discontinue amlodipine if symptomatic hypotension occurs  Type 2 diabetes mellitus Type 2 diabetes managed with Marcelline Deist, Januvia, and metformin. Last hemoglobin A1c was 7.5%. Treatment aims to maintain glycemic control and prevent complications. - Continue Farxiga 10 mg oral daily - Continue Januvia 100 mg oral daily - Continue metformin 500 mg oral twice daily  Colon cancer with partial colectomy Colon cancer with partial colectomy. Recently had a colonoscopy and does not need another for three years. Experiencing weight loss and working on maintaining weight with protein shakes and exercise. BMI 19 - Continue current dietary and exercise regimen to maintain weight - Monitor weight and nutritional intake         Signed, Donato Schultz, MD

## 2023-06-21 NOTE — Patient Instructions (Signed)
 Medication Instructions:  The current medical regimen is effective;  continue present plan and medications.  *If you need a refill on your cardiac medications before your next appointment, please call your pharmacy*  Follow-Up: At [facility], you and your health needs are our priority.  As part of our continuing mission to provide you with exceptional heart care, we have created designated Provider Care Teams.  These Care Teams include your primary Cardiologist (physician) and Advanced Practice Providers (APPs -  Physician Assistants and Nurse Practitioners) who all work together to provide you with the care you need, when you need it.  We recommend signing up for the patient portal called "MyChart".  Sign up information is provided on this After Visit Summary.  MyChart is used to connect with patients for Virtual Visits (Telemedicine).  Patients are able to view lab/test results, encounter notes, upcoming appointments, etc.  Non-urgent messages can be sent to your provider as well.   To learn more about what you can do with MyChart, go to [email or web address].    Your next appointment:   2 year(s)  Provider:   Donato Schultz, MD          1st Floor: - Lobby - Registration  - Pharmacy  - Lab - Cafe  2nd Floor: - PV Lab - Diagnostic Testing (echo, CT, nuclear med)  3rd Floor: - Vacant  4th Floor: - TCTS (cardiothoracic surgery) - AFib Clinic - Structural Heart Clinic - Vascular Surgery  - Vascular Ultrasound  5th Floor: - [facility] (general and EP) - Clinical Pharmacy for coumadin, hypertension, lipid, weight-loss medications, and med management appointments    Valet parking services will be available as well.

## 2023-07-14 ENCOUNTER — Other Ambulatory Visit: Payer: Self-pay | Admitting: Family Medicine

## 2023-07-27 ENCOUNTER — Telehealth: Payer: Self-pay | Admitting: *Deleted

## 2023-07-27 NOTE — Telephone Encounter (Signed)
 Copied from CRM [id number]. Topic: General - Other >> Jul 27, 2023  9:38 AM Caliyah H wrote: Reason for CRM: Patient called this morning to inform the office that she has changed her insurance and will no longer be using [facility] due to coverage limitations. She has updated her preferred pharmacy to the following for all future prescription refills and medication needs:  [facility] [id number] [address] [place], [place] [address] Phone: [phone] Fax: [phone] Note: Not open 24 hours  Callback Number: [id number]

## 2023-07-27 NOTE — Telephone Encounter (Signed)
Chart has been updated with new pharmacy.

## 2023-07-30 ENCOUNTER — Inpatient Hospital Stay: Payer: Self-pay

## 2023-07-30 ENCOUNTER — Other Ambulatory Visit: Payer: Medicare Other

## 2023-07-30 ENCOUNTER — Inpatient Hospital Stay: Payer: Medicare Other | Attending: Oncology | Admitting: Oncology

## 2023-07-30 VITALS — BP 135/75 | HR 80 | Temp 98.1°F | Resp 18 | Ht 63.5 in | Wt 113.5 lb

## 2023-07-30 DIAGNOSIS — I10 Essential (primary) hypertension: Secondary | ICD-10-CM | POA: Insufficient documentation

## 2023-07-30 DIAGNOSIS — K869 Disease of pancreas, unspecified: Secondary | ICD-10-CM | POA: Diagnosis not present

## 2023-07-30 DIAGNOSIS — D509 Iron deficiency anemia, unspecified: Secondary | ICD-10-CM | POA: Insufficient documentation

## 2023-07-30 DIAGNOSIS — Z85038 Personal history of other malignant neoplasm of large intestine: Secondary | ICD-10-CM | POA: Diagnosis not present

## 2023-07-30 DIAGNOSIS — K219 Gastro-esophageal reflux disease without esophagitis: Secondary | ICD-10-CM | POA: Diagnosis not present

## 2023-07-30 DIAGNOSIS — Z860100 Personal history of colon polyps, unspecified: Secondary | ICD-10-CM | POA: Diagnosis not present

## 2023-07-30 DIAGNOSIS — C182 Malignant neoplasm of ascending colon: Secondary | ICD-10-CM

## 2023-07-30 DIAGNOSIS — E119 Type 2 diabetes mellitus without complications: Secondary | ICD-10-CM | POA: Insufficient documentation

## 2023-07-30 DIAGNOSIS — I700 Atherosclerosis of aorta: Secondary | ICD-10-CM | POA: Diagnosis not present

## 2023-07-30 LAB — CEA (ACCESS): CEA (CHCC): 2.21 ng/mL (ref 0.00–5.00)

## 2023-07-30 NOTE — Progress Notes (Signed)
  [facility] OFFICE PROGRESS NOTE   Diagnosis: Colon cancer  INTERVAL HISTORY:   Ms. Myrick turns as scheduled.  She has diarrhea with certain foods.  No bleeding.  She underwent a colonoscopy 06/11/2023.  There is left-sided diverticulosis.  A 3-year follow-up colonoscopy is recommended.  Objective:  Vital signs in last 24 hours:  Blood pressure 135/75, pulse 80, temperature 98.1 F (36.7 C), temperature source Temporal, resp. rate 18, height 5' 3.5" (1.613 m), weight 113 lb 8 oz (51.5 kg), SpO2 99%.     Lymphatics: No cervical, supraclavicular, axillary, or inguinal nodes Resp: Lungs clear bilaterally Cardio: Regular rate and rhythm GI: No hepatosplenomegaly, no mass, nontender Vascular: No leg edema   Lab Results:  Lab Results  Component Value Date   WBC 7.6 12/20/2022   HGB 14.1 12/20/2022   HCT 44.7 12/20/2022   MCV 87 12/20/2022   PLT 331 12/20/2022   NEUTROABS 4.7 09/27/2022    CMP  Lab Results  Component Value Date   NA 141 12/22/2022   K 3.9 12/22/2022   CL 102 12/22/2022   CO2 24 12/22/2022   GLUCOSE 169 (H) 12/22/2022   BUN 13 12/22/2022   CREATININE 0.69 12/22/2022   CALCIUM  10.0 12/22/2022   PROT 7.4 06/26/2022   ALBUMIN 4.1 06/26/2022   AST 43 (H) 06/26/2022   ALT 45 (H) 06/26/2022   ALKPHOS 67 06/26/2022   BILITOT 0.3 06/26/2022   GFRNONAA >60 07/09/2022   GFRAA 131 02/13/2008    Lab Results  Component Value Date   CEA 2.21 07/30/2023   Medications: I have reviewed the patient's current medications.   Assessment/Plan:  Ascending colon cancer, stage I Colonoscopy 05/10/2022-small ulcerated mass in the ascending colon, invasive moderately differentiated adenocarcinoma Normal CEA CTs 05/23/2022-no evidence of metastatic disease.  Indeterminate potentially enhancing lesion anterior aspect of the upper pole of the left kidney MRI abdomen 06/12/2022-small simple renal cyst.  No suspicious renal masses. Laparoscopic right  colectomy 07/07/2022-moderately differentiated adenocarcinoma of the ascending colon, pT2 pN0, 0/27 nodes, no lymphovascular perineural invasion, negative resection margins, no tumor deposits, loss of MLH1 and PMS2 expression, MSI-high, MLH1 hypermethylation present 2.  History of colon polyps-sessile serrated polyp and tubular adenoma in the ascending colon 2017 Colonoscopy November 2019-no polyps Colonoscopy 06/11/2023-left-sided diverticulosis, no polyps 3.  Type 2 diabetes 4.  Gastroesophageal reflux disease 5.   Seasonal allergies 6.   Diarrhea illness, colitis?  As a child 7.   IBS 8.   Hypertension 9.   Aortic atherosclerosis, calcifications of the aortic valve on the CT scan from 05/23/2022.  She has been referred to cardiology. 10.  Tiny nonaggressive nonenhancing 0.3 cm superior pancreatic body cystic lesion on abdominal MRI 06/12/2022-follow-up MRI abdomen with and without IV contrast recommended at a 2-year interval. 11.  Iron  deficiency anemia   Disposition: Ms. Mautner is in clinical remission from colon cancer.  She will return for an office visit and CEA in 6 months.  She will continue colonoscopy surveillance with Dr. Willy Harvest.  Coni Deep, MD  07/30/2023  12:08 PM

## 2023-11-05 DIAGNOSIS — R634 Abnormal weight loss: Secondary | ICD-10-CM | POA: Diagnosis not present

## 2023-11-05 DIAGNOSIS — E7800 Pure hypercholesterolemia, unspecified: Secondary | ICD-10-CM | POA: Diagnosis not present

## 2023-11-05 DIAGNOSIS — E1165 Type 2 diabetes mellitus with hyperglycemia: Secondary | ICD-10-CM | POA: Diagnosis not present

## 2023-11-08 ENCOUNTER — Other Ambulatory Visit: Payer: Self-pay | Admitting: Family Medicine

## 2023-11-08 NOTE — Telephone Encounter (Unsigned)
 Copied from CRM [id number]. Topic: Clinical - Medication Refill >> Nov 08, 2023 11:56 AM Tanazia G wrote: Medication: Famotidine  20mg   Has the patient contacted their pharmacy? Yes (Agent: If no, request that the patient contact the pharmacy for the refill. If patient does not wish to contact the pharmacy document the reason why and proceed with request.) (Agent: If yes, when and what did the pharmacy advise?)  This is the patient's preferred pharmacy:  [facility] [id number] [place], [place] - [address] [address] Phone: [phone] Fax: [phone]  Is this the correct pharmacy for this prescription? Yes If no, delete pharmacy and type the correct one.   Has the prescription been filled recently? Yes  Is the patient out of the medication? Yes  Has the patient been seen for an appointment in the last year OR does the patient have an upcoming appointment? Yes  Can we respond through MyChart? Yes  Agent: Please be advised that Rx refills may take up to 3 business days. We ask that you follow-up with your pharmacy.

## 2023-11-09 MED ORDER — FAMOTIDINE 20 MG PO TABS: ORAL_TABLET | ORAL | 1 refills | Status: DC

## 2023-11-12 DIAGNOSIS — D649 Anemia, unspecified: Secondary | ICD-10-CM | POA: Diagnosis not present

## 2023-11-12 DIAGNOSIS — E7800 Pure hypercholesterolemia, unspecified: Secondary | ICD-10-CM | POA: Diagnosis not present

## 2023-11-12 DIAGNOSIS — I10 Essential (primary) hypertension: Secondary | ICD-10-CM | POA: Diagnosis not present

## 2023-11-12 DIAGNOSIS — K219 Gastro-esophageal reflux disease without esophagitis: Secondary | ICD-10-CM | POA: Diagnosis not present

## 2023-11-12 DIAGNOSIS — C189 Malignant neoplasm of colon, unspecified: Secondary | ICD-10-CM | POA: Diagnosis not present

## 2023-11-12 DIAGNOSIS — E1165 Type 2 diabetes mellitus with hyperglycemia: Secondary | ICD-10-CM | POA: Diagnosis not present

## 2023-12-12 DIAGNOSIS — E7800 Pure hypercholesterolemia, unspecified: Secondary | ICD-10-CM | POA: Diagnosis not present

## 2023-12-12 DIAGNOSIS — K219 Gastro-esophageal reflux disease without esophagitis: Secondary | ICD-10-CM | POA: Diagnosis not present

## 2023-12-12 DIAGNOSIS — E1165 Type 2 diabetes mellitus with hyperglycemia: Secondary | ICD-10-CM | POA: Diagnosis not present

## 2023-12-12 DIAGNOSIS — D649 Anemia, unspecified: Secondary | ICD-10-CM | POA: Diagnosis not present

## 2023-12-12 DIAGNOSIS — I10 Essential (primary) hypertension: Secondary | ICD-10-CM | POA: Diagnosis not present

## 2023-12-12 DIAGNOSIS — C189 Malignant neoplasm of colon, unspecified: Secondary | ICD-10-CM | POA: Diagnosis not present

## 2023-12-12 DIAGNOSIS — Z23 Encounter for immunization: Secondary | ICD-10-CM | POA: Diagnosis not present

## 2024-01-17 DIAGNOSIS — Z23 Encounter for immunization: Secondary | ICD-10-CM | POA: Diagnosis not present

## 2024-01-29 ENCOUNTER — Inpatient Hospital Stay: Attending: Oncology | Admitting: Oncology

## 2024-01-29 ENCOUNTER — Encounter: Payer: Self-pay | Admitting: *Deleted

## 2024-01-29 ENCOUNTER — Ambulatory Visit: Payer: Self-pay | Admitting: Oncology

## 2024-01-29 ENCOUNTER — Inpatient Hospital Stay

## 2024-01-29 VITALS — BP 136/72 | HR 81 | Temp 97.8°F | Resp 18 | Ht 63.0 in | Wt 119.2 lb

## 2024-01-29 DIAGNOSIS — K219 Gastro-esophageal reflux disease without esophagitis: Secondary | ICD-10-CM | POA: Diagnosis not present

## 2024-01-29 DIAGNOSIS — D509 Iron deficiency anemia, unspecified: Secondary | ICD-10-CM | POA: Diagnosis not present

## 2024-01-29 DIAGNOSIS — N281 Cyst of kidney, acquired: Secondary | ICD-10-CM | POA: Diagnosis not present

## 2024-01-29 DIAGNOSIS — Z79899 Other long term (current) drug therapy: Secondary | ICD-10-CM | POA: Insufficient documentation

## 2024-01-29 DIAGNOSIS — E119 Type 2 diabetes mellitus without complications: Secondary | ICD-10-CM | POA: Insufficient documentation

## 2024-01-29 DIAGNOSIS — C182 Malignant neoplasm of ascending colon: Secondary | ICD-10-CM | POA: Diagnosis not present

## 2024-01-29 DIAGNOSIS — D122 Benign neoplasm of ascending colon: Secondary | ICD-10-CM | POA: Diagnosis not present

## 2024-01-29 DIAGNOSIS — K5730 Diverticulosis of large intestine without perforation or abscess without bleeding: Secondary | ICD-10-CM | POA: Diagnosis not present

## 2024-01-29 DIAGNOSIS — Z794 Long term (current) use of insulin: Secondary | ICD-10-CM | POA: Diagnosis not present

## 2024-01-29 DIAGNOSIS — I10 Essential (primary) hypertension: Secondary | ICD-10-CM | POA: Diagnosis not present

## 2024-01-29 DIAGNOSIS — K589 Irritable bowel syndrome without diarrhea: Secondary | ICD-10-CM | POA: Insufficient documentation

## 2024-01-29 DIAGNOSIS — J302 Other seasonal allergic rhinitis: Secondary | ICD-10-CM | POA: Insufficient documentation

## 2024-01-29 LAB — CEA (ACCESS): CEA (CHCC): 2.12 ng/mL (ref 0.00–5.00)

## 2024-01-29 NOTE — Progress Notes (Signed)
  [facility] OFFICE PROGRESS NOTE   Diagnosis: Colon cancer  INTERVAL HISTORY:   Donna Murphy returns as scheduled.  She feels well.  Good appetite.  No difficulty with bowel function.  No bleeding.  She was recently started on insulin  for diabetes.  She underwent a colonoscopy 06/11/2023.  There were no polyps.  Objective:  Vital signs in last 24 hours:  Blood pressure 136/72, pulse 81, temperature 97.8 F (36.6 C), resp. rate 18, height 5' 3 (1.6 m), weight 119 lb 3.2 oz (54.1 kg), SpO2 100%.    Lymphatics: No cervical, supraclavicular, axillary, or inguinal nodes Resp: Lungs clear bilaterally Cardio: Regular rate and rhythm GI: No hepatosplenomegaly, no mass, nontender Vascular: No leg edema   Lab Results:  Lab Results  Component Value Date   WBC 7.6 12/20/2022   HGB 14.1 12/20/2022   HCT 44.7 12/20/2022   MCV 87 12/20/2022   PLT 331 12/20/2022   NEUTROABS 4.7 09/27/2022    CMP  Lab Results  Component Value Date   NA 141 12/22/2022   K 3.9 12/22/2022   CL 102 12/22/2022   CO2 24 12/22/2022   GLUCOSE 169 (H) 12/22/2022   BUN 13 12/22/2022   CREATININE 0.69 12/22/2022   CALCIUM  10.0 12/22/2022   PROT 7.4 06/26/2022   ALBUMIN 4.1 06/26/2022   AST 43 (H) 06/26/2022   ALT 45 (H) 06/26/2022   ALKPHOS 67 06/26/2022   BILITOT 0.3 06/26/2022   GFRNONAA >60 07/09/2022   GFRAA 131 02/13/2008    Lab Results  Component Value Date   CEA 2.21 07/30/2023    Medications: I have reviewed the patient's current medications.   Assessment/Plan: Ascending colon cancer, stage I Colonoscopy 05/10/2022-small ulcerated mass in the ascending colon, invasive moderately differentiated adenocarcinoma Normal CEA CTs 05/23/2022-no evidence of metastatic disease.  Indeterminate potentially enhancing lesion anterior aspect of the upper pole of the left kidney MRI abdomen 06/12/2022-small simple renal cyst.  No suspicious renal masses. Laparoscopic right colectomy  07/07/2022-moderately differentiated adenocarcinoma of the ascending colon, pT2 pN0, 0/27 nodes, no lymphovascular perineural invasion, negative resection margins, no tumor deposits, loss of MLH1 and PMS2 expression, MSI-high, MLH1 hypermethylation present 2.  History of colon polyps-sessile serrated polyp and tubular adenoma in the ascending colon 2017 Colonoscopy November 2019-no polyps Colonoscopy 06/11/2023-left-sided diverticulosis, no polyps 3.  Type 2 diabetes 4.  Gastroesophageal reflux disease 5.   Seasonal allergies 6.   Diarrhea illness, colitis?  As a child 7.   IBS 8.   Hypertension 9.   Aortic atherosclerosis, calcifications of the aortic valve on the CT scan from 05/23/2022.  She has been referred to cardiology. 10.  Tiny nonaggressive nonenhancing 0.3 cm superior pancreatic body cystic lesion on abdominal MRI 06/12/2022-follow-up MRI abdomen with and without IV contrast recommended at a 2-year interval. 11.  Iron  deficiency anemia     Disposition: Donna Murphy is in clinical remission from colon cancer.  She will return for an office visit and CEA in 6 months.  She will continue colonoscopy surveillance with Dr. Avram.  Arley Hof, MD  01/29/2024  11:31 AM

## 2024-03-04 ENCOUNTER — Ambulatory Visit

## 2024-03-13 DIAGNOSIS — E1165 Type 2 diabetes mellitus with hyperglycemia: Secondary | ICD-10-CM | POA: Diagnosis not present

## 2024-03-13 DIAGNOSIS — D649 Anemia, unspecified: Secondary | ICD-10-CM | POA: Diagnosis not present

## 2024-03-13 DIAGNOSIS — K219 Gastro-esophageal reflux disease without esophagitis: Secondary | ICD-10-CM | POA: Diagnosis not present

## 2024-03-13 DIAGNOSIS — E7800 Pure hypercholesterolemia, unspecified: Secondary | ICD-10-CM | POA: Diagnosis not present

## 2024-03-13 DIAGNOSIS — I10 Essential (primary) hypertension: Secondary | ICD-10-CM | POA: Diagnosis not present

## 2024-04-30 ENCOUNTER — Other Ambulatory Visit: Payer: Self-pay | Admitting: Family Medicine

## 2024-04-30 ENCOUNTER — Other Ambulatory Visit: Payer: Self-pay | Admitting: Cardiology

## 2024-04-30 NOTE — Telephone Encounter (Signed)
 Pt is due for her yearly exam with PCP (labs prior), please schedule and then route back back to me to refill

## 2024-04-30 NOTE — Telephone Encounter (Signed)
 Called and schedule pt for Cpe / labs

## 2024-05-02 ENCOUNTER — Other Ambulatory Visit: Payer: Self-pay | Admitting: Cardiology

## 2024-05-04 ENCOUNTER — Telehealth: Payer: Self-pay | Admitting: Family Medicine

## 2024-05-04 DIAGNOSIS — E1169 Type 2 diabetes mellitus with other specified complication: Secondary | ICD-10-CM

## 2024-05-04 DIAGNOSIS — Z79899 Other long term (current) drug therapy: Secondary | ICD-10-CM

## 2024-05-04 DIAGNOSIS — I10 Essential (primary) hypertension: Secondary | ICD-10-CM

## 2024-05-04 DIAGNOSIS — E538 Deficiency of other specified B group vitamins: Secondary | ICD-10-CM

## 2024-05-04 DIAGNOSIS — D509 Iron deficiency anemia, unspecified: Secondary | ICD-10-CM

## 2024-05-04 NOTE — Telephone Encounter (Signed)
-----   Message from Veva JINNY Ferrari sent at 05/01/2024  3:43 PM EST ----- Regarding: Lab orders for Wed, 2.4.26 Patient is scheduled for CPX labs, please order future labs, Thanks , Veva

## 2024-05-05 NOTE — Telephone Encounter (Signed)
 Lipids Scheduled

## 2024-05-07 ENCOUNTER — Other Ambulatory Visit

## 2024-05-07 ENCOUNTER — Other Ambulatory Visit: Payer: Self-pay | Admitting: *Deleted

## 2024-05-07 MED ORDER — OMEPRAZOLE 40 MG PO CPDR: 40.0000 mg | DELAYED_RELEASE_CAPSULE | Freq: Every day | ORAL | 0 refills | Status: AC

## 2024-05-09 ENCOUNTER — Other Ambulatory Visit

## 2024-05-09 DIAGNOSIS — E1169 Type 2 diabetes mellitus with other specified complication: Secondary | ICD-10-CM

## 2024-05-09 DIAGNOSIS — E538 Deficiency of other specified B group vitamins: Secondary | ICD-10-CM

## 2024-05-09 DIAGNOSIS — I10 Essential (primary) hypertension: Secondary | ICD-10-CM

## 2024-05-09 LAB — COMPREHENSIVE METABOLIC PANEL WITH GFR
ALT: 17 U/L (ref 3–35)
AST: 17 U/L (ref 5–37)
Albumin: 4.4 g/dL (ref 3.5–5.2)
Alkaline Phosphatase: 54 U/L (ref 39–117)
BUN: 26 mg/dL — ABNORMAL HIGH (ref 6–23)
CO2: 27 meq/L (ref 19–32)
Calcium: 9.8 mg/dL (ref 8.4–10.5)
Chloride: 108 meq/L (ref 96–112)
Creatinine, Ser: 0.6 mg/dL (ref 0.40–1.20)
GFR: 86.57 mL/min
Glucose, Bld: 131 mg/dL — ABNORMAL HIGH (ref 70–99)
Potassium: 4.1 meq/L (ref 3.5–5.1)
Sodium: 144 meq/L (ref 135–145)
Total Bilirubin: 0.4 mg/dL (ref 0.2–1.2)
Total Protein: 7 g/dL (ref 6.0–8.3)

## 2024-05-09 LAB — CBC WITH DIFFERENTIAL/PLATELET
Basophils Absolute: 0 10*3/uL (ref 0.0–0.1)
Basophils Relative: 0.6 % (ref 0.0–3.0)
Eosinophils Absolute: 0.1 10*3/uL (ref 0.0–0.7)
Eosinophils Relative: 1.3 % (ref 0.0–5.0)
HCT: 37.6 % (ref 36.0–46.0)
Hemoglobin: 11.9 g/dL — ABNORMAL LOW (ref 12.0–15.0)
Lymphocytes Relative: 41.3 % (ref 12.0–46.0)
Lymphs Abs: 2.8 10*3/uL (ref 0.7–4.0)
MCHC: 31.8 g/dL (ref 30.0–36.0)
MCV: 79.4 fl (ref 78.0–100.0)
Monocytes Absolute: 0.4 10*3/uL (ref 0.1–1.0)
Monocytes Relative: 5.4 % (ref 3.0–12.0)
Neutro Abs: 3.5 10*3/uL (ref 1.4–7.7)
Neutrophils Relative %: 51.4 % (ref 43.0–77.0)
Platelets: 332 10*3/uL (ref 150.0–400.0)
RBC: 4.73 Mil/uL (ref 3.87–5.11)
RDW: 16 % — ABNORMAL HIGH (ref 11.5–15.5)
WBC: 6.7 10*3/uL (ref 4.0–10.5)

## 2024-05-09 LAB — LIPID PANEL
Cholesterol: 145 mg/dL (ref 28–200)
HDL: 80 mg/dL
LDL Cholesterol: 52 mg/dL (ref 10–99)
NonHDL: 65.01
Total CHOL/HDL Ratio: 2
Triglycerides: 63 mg/dL (ref 10.0–149.0)
VLDL: 12.6 mg/dL (ref 0.0–40.0)

## 2024-05-09 LAB — VITAMIN B12: Vitamin B-12: 411 pg/mL (ref 211–911)

## 2024-05-09 LAB — TSH: TSH: 0.94 u[IU]/mL (ref 0.35–5.50)

## 2024-05-13 ENCOUNTER — Encounter: Admitting: Family Medicine

## 2024-05-14 ENCOUNTER — Ambulatory Visit

## 2024-07-29 ENCOUNTER — Inpatient Hospital Stay: Admitting: Oncology

## 2024-07-29 ENCOUNTER — Inpatient Hospital Stay
